# Patient Record
Sex: Female | Born: 1940 | Race: White | Hispanic: No | Marital: Married | Smoking: Never smoker
Health system: Southern US, Community
[De-identification: ages and names within clinical notes are randomized; demographics above are authoritative.]

## PROBLEM LIST (undated history)

## (undated) DIAGNOSIS — M1990 Unspecified osteoarthritis, unspecified site: Secondary | ICD-10-CM

## (undated) DIAGNOSIS — I10 Essential (primary) hypertension: Secondary | ICD-10-CM

## (undated) DIAGNOSIS — T7840XA Allergy, unspecified, initial encounter: Secondary | ICD-10-CM

## (undated) DIAGNOSIS — Z860101 Personal history of adenomatous and serrated colon polyps: Secondary | ICD-10-CM

## (undated) DIAGNOSIS — K5730 Diverticulosis of large intestine without perforation or abscess without bleeding: Secondary | ICD-10-CM

## (undated) DIAGNOSIS — G4730 Sleep apnea, unspecified: Secondary | ICD-10-CM

## (undated) DIAGNOSIS — K5792 Diverticulitis of intestine, part unspecified, without perforation or abscess without bleeding: Secondary | ICD-10-CM

## (undated) DIAGNOSIS — K219 Gastro-esophageal reflux disease without esophagitis: Secondary | ICD-10-CM

## (undated) DIAGNOSIS — H269 Unspecified cataract: Secondary | ICD-10-CM

## (undated) DIAGNOSIS — I2510 Atherosclerotic heart disease of native coronary artery without angina pectoris: Secondary | ICD-10-CM

## (undated) DIAGNOSIS — Z86010 Personal history of colonic polyps: Secondary | ICD-10-CM

## (undated) DIAGNOSIS — Z87440 Personal history of urinary (tract) infections: Secondary | ICD-10-CM

## (undated) DIAGNOSIS — I739 Peripheral vascular disease, unspecified: Secondary | ICD-10-CM

## (undated) DIAGNOSIS — E785 Hyperlipidemia, unspecified: Secondary | ICD-10-CM

## (undated) DIAGNOSIS — K649 Unspecified hemorrhoids: Secondary | ICD-10-CM

## (undated) HISTORY — DX: Unspecified cataract: H26.9

## (undated) HISTORY — DX: Diverticulitis of intestine, part unspecified, without perforation or abscess without bleeding: K57.92

## (undated) HISTORY — DX: Gastro-esophageal reflux disease without esophagitis: K21.9

## (undated) HISTORY — DX: Unspecified osteoarthritis, unspecified site: M19.90

## (undated) HISTORY — DX: Unspecified hemorrhoids: K64.9

## (undated) HISTORY — DX: Atherosclerotic heart disease of native coronary artery without angina pectoris: I25.10

## (undated) HISTORY — DX: Hyperlipidemia, unspecified: E78.5

## (undated) HISTORY — DX: Diverticulosis of large intestine without perforation or abscess without bleeding: K57.30

## (undated) HISTORY — DX: Sleep apnea, unspecified: G47.30

## (undated) HISTORY — DX: Allergy, unspecified, initial encounter: T78.40XA

## (undated) HISTORY — PX: OTHER SURGICAL HISTORY: SHX169

## (undated) HISTORY — DX: Personal history of urinary (tract) infections: Z87.440

## (undated) HISTORY — PX: ANGIOPLASTY: SHX39

## (undated) HISTORY — DX: Personal history of colonic polyps: Z86.010

## (undated) HISTORY — DX: Personal history of adenomatous and serrated colon polyps: Z86.0101

## (undated) HISTORY — DX: Peripheral vascular disease, unspecified: I73.9

## (undated) HISTORY — DX: Essential (primary) hypertension: I10

## (undated) HISTORY — PX: COLONOSCOPY: SHX174

## (undated) HISTORY — PX: EYE SURGERY: SHX253

---

## 1998-07-31 ENCOUNTER — Other Ambulatory Visit: Admission: RE | Admit: 1998-07-31 | Discharge: 1998-07-31 | Payer: Self-pay | Admitting: Obstetrics and Gynecology

## 1998-08-17 ENCOUNTER — Encounter: Payer: Self-pay | Admitting: Obstetrics and Gynecology

## 1998-08-17 ENCOUNTER — Ambulatory Visit (HOSPITAL_COMMUNITY): Admission: RE | Admit: 1998-08-17 | Discharge: 1998-08-17 | Payer: Self-pay | Admitting: Obstetrics and Gynecology

## 1999-08-27 ENCOUNTER — Encounter: Payer: Self-pay | Admitting: Obstetrics and Gynecology

## 1999-08-27 ENCOUNTER — Encounter: Admission: RE | Admit: 1999-08-27 | Discharge: 1999-08-27 | Payer: Self-pay | Admitting: Obstetrics and Gynecology

## 1999-09-19 ENCOUNTER — Encounter: Payer: Self-pay | Admitting: Internal Medicine

## 1999-10-04 ENCOUNTER — Ambulatory Visit (HOSPITAL_COMMUNITY): Admission: RE | Admit: 1999-10-04 | Discharge: 1999-10-04 | Payer: Self-pay | Admitting: Gastroenterology

## 2000-01-14 ENCOUNTER — Other Ambulatory Visit: Admission: RE | Admit: 2000-01-14 | Discharge: 2000-01-14 | Payer: Self-pay | Admitting: Obstetrics and Gynecology

## 2000-09-03 ENCOUNTER — Encounter: Admission: RE | Admit: 2000-09-03 | Discharge: 2000-09-03 | Payer: Self-pay | Admitting: Obstetrics and Gynecology

## 2000-09-03 ENCOUNTER — Encounter: Payer: Self-pay | Admitting: Obstetrics and Gynecology

## 2001-01-19 ENCOUNTER — Other Ambulatory Visit: Admission: RE | Admit: 2001-01-19 | Discharge: 2001-01-19 | Payer: Self-pay | Admitting: Obstetrics and Gynecology

## 2001-10-13 ENCOUNTER — Encounter: Payer: Self-pay | Admitting: Obstetrics and Gynecology

## 2001-10-13 ENCOUNTER — Encounter: Admission: RE | Admit: 2001-10-13 | Discharge: 2001-10-13 | Payer: Self-pay | Admitting: Internal Medicine

## 2002-10-15 ENCOUNTER — Encounter: Admission: RE | Admit: 2002-10-15 | Discharge: 2002-10-15 | Payer: Self-pay | Admitting: Obstetrics and Gynecology

## 2002-10-15 ENCOUNTER — Encounter: Payer: Self-pay | Admitting: Obstetrics and Gynecology

## 2003-10-20 ENCOUNTER — Encounter: Admission: RE | Admit: 2003-10-20 | Discharge: 2003-10-20 | Payer: Self-pay | Admitting: Obstetrics and Gynecology

## 2003-11-30 ENCOUNTER — Encounter (INDEPENDENT_AMBULATORY_CARE_PROVIDER_SITE_OTHER): Payer: Self-pay | Admitting: *Deleted

## 2003-11-30 ENCOUNTER — Ambulatory Visit (HOSPITAL_COMMUNITY): Admission: RE | Admit: 2003-11-30 | Discharge: 2003-11-30 | Payer: Self-pay | Admitting: Gastroenterology

## 2003-11-30 ENCOUNTER — Encounter (INDEPENDENT_AMBULATORY_CARE_PROVIDER_SITE_OTHER): Payer: Self-pay | Admitting: Specialist

## 2004-10-22 ENCOUNTER — Encounter: Admission: RE | Admit: 2004-10-22 | Discharge: 2004-10-22 | Payer: Self-pay | Admitting: Obstetrics and Gynecology

## 2005-10-28 ENCOUNTER — Encounter: Admission: RE | Admit: 2005-10-28 | Discharge: 2005-10-28 | Payer: Self-pay | Admitting: Obstetrics and Gynecology

## 2006-02-11 ENCOUNTER — Encounter: Payer: Self-pay | Admitting: Internal Medicine

## 2006-11-05 ENCOUNTER — Encounter: Admission: RE | Admit: 2006-11-05 | Discharge: 2006-11-05 | Payer: Self-pay | Admitting: Obstetrics and Gynecology

## 2006-11-11 ENCOUNTER — Inpatient Hospital Stay (HOSPITAL_COMMUNITY): Admission: RE | Admit: 2006-11-11 | Discharge: 2006-11-13 | Payer: Self-pay | Admitting: Cardiology

## 2006-11-27 ENCOUNTER — Encounter (HOSPITAL_COMMUNITY): Admission: RE | Admit: 2006-11-27 | Discharge: 2007-02-25 | Payer: Self-pay | Admitting: Cardiology

## 2007-05-25 ENCOUNTER — Encounter: Admission: RE | Admit: 2007-05-25 | Discharge: 2007-05-25 | Payer: Self-pay | Admitting: Cardiology

## 2007-05-27 ENCOUNTER — Inpatient Hospital Stay (HOSPITAL_COMMUNITY): Admission: RE | Admit: 2007-05-27 | Discharge: 2007-05-28 | Payer: Self-pay | Admitting: Cardiology

## 2007-11-13 ENCOUNTER — Encounter: Admission: RE | Admit: 2007-11-13 | Discharge: 2007-11-13 | Payer: Self-pay | Admitting: Obstetrics and Gynecology

## 2008-02-12 DIAGNOSIS — Z860100 Personal history of colon polyps, unspecified: Secondary | ICD-10-CM | POA: Insufficient documentation

## 2008-02-15 ENCOUNTER — Ambulatory Visit: Payer: Self-pay | Admitting: Internal Medicine

## 2008-02-15 DIAGNOSIS — E785 Hyperlipidemia, unspecified: Secondary | ICD-10-CM | POA: Insufficient documentation

## 2008-02-15 DIAGNOSIS — K5730 Diverticulosis of large intestine without perforation or abscess without bleeding: Secondary | ICD-10-CM | POA: Insufficient documentation

## 2008-02-15 DIAGNOSIS — I2510 Atherosclerotic heart disease of native coronary artery without angina pectoris: Secondary | ICD-10-CM | POA: Insufficient documentation

## 2008-02-15 DIAGNOSIS — Z87448 Personal history of other diseases of urinary system: Secondary | ICD-10-CM | POA: Insufficient documentation

## 2008-02-23 ENCOUNTER — Telehealth: Payer: Self-pay | Admitting: Internal Medicine

## 2008-03-01 ENCOUNTER — Ambulatory Visit: Payer: Self-pay | Admitting: Internal Medicine

## 2008-12-05 ENCOUNTER — Encounter: Admission: RE | Admit: 2008-12-05 | Discharge: 2008-12-05 | Payer: Self-pay | Admitting: Obstetrics and Gynecology

## 2009-03-24 DIAGNOSIS — B0229 Other postherpetic nervous system involvement: Secondary | ICD-10-CM | POA: Insufficient documentation

## 2009-03-28 DIAGNOSIS — M7740 Metatarsalgia, unspecified foot: Secondary | ICD-10-CM | POA: Insufficient documentation

## 2009-12-06 ENCOUNTER — Encounter: Admission: RE | Admit: 2009-12-06 | Discharge: 2009-12-06 | Payer: Self-pay | Admitting: Obstetrics and Gynecology

## 2010-03-16 ENCOUNTER — Ambulatory Visit: Payer: Self-pay | Admitting: Internal Medicine

## 2010-03-16 DIAGNOSIS — K625 Hemorrhage of anus and rectum: Secondary | ICD-10-CM | POA: Insufficient documentation

## 2010-04-16 DIAGNOSIS — H04129 Dry eye syndrome of unspecified lacrimal gland: Secondary | ICD-10-CM | POA: Insufficient documentation

## 2010-04-16 DIAGNOSIS — G4733 Obstructive sleep apnea (adult) (pediatric): Secondary | ICD-10-CM | POA: Insufficient documentation

## 2010-07-07 ENCOUNTER — Emergency Department (HOSPITAL_COMMUNITY)
Admission: EM | Admit: 2010-07-07 | Discharge: 2010-07-07 | Payer: Self-pay | Source: Home / Self Care | Admitting: Emergency Medicine

## 2010-07-08 ENCOUNTER — Encounter: Payer: Self-pay | Admitting: Orthopedic Surgery

## 2010-07-09 DIAGNOSIS — L299 Pruritus, unspecified: Secondary | ICD-10-CM | POA: Insufficient documentation

## 2010-07-17 NOTE — Assessment & Plan Note (Signed)
Summary: DIR COLON/PT ON PLAVIX/FH   History of Present Illness Visit Type: self referral Primary GI MD: Lina Sar MD Primary Provider: Candis Schatz Requesting Provider: n/a Chief Complaint: consult for colonoscopy History of Present Illness:   70 year old white female here to discuss  recall colonoscopy. Her brother is our patient and she decided to switch from Dr Ewing Schlein to [place]. Last colonoscopy was done in June 2005 and showed 2 adenomatous polyps of the colon. Patient has chronic constipation for which she has been taking fiber twice a day, Benefiber in the morning and Metamucil at night.  She has occasional rectal pain and rectal bleeding attributed to anal fissure. Patient has coronary artery disease; status post percutaneous coronary  intervention in May 2008  by Dr Arlice Colt. In  December 2008 she required a total of 3 coronary stents. She has been followed by Dr. Carolanne Grumbling. She is also on aspirin 325 mg daily; Patient's main complaint today is gas and bloating.   GI Review of Systems      Denies abdominal pain, acid reflux, belching, bloating, chest pain, dysphagia with liquids, dysphagia with solids, heartburn, loss of appetite, nausea, vomiting, vomiting blood, weight loss, and  weight gain.      Reports anal fissure,constipation, diverticulosis, and  rectal bleeding.     Denies black tarry stools, change in bowel habit, diarrhea, fecal incontinence, heme positive stool, hemorrhoids, irritable bowel syndrome, jaundice, light color stool, liver problems, and  rectal pain.     Prior Medications Reviewed Using: List Brought by Patient  Updated Prior Medication List: PLAVIX 75 MG TABS (CLOPIDOGREL BISULFATE) one tablet every day ZOCOR 20 MG TABS (SIMVASTATIN) 1 tablet by mouth every day [facility]S ASPIRIN 325 MG TABS (ASPIRIN) one tablet by mouth every day ULTIMATE PROBIOTIC FORMULA  CAPS (LACTOBACILLUS) once daily FISH OIL  OIL (FISH OIL) two tablets twice a day MULTIVITAMINS    TABS (MULTIPLE VITAMIN) once daily NITROLINGUAL DUO PACK 0.4 MG/SPRAY SOLN (NITROGLYCERIN) as needed NORVASC 2.5 MG TABS (AMLODIPINE BESYLATE) 1 tablet by mouth every day RESTASIS 0.05 % EMUL (CYCLOSPORINE)  METAMUCIL 30.9 % POWD (PSYLLIUM) 1 heaping tsp at bedtime  Current Allergies: No known allergies   Past Medical History:    Reviewed history from 02/12/2008 and no changes required:       Current Problems:        COLONIC POLYPS, ADENOMATOUS, HX OF (ICD-V12.72)                Past Surgical History:    Reviewed history and no changes required:       angioplasty/stent 5/08  12/08   Family History:    Reviewed history and no changes required:       Family History of Colon Polyps:       Family History of Heart Disease:        No FH of Colon Cancer:  Social History:    Reviewed history and no changes required:       Occupation: retired       Alcohol Use - yes 2-3 per week       Daily Caffeine Use 4-6 daily       Illicit Drug Use - no       Patient gets regular exercise.   Risk Factors:  Drug use:  no Alcohol use:  yes Exercise:  yes   Review of Systems  The patient denies allergy/sinus, anemia, anxiety-new, arthritis/joint pain, back pain, blood in urine, breast changes/lumps, change in vision, confusion,  cough, coughing up blood, depression-new, fainting, fatigue, fever, headaches-new, hearing problems, heart murmur, heart rhythm changes, itching, menstrual pain, muscle pains/cramps, night sweats, nosebleeds, pregnancy symptoms, shortness of breath, skin rash, sleeping problems, sore throat, swelling of feet/legs, swollen lymph glands, thirst - excessive , urination - excessive , urination changes/pain, urine leakage, vision changes, and voice change.     Vital Signs:  Patient Profile:   70 Years Old Female Height:     63 inches Weight:      162.56 pounds BMI:     28.90 Pulse rate:   68 / minute Pulse rhythm:   regular BP sitting:   126 / 72  (left  arm)  Vitals Entered By: Merri Ray CMA (February 15, 2008 1:16 PM)                  Physical Exam  General:     Well developed, well nourished, no acute distress. Neck:     Supple; no masses or thyromegaly. Lungs:     Clear throughout to auscultation. Heart:     Regular rate and rhythm; no murmurs, rubs,  or bruits. Abdomen:     Soft, nontender and nondistended. No masses, hepatosplenomegaly or hernias noted. Normal bowel sounds. Rectal:     normal rectal tone no obvious hemorrhoids. No tenderness, stool is Hemoccult negative    Impression & Recommendations:  Problem # 1:  COLONIC POLYPS, ADENOMATOUS, HX OF (ICD-V12.72) patient is due for recall colonoscopy. She was last examined in 2005. She has coronary stents and needs to stay on Plavix throughout the procedure. We have discussed the risk of bleeding on Plavix as well as the risk of taking her off Plavix and I believe she would do better staying on Plavix. We will, however discontinue aspirin 5 days prior to the procedure and will hopefully be able to restart it the next day after the procedure. Orders: Colonoscopy (Colon)   Problem # 2:  DIVERTICULOSIS OF COLON (ICD-562.10) diverticulosis of the sigmoid colon based on last colonoscopy in 2005. Patient has functional constipation. I have advised her to use over-the-counter preparations such as Senokot, Colace, or mineral oil 1 tablespoon daily or 2-3  times a week. She agrees that she will try the senokot which is more convienient for her. Orders: Colonoscopy (Colon)    Patient Instructions: 1)  over-the-counter laxatives and mild stimulants 2)  Colonoscopy scheduled 3)  Continue Plavix but discontinue aspirin 5 days prior to colonoscopy    Prescriptions: DULCOLAX 5 MG  TBEC (BISACODYL) Day before procedure take 2 at 3pm and 2 at 8pm.  #4 x 0   Entered by:   Hortense Ramal CMA   Authorized by:   Hart Carwin MD   Signed by:   Hortense Ramal CMA on  02/15/2008   Method used:   Printed then faxed to ...       [facility] (retail)       [address]       [place], [place]  [address]       Ph: [phone]       Fax: [phone]   RxID:   [id number] REGLAN 10 MG  TABS (METOCLOPRAMIDE HCL) As per prep instructions.  #2 x 0   Entered by:   Hortense Ramal CMA   Authorized by:   Hart Carwin MD   Signed by:   Hortense Ramal CMA on 02/15/2008   Method used:  Printed then faxed to ...       [facility] (retail)       [address]       [place], [place]  [address]       Ph: [phone]       Fax: [phone]   RxID:   [id number] MIRALAX   POWD (POLYETHYLENE GLYCOL 3350) As per prep  instructions.  #255gm x 0   Entered by:   Hortense Ramal CMA   Authorized by:   Hart Carwin MD   Signed by:   Hortense Ramal CMA on 02/15/2008   Method used:   Printed then faxed to ...       [facility] (retail)       [address]       [place], [place]  [address]       Ph: [phone]       Fax: [phone]   RxID:   [id number]  ]

## 2010-07-17 NOTE — Procedures (Signed)
Summary: [facility])  Gastroenterology [facility])   Imported By: Hortense Ramal CMA 02/15/2008 17:50:31  _____________________________________________________________________  External Attachment:    Type:   Image     Comment:   External Document

## 2010-07-17 NOTE — Assessment & Plan Note (Signed)
Summary: blood in stool....ch   History of Present Illness Primary GI MD: Lina Sar MD Primary Provider: Candis Schatz Requesting Provider: n/a Chief Complaint: RLQ intermittant abd pain and BRB in stool x 1 year. Pt states she has problems with constipation even when taking Metamucil and stool softeners qd.  History of Present Illness:   This is a 70 year old white female with a history of colon polyps and intermittent bright red blood per rectum for one year. She is on Plavix and aspirin for coronary artery disease and is status post stent placements x 3 in 2008. She has a history of an anal fissure. She experiences rectal pain with bowel movements. She has constipation and difficult evacuation. She has taken Colace 100 mg a day and Metamucil 1 teaspoon twice a day. She tries to avoid OTC laxatives. She uses chewable fiber and takes prunes. Her last colonoscopy in September 2009 showed moderate-to-severe diverticulosis. A prior colonoscopy in 2005 showed 2 tubular adenomas.   GI Review of Systems    Reports abdominal pain.     Location of  Abdominal pain: RLQ.    Denies acid reflux, belching, bloating, chest pain, dysphagia with liquids, dysphagia with solids, heartburn, loss of appetite, nausea, vomiting, vomiting blood, weight loss, and  weight gain.      Reports constipation and  rectal bleeding.     Denies anal fissure, black tarry stools, change in bowel habit, diarrhea, diverticulosis, fecal incontinence, heme positive stool, hemorrhoids, irritable bowel syndrome, jaundice, light color stool, liver problems, and  rectal pain.    Current Medications (verified): 1)  Plavix 75 Mg Tabs (Clopidogrel Bisulfate) .... One Tablet Every Day 2)  Cvs Aspirin 325 Mg Tabs (Aspirin) .... One Tablet By Mouth Every Day 3)  Ultimate Probiotic Formula  Caps (Lactobacillus) .... Once Daily 4)  Fish Oil  Oil (Fish Oil) .... Two Tablets Twice A Day 5)  Multivitamins   Tabs (Multiple Vitamin) .... Once  Daily 6)  Nitrolingual Duo Pack 0.4 Mg/spray Soln (Nitroglycerin) .... As Needed 7)  Norvasc 2.5 Mg Tabs (Amlodipine Besylate) .Marland Kitchen.. 1 Tablet By Mouth Every Day 8)  Restasis 0.05 % Emul (Cyclosporine) 9)  Metamucil 30.9 % Powd (Psyllium) .Marland Kitchen.. 1 Heaping Tsp At Bedtime 10)  Crestor 40 Mg Tabs (Rosuvastatin Calcium) .... One Tablet By Mouth Once Daily 11)  Stool Softener 100 Mg Caps (Docusate Sodium) .... One Capsule By Mouth Once Daily  Allergies (verified): No Known Drug Allergies  Past History:  Past Medical History: UTI'S, HX OF (ICD-V13.00) HYPERLIPIDEMIA (ICD-272.4) DIVERTICULOSIS OF COLON (ICD-562.10) CAD (ICD-414.00) COLONIC POLYPS, ADENOMATOUS, HX OF (ICD-V12.72)  Sleep apnea  Past Surgical History: Reviewed history from 02/15/2008 and no changes required. angioplasty/stent 5/08  12/08  Family History: Family History of Colon Polyps:Brother, Paternal Grandfather Family History of Heart Disease: Father, Brothers, Gearldine Shown, Mother No FH of Colon Cancer:  Social History: Reviewed history from 02/15/2008 and no changes required. Occupation: retired Alcohol Use - yes 2-3 per week Daily Caffeine Use 4-6 daily Illicit Drug Use - no Patient gets regular exercise.  Review of Systems  The patient denies allergy/sinus, anemia, anxiety-new, arthritis/joint pain, back pain, blood in urine, breast changes/lumps, change in vision, confusion, cough, coughing up blood, depression-new, fainting, fatigue, fever, headaches-new, hearing problems, heart murmur, heart rhythm changes, itching, menstrual pain, muscle pains/cramps, night sweats, nosebleeds, pregnancy symptoms, shortness of breath, skin rash, sleeping problems, sore throat, swelling of feet/legs, swollen lymph glands, thirst - excessive , urination - excessive , urination changes/pain, urine leakage,  vision changes, and voice change.         Pertinent positive and negative review of systems were noted in the above HPI. All  other ROS was otherwise negative.   Vital Signs:  Patient profile:   70 year old female Height:      63 inches Weight:      168.50 pounds BMI:     29.96 Pulse rate:   70 / minute Pulse rhythm:   regular BP sitting:   118 / 70  (left arm) Cuff size:   regular  Vitals Entered By: Christie Nottingham CMA Duncan Dull) (March 16, 2010 8:35 AM)  Physical Exam  General:  Well developed, well nourished, no acute distress. Neck:  Supple; no masses or thyromegaly. Lungs:  Clear throughout to auscultation. Heart:  Regular rate and rhythm; no murmurs, rubs,  or bruits. Abdomen:  Soft, nontender and nondistended. No masses, hepatosplenomegaly or hernias noted. Normal bowel sounds. Rectal:  rectal and anoscopic exam reveals a normal perianal area. The anal canal showed minor fissures circumferentially; 1 at 12:00 and 1 at 9:00 which were bleeding slightly when the anoscope was inserted. The were no deep fissures. There were no significant hemorrhoids. The dentate line was somewhat irregular and had hypertrophic papillae. There was no stool in the ampulla. Extremities:  No clubbing, cyanosis, edema or deformities noted.   Impression & Recommendations:  Problem # 1:  RECTAL BLEEDING (ICD-569.3) Patient has low-volume bright red blood per rectum most likely from a superficial anal fissure. Patient has been anticoagulated with Plavix and aspirin. She is up-to-date on her colonoscopy. We will start her on Analpram cream 2.5% b.i.d. and p.r.n. and continue the bowel regimen consisting of Colace 100 mg twice a day, Metamucil 1 teaspoon twice a day, prune juice and fiber.  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  Problem # 3:  CAD (ICD-414.00) Patient is followed by a cardiologist outside of our group. Patient inquired about switching to our cardiology group.  Patient Instructions: 1)  Analpram cream 2.5% b.i.d. and  p.r.n. 2)  Colace 100 mg p.o. b.i.d. 3)  Metamucil 1 teaspoon b.i.d. 4)  High-fiber diet. 5)  Recall colonoscopy September 2014. 6)  Copy sent to : Dr Jessee Avers 7)  The medication list was reviewed and reconciled.  All changed / newly prescribed medications were explained.  A complete medication list was provided to the patient / caregiver. Prescriptions: ANALPRAM-HC 1-2.5 % CREA (HYDROCORTISONE ACE-PRAMOXINE) Apply to rectum two times a day and as needed  #30 grams x 2   Entered by:   Lamona Curl CMA (AAMA)   Authorized by:   Hart Carwin MD   Signed by:   Lamona Curl CMA (AAMA) on 03/16/2010   Method used:   Faxed to ...       [facility] (retail)       [address]       [place], [place]  [address]       Ph: [phone] or [phone]       Fax: [phone]   RxID:   [id number]

## 2010-07-17 NOTE — Progress Notes (Signed)
Summary: needs rx resend   Phone Note Call from Patient Call back at Home Phone [phone]   Caller: Patient Call For: brodie Summary of Call: pt having col on the 15th and rx was sent to the wrong pharmacy. pt states tha tshe never gave [place] that pharmacy; [facility] [phone] Initial call taken by: Tawni Levy,  February 23, 2008 3:06 PM  Follow-up for Phone Call        Rx Called In.  Patient notified.  Apoligized for the inconvience.  Advised her that for some reason, another pharmacy was listed as her preferred.  Patient was very understanding. Follow-up by: Hortense Ramal CMA,  February 23, 2008 4:15 PM      Prescriptions: DULCOLAX 5 MG  TBEC (BISACODYL) Day before procedure take 2 at 3pm and 2 at 8pm.  #4 x 0   Entered by:   Hortense Ramal CMA   Authorized by:   Hart Carwin MD   Signed by:   Hortense Ramal CMA on 02/23/2008   Method used:   Printed then faxed to ...       [facility] (mail-order)       [address]       [place], [place]  [address]       Ph: [phone] or [phone]       Fax: [phone]   RxID:   [id number] REGLAN 10 MG  TABS (METOCLOPRAMIDE HCL) As per prep instructions.  #2 x 0   Entered by:   Hortense Ramal CMA   Authorized by:   Hart Carwin MD   Signed by:   Hortense Ramal CMA on 02/23/2008   Method used:   Printed then faxed to ...       [facility] (mail-order)       [address]       [place], [place]  [address]       Ph: [phone] or [phone]       Fax: [phone]   RxID:   [id number] MIRALAX   POWD (POLYETHYLENE GLYCOL 3350) As per prep  instructions.  #255 grams x 0   Entered by:   Hortense Ramal CMA   Authorized by:   Hart Carwin MD   Signed by:   Hortense Ramal CMA on 02/23/2008   Method used:   Printed then faxed to ...       [facility] (mail-order)       [address]       [place], [place]  [address]       Ph: [phone] or [phone]       Fax: [phone]   RxID:   [id number]

## 2010-07-17 NOTE — Procedures (Signed)
Summary: Colonoscopy   Colonoscopy  Procedure date:  03/01/2008  Findings:      Location:  [facility].    Procedures Next Due Date:    Colonoscopy: 02/2013  Patient Name: [name], [name] MRN:  Procedure Procedures: Colonoscopy CPT: 32951.  Personnel: Endoscopist: Dora L. Juanda Chance, MD.  Exam Location: Exam performed in Outpatient Clinic. Outpatient  Patient Consent: Procedure, Alternatives, Risks and Benefits discussed, consent obtained, from patient. Consent was obtained by the RN.  Indications  Surveillance of: Adenomatous Polyp(s). Initial polypectomy was performed in 2005. 1-2 Polyps were found at Index Exam. Largest polyp removed was 1 to 5 mm. Pathology of worst  polyp: tubular adenoma.  History  Current Medications: Patient is on an anticoagulant. Patient is not currently taking Coumadin.  Pre-Exam Physical: Performed Mar 01, 2008. Entire physical exam was normal.  Comments: Pt. history reviewed/updated, physical exam performed prior to initiation of sedation?yes Exam Exam: Extent of exam reached: Cecum, extent intended: Cecum.  The cecum was identified by appendiceal orifice and IC valve. Duration of exam: time in 8:34 min, out 5:15 min minutes. Colon retroflexion performed. Images taken. ASA Classification: II. Tolerance: good.  Monitoring: Pulse and BP monitoring, Oximetry used. Supplemental O2 given.  Colon Prep Used Miralax for colon prep. Prep results: good.  Sedation Meds: Patient assessed and found to be appropriate for moderate (conscious) sedation. Fentanyl 100 mcg. given IV. Versed 7 mg. given IV.  Findings - NORMAL EXAM: Cecum.  - DIVERTICULOSIS: Descending Colon to Sigmoid Colon. ICD9: Diverticulosis: 562.10. Comments: moderately severe diverticulosis of the left colon.  - NORMAL EXAM: Rectum.   Assessment Normal examination.  Diagnoses: 562.10: Diverticulosis.   Comments: no recurrent polyps Events  Unplanned  Interventions: No intervention was required.  Unplanned Events: There were no complications. Plans Patient Education: Patient given standard instructions for: Constipation.Patient instructed to get routine colonoscopy every 5-7 years.  Comments: follow up with Dr Timothy Lasso prn, resume Plavix and ASA Disposition: After procedure patient sent to recovery. After recovery patient sent home.     cc.   Jonny Ruiz Russo,MD   This report was created from the original endoscopy report, which was reviewed and signed by the above listed endoscopist.

## 2010-07-17 NOTE — Procedures (Signed)
Summary: COLON (Dr. Ewing Schlein)   Colonoscopy  Procedure date:  11/30/2003  Findings:      Location:  [facility]    NAME:  [name], [name]                       ACCOUNT NO.:  [id number]   MEDICAL RECORD NO.:  [id number]                   PATIENT TYPE:  AMB   LOCATION:  ENDO                                 FACILITY[facility]  [facility]   PHYSICIAN:  Petra Kuba, M.D.                 DATE OF BIRTH:  09/25/1940   DATE OF PROCEDURE:  11/30/2003  DATE OF DISCHARGE:                                 OPERATIVE REPORT   PROCEDURE:  Colonoscopy.   ENDOSCOPIST:  Petra Kuba, M.D.   INDICATIONS:  Family history for colon polyps.  Personal history of colon  polyps, due for colonic screening.  Consent was signed after risks,  benefits, methods and options thoroughly discussed multiple times in the  past.   MEDICATIONS USED:  Demerol 70 mcg, Versed 6 mg.   DESCRIPTION OF PROCEDURE:  The rectal inspection was pertinent for external  hemorrhoids.  Digital exam was negative.  A video pediatric adjustable  colonoscope was inserted; easily advanced to the approximate level of the  splenic flexure.  Left-sided diverticula were seen.  To advance into the  cecum requiring some abdominal pressure, but no position changes.  No other  abnormality was seen on insertion.   The cecum was identified by the appendiceal orifice and ileocecal valve.  In  fact, the scope was inserted a short ways into the terminal ileum which was  normal further documentation was obtained.  The scope was slowly withdrawn.  The prep was adequate. There was minimal liquid stool that required washing  and suctioning.  On slow withdrawal through the colon the cecum and  ascending were normal. At approximately the level of the hepatic flexure a  small polyp behind a fold was seen and snared; electrocautery applied.  The  polyp was suctioned through the scope and collected in the trap.   Another mid-transverse small polyp was  seen.  Snare electrocautery applied  and this polyp was suctioned through the scope and collected in the trap;  submitted in the same container.  The scope was further withdrawn.  There  was a tiny descending polyp and a thin distal sigmoid polyp which were hot  biopsied, put in a second container and other than the left-sided  diverticula and some tortuosity no other abnormalities were seen.  The  anorectal posterior retroflexion did confirm some hemorrhoids.  The scope  was inserted up the left side of the colon, the air was suctioned and the  scope removed.  The patient tolerated the procedure well.  There were no  obvious complication.   ENDOSCOPIC DIAGNOSES:  1. Internal and external hemorrhoids.  2. Left-sided diverticula and tortuosity.  3. Two tiny sigmoid and descending polyp hot biopsied.  4. Two tiny to small transverse hepatic flexure polyps  snared.  5. Otherwise normal limits to the terminal ileum.   PLAN:  1. Await pathology to determine future colonic screening, probably 3-5     years.  2. Consider ultrasound or CT scan one time if her pain continues.  3. May give her an antispasmodic like Levsin, Levbid, Bentyl, etc., to try     for her pain and have her seen back p.r.n.; otherwise we will leave     further workup and plans Dr. Purnell Shoemaker and Asencion Islam.  This report was created from the original endoscopy report, which was reviewed and signed by the above listed endoscopist.                                               Petra Kuba, M.D.  PATHOLOGY   MEM/MEDQ  D:  11/30/2003  T:  11/30/2003  Job:  [id number]   cc:   Petra Kuba, M.D.  [address]  [place]  [place] [address]  Fax: [phone]   Lianne Bushy, M.D.  [address]  [place]  [place] [address]  Fax: [phone]   Lanora Manis A. Asencion Islam, M.D.  [address]  [place]  [place] [address]  Fax: [phone]  FINAL DIAGNOSIS    ***MICROSCOPIC EXAMINATION AND DIAGNOSIS***    1. COLON, RIGHT SIDE, POLYP:  TUBULAR ADENOMAS. NO HIGH GRADE   DYSPLASIA OR MALIGNANCY IDENTIFIED.    2. COLON, LEFT SIDE, POLYP: TUBULAR ADENOMAS. NO HIGH GRADE   DYSPLASIA OR MALIGNANCY IDENTIFIED.    mw   Date Reported: 12/02/2003 Beulah Gandy. Luisa Hart, MD   *** Electronically Signed Out By JDP ***    Clinical information   (ac)    specimen(s) obtained   1: Colon, polyp(s), right side   2: Colon, polyp(s), left side    Gross Description   1. Received in formalin are tan, soft tissue fragments that are   submitted in toto. Number: 2   Size: 0.2 and 0.3cm, one block    2. Received in formalin are tan, soft tissue fragments that are   submitted in toto. Number: 3   Size: 0.1 to 0.2cm, one block. (SW:gt) 12/01/03

## 2010-07-17 NOTE — Procedures (Signed)
Summary: [facility])  [facility])   Imported By: Hortense Ramal CMA 02/15/2008 17:49:24  _____________________________________________________________________  External Attachment:    Type:   Image     Comment:   External Document

## 2010-10-18 ENCOUNTER — Telehealth: Payer: Self-pay

## 2010-10-18 NOTE — Telephone Encounter (Addendum)
Mailed Release Of Info. To Pt  10/18/10/km   Release Rec'd back from Pt Via Mail, Faxed to [facility]/Dr.Traci Turner @ [phone] Pt needs to make NP Appt with [place]. 10/24/10/km

## 2010-10-29 ENCOUNTER — Other Ambulatory Visit: Payer: Self-pay | Admitting: Obstetrics and Gynecology

## 2010-10-29 DIAGNOSIS — Z1231 Encounter for screening mammogram for malignant neoplasm of breast: Secondary | ICD-10-CM

## 2010-10-30 NOTE — Discharge Summary (Signed)
NAME:  [name], [name]             ACCOUNT NO.:  [id number]   MEDICAL RECORD NO.:  [id number]          PATIENT TYPE:  OIB   LOCATION:  6529                         FACILITY:  [facility]   PHYSICIAN:  Quintella Reichert, M.D.   DATE OF BIRTH:  [birth year]   DATE OF ADMISSION:  11/11/2006  DATE OF DISCHARGE:  11/13/2006                               DISCHARGE SUMMARY   ADMISSION DIAGNOSIS:  Angina.   DISCHARGE DIAGNOSES:  1. Angina.  Status post cardiac catheterization with PCI (percutaneous      coronary intervention) and drug-eluting stent implantation to the      mid left anterior descending artery and proximal first diagonal.      Stable without angina.  2. Dyslipidemia.   CONSULTATIONS:  None.   PROCEDURES:  1. Nov 11, 2006 - Left heart catheterization, coronary angiography,      and left ventriculography.  2. Nov 12, 2006 - PCI with drug-eluting stent implantation to the mid      LAD and proximal first diagonal.   HOSPITAL COURSE:  [name] is a 70 year old Caucasian female with no  known history of coronary artery disease.  She was admitted to the [facility] on Nov 11, 2006 for a cardiac catheterization procedure  secondary to classic angina symptoms.  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  The patient was noted to have normal LV function.  On the  following day she underwent PCI/drug-eluting stent implantation to the  mid LAD and proximal first diagonal.  The PCI procedure was performed by  Dr. Corliss Marcus and was well tolerated by the patient with a minimal  estimated blood loss.  Typical angina was reproduced with device  insertion and balloon inflation.  The lesion treated was in the mid  anterior descending artery and the proximal portion of a large diagonal.  The anterior descending artery lesion was diffuse and 90% stenotic at  its tightest portion.  Following balloon dilatation and stent  implantation there was no residual stenosis.  In the diagonal branch,  there was focal 95% stenosis.  Following balloon dilatation and stent  implantation there was a 10% residual stenosis.  The patient was started  on Plavix 75 mg daily.  Her aspirin was increased to 325 mg daily.  During this admission she was started on Toprol XL 25 mg daily.  However, that was discontinued prior to discharge secondary to  bradycardia.  A 12-lead EKG post-procedure revealed normal sinus rhythm  with a ventricular rate of 72 beats per minute.  There were no acute  ischemic changes.  On Nov 13, 2006 the patient had no further complaints  of angina, shortness of breath, nausea, vomiting, dizziness, or  diaphoresis after ambulating with cardiac rehab.  She was seen and  examined by Dr. Armanda Magic prior to discharge who was in agreement  with the discharge decision.  Her groin was stable without evidence of  ecchymosis, hematoma, or pseudoaneurysm.  She was discharged to home in  stable condition and was started on Lipitor 10 mg daily secondary to  dyslipidemia.  She was instructed to discontinue Tricor and red yeast  rice.   LABORATORY DATA:  White blood count 11.3, hemoglobin 12.0, hematocrit  35.0, platelets 290,000.  Unfractionated heparin level 0.71.  Sodium  141, potassium 3.8, chloride 107, CO2 26, glucose 100, BUN 7, creatinine  0.89.  Fasting lipid panel - total cholesterol 170, triglycerides 59,  HDL 48, LDL 110.   X-RAYS:  None during this admission.   EKGs as stated in the hospital course.   DISCHARGE MEDICATIONS:  1. Coated aspirin 325 mg daily.  This represented an increase in      dosage.  2. Plavix 75 mg daily.  This represented a new prescription.  A      prescription was provided with refills.  3. Lipitor 10 mg daily.  This represented a new prescription.  A      prescription was provided with refills.  4. Fish oil 1 tablet three times daily.  5. Multivitamin 1 tablet daily.  6.  Calcium plus vitamin D daily.  7. Probiotics.  8. Vitamin C.   DISCHARGE INSTRUCTIONS:  1. Increase activity slowly.  2. No lifting greater than 10 pounds for 1 week.  3. No driving for two days.  4. Continue a low-sodium, low-fat, and low-cholesterol diet.  5. Gently bathe groin area with warm soap and water.  Do not scrub      area.  Avoid straining.  6. Stop any activity that causes chest pain, shortness of breath,      dizziness, sweating, or excessive weakness.  7. Discontinue Tricor and red yeast rice.   FOLLOW UP ARRANGEMENTS:  1. Appointment with physician assistant at [facility] cardiology for groin      check on June 5 at 1 o'clock p.m.  2. Appointment with Dr. Armanda Magic at [facility] cardiology on June 23 at      2 o'clock p.m..  The patient is scheduled to call [phone] if she      is unable to make either of these scheduled appointments.  3. Fasting lipid panel in six weeks on July 10 anytime between 7:30      a.m. and 5:30 p.m..  Nothing to eat or drink by mouth after      midnight the night before with fasting lab work.     ______________________________  Tylene Fantasia, PA    ______________________________  Quintella Reichert, M.D.    RDM/MEDQ  D:  11/13/2006  T:  11/13/2006  Job:  [id number]   cc:   Creola Corn, M.D.

## 2010-10-30 NOTE — Cardiovascular Report (Signed)
NAME:  [name], [name]             ACCOUNT NO.:  [id number]   MEDICAL RECORD NO.:  [id number]          PATIENT TYPE:  OIB   LOCATION:  2854                         FACILITY:  [facility]   PHYSICIAN:  Armanda Magic, M.D.     DATE OF BIRTH:  December 13, 1940   DATE OF PROCEDURE:  11/11/2006  DATE OF DISCHARGE:                            CARDIAC CATHETERIZATION   PROCEDURES:  1. Left heart catheterization.  2. Coronary angiography.  3. Left ventriculography.   OPERATOR:  Armanda Magic, M.D.   COMPLICATIONS:  None.   IV ACCESS:  Via right femoral artery 6-French sheath.   IV MEDICATIONS:  Versed 2 mg IV.   HISTORY:  This is a 70 year old white female who presented recently with  classic exertional anginal symptoms.  No chest pain, but exertional left  arm pain radiating up into her left jaw -- resolving with rest.  She now  presents for cardiac catheterization.   DESCRIPTION OF PROCEDURE:  The patient was brought to cardiac  catheterization laboratory in a fasting nonsedated state.  Informed  consent was obtained.  The patient was connected to continuous heart  rate and pulse oximetry monitoring and blood pressure monitoring.  The  right groin was prepped and draped in a sterile fashion.  Xylocaine 1%  was used for local anesthesia.  Using a modified Seldinger technique, a  6-French sheath was placed in the right femoral artery.  Under  fluoroscopic guidance, a 6-French JL-4 catheter was placed in the left  coronary artery.  Multiple cineangiography films were taken at 30-degree  RAO and 40-degree LAO views.  This catheter was then exchanged out over  a guidewire for a  6-French JR-4 catheter, which was placed under  fluoroscopic guidance into the right coronary artery.  Multiple  cineangiography films were taken at 30-degree RAO and 40-degreee LAO  views.  This catheter was then exchanged out over a guidewire for a 6-  French angled pigtail catheter, which was placed under fluoroscopic  guidance into the left ventricular cavity.   Left ventriculography was performed in the 30-degree RAO view, using a  total of 30 mL of contrast at 15 mL per second.  The catheter was then  pulled back across the aortic valve, with no significant gradient noted.   At the end of the procedure all catheters and sheaths were removed.  Manual compression was performed until adequate hemostasis was obtained.  The patient was transferred back to her room in stable condition.   RESULTS:  1. The left main coronary artery is widely patent and bifurcates to      the left anterior descending artery and left circumflex artery.  2. The left anterior descending artery is widely patent in the      proximal portion, giving rise to a large first diagonal branch --      which has a 99% proximal stenosis; it then bifurcates distally into      2 daughter branches, both of which are widely patent.  Just distal      to the takeoff of the first diagonal there is a 50% narrowing of  the LAD, and an 80% stenosis of the mid LAD.  The proximal to mid      LAD and the rest of the LAD traversed the apex and is widely      patent.  3. The left circumflex is widely patent throughout its course, giving      rise to a first obtuse marginal branch which is widely patent and      bifurcates into 2 daughter branches.  The ongoing left circumflex      traverses the AV groove and is widely patent; gives rise to a      second obtuse marginal branch, which is widely patent and      bifurcates into 2 daughter branches -- both of which are widely      patent.  4. The right coronary artery has a 50% proximal stenosis, and then a      70-80% mid stenosis; it then bifurcates distally to the posterior      descending artery and posterolateral artery -- both of which are      widely patent.  5. Left ventriculography shows normal LV function, EF 60%.  Left      ventricular pressure 156/9 mmHg.  Aortic pressure 142/74 mmHg.    ASSESSMENT:  1. Exertional angina.  2. Two-vessel obstructive coronary disease.  3. Normal left ventricular function.   PLAN:  PCI of the LAD diagonal on Nov 12, 2006 by Dr. Amil Amen.  Plavix,  aspirin, IV heparin drip and nitroglycerin drip.  Toprol XL 25 mg a day,  and check a fasting statin panel and a BMET in the a.m.      Armanda Magic, M.D.  Electronically Signed     TT/MEDQ  D:  11/11/2006  T:  11/12/2006  Job:  [id number]   cc:   Gwen Pounds, MD

## 2010-10-30 NOTE — Cardiovascular Report (Signed)
[name], [name]             ACCOUNT NO.:  [id number]   MEDICAL RECORD NO.:  [id number]          PATIENT TYPE:  INP   LOCATION:  6527                         FACILITY:  [facility]   PHYSICIAN:  Armanda Magic, M.D.     DATE OF BIRTH:  [birth year]   DATE OF PROCEDURE:  05/27/2007  DATE OF DISCHARGE:                            CARDIAC CATHETERIZATION   REFERRING PHYSICIAN:  Gwen Pounds, MD   PROCEDURES:  1. Left heart catheterization.  2. Coronary angiography.  3. Left ventriculography.   OPERATOR:  Armanda Magic, M.D.   INDICATIONS:  Chest pain and coronary disease.   COMPLICATIONS:  None.   IV ACCESS:  Via right femoral artery, 6-French sheath.   IV MEDICATIONS:  Versed 2 mg, fentanyl 25 mcg.   This a 70 year old female with a history of coronary disease status post  PTCA and stenting of the LAD and proximal first diagonal.  She did have  residual borderline obstructive disease in the RCA.  She presented  recently with recurrence of pain, although not her typical angina which  usually occurs in her jaw. This basically it was exertional left arm  pain.  She underwent stress Cardiolite study which showed no inducible  ischemia, but, because of persistence of exertional arm pain, she  presents for cardiac catheterization.   The patient is brought to cardiac catheterization laboratory in the  fasting nonsedated state.  Informed consent was obtained.  The patient  was connected to continuous heart rate and pulse oximetry monitoring,  intermittent blood pressure monitoring.  The right groin was prepped and  draped in a sterile fashion; 1% Xylocaine was used for local anesthesia.  Using modified Seldinger technique, a 6-French sheath was placed in the  right femoral artery.  Under fluoroscopic guidance, a 6-French JL-4  catheter was placed in the left coronary artery.  Multiple cine films  were taken at 30-degree RAO, 40-degree LAO views.  This catheter was  exchanged out over a  guidewire for a 6-French JR-4 catheter which was  placed under fluoroscopic guidance in the right coronary artery.  Multiple cine films were taken at 30-degree RAO, 40-degree LAO views.  This catheter was then exchanged out over a guidewire for a 6-French  angled pigtail catheter which was placed under fluoroscopic guidance in  the left ventricular cavity.  Left ventriculography was performed in the  30-degree RAO view using a total of 30 mL of contrast at 15 mL per  second.  The catheter was then pulled back across the aortic valve with  no significant gradient noted.  At the end procedure, the patient  underwent PCI of the RCA by Dr.  Amil Amen.   The left coronary artery is widely patent.  It bifurcates in left  anterior descending artery and left circumflex artery.   The left anterior descending artery is patent in its proximal portion.  It then gives rise to a first diagonal which has a stent present in the  proximal portion. There appears to be 40-50% in-stent restenosis.  Just  after the takeoff of the diagonal, there is evidence of a stent in the  mid LAD with 30-40% in-stent restenosis.  The ongoing LAD traverses the  apex and is widely patent.   Left circumflex is widely patent throughout its course, giving rise to  two obtuse marginal branches.  The first obtuse marginal branch  bifurcates into two daughter branches and is widely patent. The second  obtuse marginal branch bifurcates into two daughter branches and is  widely patent.  The ongoing circumflex traverses the AV groove and is  widely patent.   The right coronary artery has a 50% proximal narrowing and then a 95%  lesion in the mid RCA. The distal RCA bifurcates into a posterior  descending artery and posterolateral artery both of which are widely  patent.   Aortic pressure 123/69 mmHg, LV pressure 110/12 mmHg. LV function is  normal, EF 65%   ASSESSMENT:  1. One-vessel obstructive coronary disease in the right  coronary      artery.  Patent left anterior descending stent with 30-40% in-stent      restenosis and about 40-50% in-stent restenosis of the diagonal.  2. Normal left ventricular function.  3. Dyslipidemia.   PLAN:  PCI of the RCA per Dr. Amil Amen. Continue aspirin and Plavix.      Armanda Magic, M.D.  Electronically Signed     TT/MEDQ  D:  05/27/2007  T:  05/27/2007  Job:  [id number]   cc:   Gwen Pounds, MD

## 2010-10-30 NOTE — Discharge Summary (Signed)
[name], [name]             ACCOUNT NO.:  [id number]   MEDICAL RECORD NO.:  [id number]          PATIENT TYPE:  INP   LOCATION:  6527                         FACILITY:  [facility]   PHYSICIAN:  Armanda Magic, M.D.     DATE OF BIRTH:  07-16-1940   DATE OF ADMISSION:  05/27/2007  DATE OF DISCHARGE:  05/28/2007                               DISCHARGE SUMMARY   DISCHARGE DIAGNOSES:  1. Coronary artery disease status post drug-eluting stent to the mid      right coronary artery on May 27, 2007, by Dr. Corliss Marcus.  2. Dyslipidemia, resume Statin.  3. Long term medication use.   [name] is a 70 year old female with known coronary artery disease  who underwent PCI of the LAD and the first diagonal in May 2008.  She  had residual borderline obstructive disease of the right coronary  artery.  She was seen in the office on May 26, 2007, for pre-cardiac  catheterization workup.  She had been having more left arm discomfort  and chest aching.  Ultimately, the patient's right cardiac  catheterization was found to have a 90% proximal to mid right coronary  artery lesion and a drug-eluting stent was implanted by Dr. Corliss Marcus.  The patient remained in the hospital overnight and was ready  for discharge to home the following day.   DISCHARGE MEDICATIONS:  1. Simvastatin 20 mg one p.o. daily (restart).  2. The patient is on multiple vitamin supplements including:      a.     Probiotic daily.      b.     Vitamin C daily.      c.     Vitamin D daily.      d.     Calcium daily.      e.     Magnesium daily.  3. Plavix 75 mg a day.  4. Enteric coated aspirin 325 mg a day.  5. Sublingual nitroglycerin p.r.n. chest pain  6. Fish oil as prior to admission.   Clean cath site gently with soap and water, no scrubbing, increase  activity slowly, no lifting over 10 pounds for one week, remain on a low  sodium heart-healthy diet.   Follow up with [name], N.P. for a groin  check on June 04, 2007 at 9 a.m. and then follow up with Dr. Armanda Magic for a followup  visit, post hospital, on July 02, 2007 at 12:30 p.m.      Guy Franco, P.A.      Armanda Magic, M.D.  Electronically Signed    LB/MEDQ  D:  05/28/2007  T:  05/28/2007  Job:  [id number]   cc:   Armanda Magic, M.D.

## 2010-10-30 NOTE — Cardiovascular Report (Signed)
NAME:  [name], [name]             ACCOUNT NO.:  [id number]   MEDICAL RECORD NO.:  [id number]          PATIENT TYPE:  OIB   LOCATION:  4799                         FACILITY:  [facility]   PHYSICIAN:  Francisca December, M.D.  DATE OF BIRTH:  12-27-1940   DATE OF PROCEDURE:  DATE OF DISCHARGE:                            CARDIAC CATHETERIZATION   PROCEDURE PERFORMED:  1. PCI slash drug-eluting stent implantation, mid-LAD.  2. PCI slash drug-eluting stent implantation, proximal first diagonal.   INDICATIONS:  [name] is a 70 year old woman who has presented  with typical angina and a positive Cardiolite study for anterior  ischemia. Dr. French Ana Turner's completed coronary angiography revealing a  subtotal stenosis of the anterior descending artery and a large first  diagonal branch.  She is to undergo catheter-based revascularization at  this time.   PROCEDURE NOTE:  The patient was brought to the cardiac catheterization  laboratory in a fasting state.  The right groin was prepped and draped  in the  usual sterile fashion.  Local anesthesia was obtained with the  infiltration of 1% lidocaine.  A 6-French catheter sheath was inserted  percutaneously into the right femoral artery, utilizing an anterior  approach over guiding J-wire.  The patient then received a 0.75 mg/kg  bolus of bivalirudin, followed by a 1.75 mg kilogram constant infusion.  The resultant ACT was greater than 300 seconds.   A 6-French number 3.5 CLS guiding catheter was advanced into anterior  descending artery without difficulty.  The lesion was primarily stented  using a 3.0/24-mm Medtronic Endeavor intracoronary stent.  This was  inflated to 12 atmospheres for 20 seconds.  The stent balloon was  deflated and removed, and post dilatation performed with a 3.25/20-mm  Quantum Maverick intracoronary balloon.  This was placed within the  stented segment, carefully positioned and inflated 22 atmospheres for 22  seconds.   This balloon was deflated and removed, and following  confirmation of adequate patency and orthogonal views, the guidewire was  withdrawn.  It was re-inserted into the diagonal branch.  Pre-dilatation  of this lesion was performed with a 2.5/15-mm Scimed Maverick  intracoronary balloon.  This was inflated to 6 atmospheres for 45  seconds.  This balloon was deflated and removed, and a 3.0/12-mm  Medtronic endeavor intracoronary drug-eluting stent was then advanced  into place, carefully positioned and deployed a peak pressure of 12  atmospheres for 42 seconds.  Post dilatation was then performed using a  3.0/8-mm Quantum Maverick.  This was carefully positioned within the  stented segment and inflated to 22 atmospheres with 34 seconds.  There  was still residual waste.  I, therefore, exchanged this balloon for a  3.2 5/18-mm Power Sail.  This device was carefully positioned and  inflated to 18 atmospheres for 58 seconds.  Finally, I withdrew the wire  from the diagonal and re-advanced it into the LAD.  The 3.25/20-mm  Quantum Maverick was returned to the circulation and placed within the  more proximal portion of the LAD-stented segment.  It was inflated to 12  atmospheres for 24 seconds and then removed.  Following  confirmation of  adequate patency in orthogonal views, both with and without the  guidewire in place, the guiding catheter was removed.  A right femoral  arteriogram in the 45-degree RAO angulation at documented adequate  anatomy for placement of percutaneous closure device Angio-Seal.  This  resulted in good hemostasis and an intact distal pulse.   Angiography as mentioned.  The lesion treated was in the mid-anterior  descending artery, and the proximal portion of a large diagonal.  The  anterior descending artery lesion was diffuse and 90% stenotic, at its  tightest portion.  Following balloon dilatation and stent implantation,  there was no residual stenosis.  In the  diagonal branch, there was a  focal 95% stenosis.  Following balloon dilatation and stent  implantation, there was a 10% residual stenosis.   FINAL IMPRESSION:  1. Atherosclerotic coronary vascular disease, two-vessel.  2. S/P successful PCI/DE stent implantation, mid-anterior descending      artery and proximal first diagonal branch.  3. Typical angina was reproduced with device insertion and balloon      inflation      Francisca December, M.D.  Electronically Signed     JHE/MEDQ  D:  11/12/2006  T:  11/12/2006  Job:  [id number]

## 2010-10-30 NOTE — Cardiovascular Report (Signed)
NAME:  [name], [name]             ACCOUNT NO.:  [id number]   MEDICAL RECORD NO.:  [id number]          PATIENT TYPE:  INP   LOCATION:  6527                         FACILITY:  [facility]   PHYSICIAN:  Francisca December, M.D.  DATE OF BIRTH:  May 02, 1941   DATE OF PROCEDURE:  DATE OF DISCHARGE:                            CARDIAC CATHETERIZATION   PROCEDURES PERFORMED:  PCI/drug-eluting stent implantation, proximal to  mid-RCA.   INDICATIONS:  [name] is a 70 year old woman with known ASCVD, a  7 to 67-month status PPCI/DE stent in the mid and mid-LAD and proximal  first diagonal.  She has developed recurrent atypical angina pectoris  with left arm/elbow pain.  A Myocardial perfusion study was significant  for ST-segment depression, produced with angina.  However, there were no  perfusion defects.  Dr. French Ana Turner's completed coronary angiography  revealing widely patent LAD and diagonal stents.  However, there is a  new 95% stenosis in the proximal-to-mid right coronary.  This is a  moderate length lesion, which is most severe in it's distal segment.  She is to undergo catheter-based revascularization at this time.   PROCEDURE NOTE:  Via the previously placed 6-French catheter sheath, a 6-  [place] LIMA catheter was advanced to the ascending aorta where the right  coronary os was engaged.  The patient received 0.75 mg/kg bolus of  bivalirudin, followed by a 1.25 mg/kg per hour constant infusion.  The  resultant ACT was 240 seconds.  A 0.014-inches Luge intracoronary  guidewire was passed across the lesion, without difficulty.  Initial  balloon dilatation was performed with 3.0/15-mm Scimed Maverick  intracoronary balloon.  This was inflated to 6 to 8 atmospheres for not  greater than 1 minute and successfully fully dilated the lesion.  This  balloon was deflated and removed, and a 3.0/23-mm Promus intracoronary  drug-eluting stent was then advanced into place, carefully positioned  and  deployed a peak pressure of 14 atmospheres for 25 seconds.  The  stent balloon was deflated and removed, and a 3.25/15-mm Quantum  Maverick intracoronary balloon dilatation catheter was advanced into  place, carefully positioned to the more distal segment of the stent and  inflated to 16 atmospheres for 20 seconds.  It was deflated and moved  more proximally, and re-inflated to 16 atmospheres again for 20 seconds.  Following confirmation of adequate patency in orthogonal views, both  with and without the guidewire in place, the guiding catheter was  removed.  A right femoral arteriogram in the 45-degree RAO angulation  via the catheter sheath by hand injection documented adequate anatomy  for placement of the percutaneous closure device Angio-Seal.  This was  subsequent deployed with good hemostasis and an intact distal pulse.  The patient is transported to the recovery area in stable condition.   ANGIOGRAPHY:  As mentioned, the lesion treated was in the proximal and  mid-portion of the right coronary.  It extended over approximately 18 to  20 mm but was most severely stenotic in the distal segment.  There was a  mild Shepherd's [place] in the proximal right coronary.  Following  balloon dilatation  and stent implantation, there was no residual  stenosis seen and good distal flow throughout.   FINAL IMPRESSION:  1. Atherosclerotic coronary disease, two vessel.  2. S/P successful percutaneous coronary intervention/drug-eluting      stent implantation proximal and mid-right      coronary artery.  3. Typical angina with both left arm and chest discomfort was      reproduced with device insertion and balloon inflation, along with      clear ST-segment elevation.  This resolved at the completion of      procedure.      Francisca December, M.D.  Electronically Signed     JHE/MEDQ  D:  05/27/2007  T:  05/27/2007  Job:  [id number]

## 2010-11-01 NOTE — Telephone Encounter (Signed)
Records received from [facility] gave to [facility] 11/01/10/km

## 2010-11-02 NOTE — Op Note (Signed)
NAME:  [name], [name]                       ACCOUNT NO.:  [id number]   MEDICAL RECORD NO.:  [id number]                   PATIENT TYPE:  AMB   LOCATION:  ENDO                                 FACILITY:  [facility]   PHYSICIAN:  Petra Kuba, M.D.                 DATE OF BIRTH:  05/16/1941   DATE OF PROCEDURE:  11/30/2003  DATE OF DISCHARGE:                                 OPERATIVE REPORT   PROCEDURE:  Colonoscopy.   ENDOSCOPIST:  Petra Kuba, M.D.   INDICATIONS:  Family history for colon polyps.  Personal history of colon  polyps, due for colonic screening.  Consent was signed after risks,  benefits, methods and options thoroughly discussed multiple times in the  past.   MEDICATIONS USED:  Demerol 70 mcg, Versed 6 mg.   DESCRIPTION OF PROCEDURE:  The rectal inspection was pertinent for external  hemorrhoids.  Digital exam was negative.  A video pediatric adjustable  colonoscope was inserted; easily advanced to the approximate level of the  splenic flexure.  Left-sided diverticula were seen.  To advance into the  cecum requiring some abdominal pressure, but no position changes.  No other  abnormality was seen on insertion.   The cecum was identified by the appendiceal orifice and ileocecal valve.  In  fact, the scope was inserted a short ways into the terminal ileum which was  normal further documentation was obtained.  The scope was slowly withdrawn.  The prep was adequate. There was minimal liquid stool that required washing  and suctioning.  On slow withdrawal through the colon the cecum and  ascending were normal. At approximately the level of the hepatic flexure a  small polyp behind a fold was seen and snared; electrocautery applied.  The  polyp was suctioned through the scope and collected in the trap.   Another mid-transverse small polyp was seen.  Snare electrocautery applied  and this polyp was suctioned through the scope and collected in the trap;  submitted in the  same container.  The scope was further withdrawn.  There  was a tiny descending polyp and a thin distal sigmoid polyp which were hot  biopsied, put in a second container and other than the left-sided  diverticula and some tortuosity no other abnormalities were seen.  The  anorectal posterior retroflexion did confirm some hemorrhoids.  The scope  was inserted up the left side of the colon, the air was suctioned and the  scope removed.  The patient tolerated the procedure well.  There were no  obvious complication.   ENDOSCOPIC DIAGNOSES:  1. Internal and external hemorrhoids.  2. Left-sided diverticula and tortuosity.  3. Two tiny sigmoid and descending polyp hot biopsied.  4. Two tiny to small transverse hepatic flexure polyps snared.  5. Otherwise normal limits to the terminal ileum.   PLAN:  1. Await pathology to determine future colonic screening, probably 3-5  years.  2. Consider ultrasound or CT scan one time if her pain continues.  3. May give her an antispasmodic like Levsin, Levbid, Bentyl, etc., to try     for her pain and have her seen back p.r.n.; otherwise we will leave     further workup and plans Dr. Purnell Shoemaker and Asencion Islam.                                               Petra Kuba, M.D.    MEM/MEDQ  D:  11/30/2003  T:  11/30/2003  Job:  [id number]   cc:   Petra Kuba, M.D.  [address]  [place]  [place] [address]  Fax: [phone]   Lianne Bushy, M.D.  [address]  [place]  [place] [address]  Fax: [phone]   Lanora Manis A. Asencion Islam, M.D.  [address]  [place]  [place] [address]  Fax: [phone]

## 2010-11-02 NOTE — Procedures (Signed)
[place]  Patient:    [name], [name]                    MRN: [id number] Proc. Date: 10/04/99 Adm. Date:  [id number] Attending:  Nelda Marseille CC:         Sheppard Plumber. Earlene Plater, M.D.             Malachi Pro. Ambrose Mantle, M.D.             Maricela Bo, M.D.                           Procedure Report  PROCEDURE:  Colonoscopy with biopsy.  ENDOSCOPIST:  Petra Kuba, M.D.  INDICATIONS:  Patient with bright red blood per rectum, due for colonic screening. Family history of colon polyps.  Consent was signed after risks, benefits, methods, and options were thoroughly discussed in the office.  MEDICINES USED:  Demerol 60 and Versed 6.  DESCRIPTION OF PROCEDURE:  Rectal inspection is pertinent for small external hemorrhoids.  Digital exam was negative.  The pediatric video colonoscope was inserted, and fairly easily advanced around the colon to the cecum.  No abnormalities were seen on insertion except for some left-sided diverticuli. To advance to the cecum did require some left lower quadrant pressure, but no position changes.  The cecum was identified by the appendiceal orifice and the ileocecal  valve.  In fact, the scope was inserted a short ways into the terminal ileum which was normal.  Photo documentation was obtained and the scope was slowly withdrawn. ____ and cecum were normal.  In the mid ascending, a tiny 1-2 mm polyp was seen  and was cold biopsied x 1.  The scope was further withdrawn.  Transverse was normal.  In the mid descending, another tiny polyp was seen and was cold biopsied x 2.  There was some rare left-sided diverticuli.  The prep was adequate.  There as minimal liquid stool that required washing and suctioning.  In the sigmoid, a 2 mm polyp was seen and was cold biopsied x 3.  The scope was further withdrawn back to the rectum.  No other abnormalities were seen.  Once back in the rectum, the scope was retroflexed  and pertinent for some internal hemorrhoids.  The scope was straightened and readvanced a short ways up the sigmoid.  Air was suctioned and the scope was slowly withdrawn.  The patient tolerated the procedure well.  There was no obvious or immediate complication.  ENDOSCOPIC DIAGNOSES: 1. Internal and external hemorrhoids. 2. Left occasional diverticuli. 3. Three tiny to small polyps in the sigmoid distally, mid descending, and mid    ascending, status post cold biopsy. 4. Otherwise within normal limits to the terminal ileum, await pathology, but    probably recheck colonic screening in five years.  Otherwise, gastrointestinal    follow up p.r.n., and return care to [facility]. Ambrose Mantle, M.D. and Maricela Bo,    M.D. for the customary rectals and guaiacs, and other health care maintenance. DD:  10/04/99 TD:  10/05/99 Job: [id number]

## 2010-11-08 ENCOUNTER — Encounter: Payer: Self-pay | Admitting: *Deleted

## 2010-11-14 ENCOUNTER — Encounter: Payer: Self-pay | Admitting: Cardiovascular Disease

## 2010-11-14 ENCOUNTER — Ambulatory Visit (INDEPENDENT_AMBULATORY_CARE_PROVIDER_SITE_OTHER): Payer: Medicare Other | Admitting: Cardiovascular Disease

## 2010-11-14 ENCOUNTER — Other Ambulatory Visit: Payer: Medicare Other | Admitting: *Deleted

## 2010-11-14 VITALS — BP 130/86 | HR 69 | Resp 16 | Ht 62.0 in | Wt 170.8 lb

## 2010-11-14 DIAGNOSIS — E785 Hyperlipidemia, unspecified: Secondary | ICD-10-CM

## 2010-11-14 DIAGNOSIS — I2510 Atherosclerotic heart disease of native coronary artery without angina pectoris: Secondary | ICD-10-CM

## 2010-11-14 MED ORDER — ASPIRIN 81 MG PO TABS: 81.0000 mg | ORAL_TABLET | Freq: Every day | ORAL | Status: DC

## 2010-11-14 NOTE — Patient Instructions (Signed)
Your physician recommends that you schedule a follow-up appointment in: 6 months with Dr. Clifton James  Fasting blood work Friday anytime after 8:30 am\  Your physician has recommended you make the following change in your medication: DECREASE ASPIRIN to 81 mg daily.

## 2010-11-14 NOTE — Progress Notes (Signed)
History of Present Illness: 70 yo WF with h/o CAD with previous drug eluting stents mid LAD, diagonal and proximal/mid RCA as well as HTN, hyperlipidemia, obstructive sleep apnea here today to establish cardiac care. She has been followed in the past by Dr. Cherlyn Labella with [facility]. Drug eluting stents LAD and diagonal May 2008, followed by DES RCA in December 2008. Stress test February 2011 in [facility] with no ischemia, normal LVEF at 88%.   She tells me that she has been doing well. Energy level is down a little. No chest pain or SOB. No palpitations, near syncope or syncope.   Past Medical History  Diagnosis Date  . History of recurrent UTIs   . Hyperlipidemia   . Diverticulosis of colon   . CAD (coronary artery disease)   . Hx of adenomatous colonic polyps   . Sleep apnea     Past Surgical History  Procedure Date  . Angioplasty 5/08 12/08     stent     Current Outpatient Prescriptions  Medication Sig Dispense Refill  . amLODipine (NORVASC) 2.5 MG tablet Take 2.5 mg by mouth daily.        Marland Kitchen aspirin 325 MG tablet Take 325 mg by mouth daily.        . clopidogrel (PLAVIX) 75 MG tablet Take 75 mg by mouth daily.        . cycloSPORINE (RESTASIS) 0.05 % ophthalmic emulsion Place 1 drop into both eyes every 12 (twelve) hours.       . fish oil-omega-3 fatty acids 1000 MG capsule Take 2 capsules by mouth 2 (two) times daily.       . hydrocortisone-pramoxine (ANALPRAM-HC) 2.5-1 % rectal cream Place rectally 2 (two) times daily as needed.       . Lactobacillus CAPS Take by mouth daily.        . multivitamin (THERAGRAN) per tablet Take 1 tablet by mouth daily.        . nitroGLYCERIN (NITROLINGUAL DUO PACK) 0.4 MG/SPRAY spray Place 1 spray under the tongue every 5 (five) minutes as needed.        . Psyllium (METAMUCIL) 30.9 % POWD Take by mouth 1 dose over 24 hours.        . rosuvastatin (CRESTOR) 40 MG tablet Take 20 mg by mouth daily.       Marland Kitchen DISCONTD: docusate sodium  (COLACE) 100 MG capsule Take 100 mg by mouth daily.          No Known Allergies  History   Social History  . Marital Status: Married    Spouse Name: N/A    Number of Children: N/A  . Years of Education: N/A   Occupational History  . retired     Social History Main Topics  . Smoking status: Never Smoker   . Smokeless tobacco: Not on file  . Alcohol Use: 1.0 - 1.5 oz/week    2-3 drink(s) per week  . Drug Use: No  . Sexually Active: Not on file   Other Topics Concern  . Not on file   Social History Narrative  . No narrative on file    Family History  Problem Relation Age of Onset  . Colon polyps Brother   . Colon polyps Paternal Grandfather   . Heart disease Father   . Heart disease Brother   . Heart disease Mother   . Heart disease Other     Grandmother   . Cancer Neg Hx  colon    Review of Systems:  As stated in the HPI and otherwise negative.   BP 130/86  Pulse 69  Resp 16  Ht 5\' 2"  (1.575 m)  Wt 170 lb 12.8 oz (77.474 kg)  BMI 31.24 kg/m2  Physical Examination: General: Well developed, well nourished, NAD HEENT: OP clear, mucus membranes moist SKIN: warm, dry. No rashes. Neuro: No focal deficits Musculoskeletal: Muscle strength 5/5 all ext Psychiatric: Mood and affect normal Neck: No JVD, no carotid bruits, no thyromegaly, no lymphadenopathy. Lungs:Clear bilaterally, no wheezes, rhonci, crackles Cardiovascular: Regular rate and rhythm. No murmurs, gallops or rubs. Abdomen:Soft. Bowel sounds present. Non-tender.  Extremities: No lower extremity edema. Pulses are 2 + in the bilateral DP/PT.  EKG:NSR, rate 69 bpm. Normal EKG

## 2010-11-14 NOTE — Assessment & Plan Note (Signed)
Stable. Continue ASA, Plavix and statin. Will decrease ASA to 81 mg po Qdaily.

## 2010-11-14 NOTE — Assessment & Plan Note (Signed)
Continue statin. Check fasting lipids and LFTs this week.

## 2010-11-16 ENCOUNTER — Other Ambulatory Visit (INDEPENDENT_AMBULATORY_CARE_PROVIDER_SITE_OTHER): Payer: Medicare Other | Admitting: *Deleted

## 2010-11-16 DIAGNOSIS — E785 Hyperlipidemia, unspecified: Secondary | ICD-10-CM

## 2010-11-16 DIAGNOSIS — I2510 Atherosclerotic heart disease of native coronary artery without angina pectoris: Secondary | ICD-10-CM

## 2010-11-16 LAB — CBC WITH DIFFERENTIAL/PLATELET
Basophils Absolute: 0.1 10*3/uL (ref 0.0–0.1)
Basophils Relative: 0.6 % (ref 0.0–3.0)
Eosinophils Absolute: 0.2 10*3/uL (ref 0.0–0.7)
Eosinophils Relative: 2.9 % (ref 0.0–5.0)
HCT: 39.2 % (ref 36.0–46.0)
Hemoglobin: 13.3 g/dL (ref 12.0–15.0)
Lymphocytes Relative: 47.4 % — ABNORMAL HIGH (ref 12.0–46.0)
Lymphs Abs: 4 10*3/uL (ref 0.7–4.0)
MCHC: 33.9 g/dL (ref 30.0–36.0)
MCV: 92.2 fl (ref 78.0–100.0)
Monocytes Absolute: 0.6 10*3/uL (ref 0.1–1.0)
Monocytes Relative: 6.6 % (ref 3.0–12.0)
Neutro Abs: 3.6 10*3/uL (ref 1.4–7.7)
Neutrophils Relative %: 42.5 % — ABNORMAL LOW (ref 43.0–77.0)
Platelets: 302 10*3/uL (ref 150.0–400.0)
RBC: 4.25 Mil/uL (ref 3.87–5.11)
RDW: 13.7 % (ref 11.5–14.6)
WBC: 8.4 10*3/uL (ref 4.5–10.5)

## 2010-11-16 LAB — BASIC METABOLIC PANEL
BUN: 17 mg/dL (ref 6–23)
CO2: 24 mEq/L (ref 19–32)
Calcium: 9.2 mg/dL (ref 8.4–10.5)
Chloride: 108 mEq/L (ref 96–112)
Creatinine, Ser: 0.9 mg/dL (ref 0.4–1.2)
GFR: 69.17 mL/min (ref >60.00)
Glucose, Bld: 87 mg/dL (ref 70–99)
Potassium: 4.5 mEq/L (ref 3.5–5.1)
Sodium: 139 mEq/L (ref 135–145)

## 2010-11-16 LAB — LIPID PANEL
Cholesterol: 121 mg/dL (ref 0–200)
HDL: 51.3 mg/dL (ref >39.00)
LDL Cholesterol: 50 mg/dL (ref 0–99)
Total CHOL/HDL Ratio: 2
Triglycerides: 97 mg/dL (ref 0.0–149.0)
VLDL: 19.4 mg/dL (ref 0.0–40.0)

## 2010-11-16 LAB — HEPATIC FUNCTION PANEL
ALT: 24 U/L (ref 0–35)
AST: 27 U/L (ref 0–37)
Albumin: 4 g/dL (ref 3.5–5.2)
Alkaline Phosphatase: 55 U/L (ref 39–117)
Bilirubin, Direct: 0.1 mg/dL (ref 0.0–0.3)
Total Bilirubin: 0.8 mg/dL (ref 0.3–1.2)
Total Protein: 6.5 g/dL (ref 6.0–8.3)

## 2010-11-20 ENCOUNTER — Telehealth: Payer: Self-pay | Admitting: Cardiovascular Disease

## 2010-11-20 NOTE — Telephone Encounter (Signed)
Patient is aware of test/lab results.  

## 2010-11-20 NOTE — Telephone Encounter (Signed)
Returning your call from this afternoon.

## 2010-12-10 ENCOUNTER — Ambulatory Visit
Admission: RE | Admit: 2010-12-10 | Discharge: 2010-12-10 | Disposition: A | Discharge status: Home / Self Care | Payer: Medicare Other | Source: Ambulatory Visit | Attending: Obstetrics and Gynecology | Admitting: Obstetrics and Gynecology

## 2010-12-10 DIAGNOSIS — Z1231 Encounter for screening mammogram for malignant neoplasm of breast: Secondary | ICD-10-CM

## 2011-01-01 DIAGNOSIS — M25571 Pain in right ankle and joints of right foot: Secondary | ICD-10-CM | POA: Insufficient documentation

## 2011-01-09 ENCOUNTER — Other Ambulatory Visit: Payer: Self-pay | Admitting: Cardiology

## 2011-01-09 ENCOUNTER — Telehealth: Payer: Self-pay | Admitting: Cardiovascular Disease

## 2011-01-09 DIAGNOSIS — I2510 Atherosclerotic heart disease of native coronary artery without angina pectoris: Secondary | ICD-10-CM

## 2011-01-09 MED ORDER — AMLODIPINE BESYLATE 2.5 MG PO TABS: 2.5000 mg | ORAL_TABLET | Freq: Every day | ORAL | Status: DC

## 2011-01-09 MED ORDER — CLOPIDOGREL BISULFATE 75 MG PO TABS: 75.0000 mg | ORAL_TABLET | Freq: Every day | ORAL | Status: DC

## 2011-01-09 NOTE — Telephone Encounter (Signed)
Refills resent.

## 2011-01-09 NOTE — Telephone Encounter (Signed)
Refills sent to pharmacy. 

## 2011-01-09 NOTE — Telephone Encounter (Signed)
Pt has transferred to dr Clifton James and needs refill of meds her last cardiologist precribed, plavix 75 mg, generic and norvasc  2.5 mg at [facility]

## 2011-03-25 LAB — BASIC METABOLIC PANEL
BUN: 12
CO2: 26
Calcium: 8.7
Chloride: 112
Creatinine, Ser: 0.91
GFR calc Af Amer: >60
GFR calc non Af Amer: >60
Glucose, Bld: 102 — ABNORMAL HIGH
Potassium: 3.7
Sodium: 142

## 2011-03-25 LAB — CBC
HCT: 39.1
Hemoglobin: 13.2
MCHC: 33.7
MCV: 91.4
Platelets: 282
RBC: 4.28
RDW: 13.9
WBC: 9.6

## 2011-04-19 DIAGNOSIS — H3530 Unspecified macular degeneration: Secondary | ICD-10-CM | POA: Insufficient documentation

## 2011-04-19 DIAGNOSIS — E669 Obesity, unspecified: Secondary | ICD-10-CM | POA: Insufficient documentation

## 2011-04-19 DIAGNOSIS — M1990 Unspecified osteoarthritis, unspecified site: Secondary | ICD-10-CM | POA: Insufficient documentation

## 2011-04-19 DIAGNOSIS — R32 Unspecified urinary incontinence: Secondary | ICD-10-CM | POA: Insufficient documentation

## 2011-04-19 DIAGNOSIS — E663 Overweight: Secondary | ICD-10-CM | POA: Insufficient documentation

## 2011-04-22 ENCOUNTER — Ambulatory Visit (INDEPENDENT_AMBULATORY_CARE_PROVIDER_SITE_OTHER): Payer: Medicare Other | Admitting: Cardiovascular Disease

## 2011-04-22 ENCOUNTER — Encounter: Payer: Self-pay | Admitting: Cardiovascular Disease

## 2011-04-22 VITALS — BP 139/90 | HR 67 | Ht 62.0 in | Wt 170.0 lb

## 2011-04-22 DIAGNOSIS — E785 Hyperlipidemia, unspecified: Secondary | ICD-10-CM

## 2011-04-22 DIAGNOSIS — I2510 Atherosclerotic heart disease of native coronary artery without angina pectoris: Secondary | ICD-10-CM

## 2011-04-22 MED ORDER — ROSUVASTATIN CALCIUM 40 MG PO TABS: 40.0000 mg | ORAL_TABLET | Freq: Every day | ORAL | Status: DC

## 2011-04-22 NOTE — Assessment & Plan Note (Signed)
Recent arm pain c/w her angina. Will arrange treadmill stress test.

## 2011-04-22 NOTE — Assessment & Plan Note (Signed)
Her LDL in June 2012 was 50 on Crestor 40 mg po Qdaily. Her Crestor was decreased to 20 mg per day and now her LDL April 17, 2011 in primary care was 84, HDL was 50. Will increase Crestor 40 mg po once daily.

## 2011-04-22 NOTE — Progress Notes (Signed)
History of Present Illness:70 yo WF with h/o CAD with previous drug eluting stents mid LAD, diagonal and proximal/mid RCA as well as HTN, hyperlipidemia, obstructive sleep apnea here today to establish cardiac care. She has been followed in the past by Dr. Cherlyn Labella with [facility]. Drug eluting stents LAD and diagonal May 2008, followed by DES RCA in December 2008. Stress test February 2011 in [facility] with no ischemia, normal LVEF at 88%.   She is here for cardiac follow up. She describes having some left arm pain with walking. This does not happen every time she walks. Her prior angina was left arm pain. No SOB. No palpitations, near syncope or syncope.   Her primary care is Dr. Timothy Lasso. She is on CPAP.    Past Medical History  Diagnosis Date  . History of recurrent UTIs   . Hyperlipidemia   . Diverticulosis of colon   . CAD (coronary artery disease)   . Hx of adenomatous colonic polyps   . Sleep apnea     Past Surgical History  Procedure Date  . Angioplasty 5/08 12/08     stent   . Right shoulder surgery   . Cataract left   . Vitrectomy left eye     Current Outpatient Prescriptions  Medication Sig Dispense Refill  . amLODipine (NORVASC) 2.5 MG tablet Take 1 tablet (2.5 mg total) by mouth daily.  30 tablet  8  . aspirin 81 MG tablet Take 1 tablet (81 mg total) by mouth daily.  30 tablet  0  . clopidogrel (PLAVIX) 75 MG tablet Take 1 tablet (75 mg total) by mouth daily.  30 tablet  8  . cycloSPORINE (RESTASIS) 0.05 % ophthalmic emulsion Place 1 drop into both eyes every 12 (twelve) hours.       . fish oil-omega-3 fatty acids 1000 MG capsule Take 2 capsules by mouth 2 (two) times daily.       . hydrocortisone-pramoxine (ANALPRAM-HC) 2.5-1 % rectal cream Place rectally 2 (two) times daily as needed.       . Lactobacillus CAPS Take by mouth daily.        . multivitamin (THERAGRAN) per tablet Take 1 tablet by mouth daily.        . nitroGLYCERIN (NITROLINGUAL DUO  PACK) 0.4 MG/SPRAY spray Place 1 spray under the tongue every 5 (five) minutes as needed.        . Psyllium (METAMUCIL) 30.9 % POWD Take by mouth 1 dose over 24 hours.        . rosuvastatin (CRESTOR) 20 MG tablet Take 20 mg by mouth daily.          No Known Allergies  History   Social History  . Marital Status: Married    Spouse Name: N/A    Number of Children: 0  . Years of Education: N/A   Occupational History  . retired     Social History Main Topics  . Smoking status: Never Smoker   . Smokeless tobacco: Not on file  . Alcohol Use: 1.0 - 1.5 oz/week    2-3 drink(s) per week  . Drug Use: No  . Sexually Active: Not on file   Other Topics Concern  . Not on file   Social History Narrative  . No narrative on file    Family History  Problem Relation Age of Onset  . Colon polyps Brother   . Heart disease Brother   . Colon polyps Paternal Grandfather   . Heart disease  Father   . Heart disease Brother   . Heart disease Mother   . Heart disease Other     Grandmother   . Cancer Neg Hx     colon    Review of Systems:  As stated in the HPI and otherwise negative.   BP 139/90  Pulse 67  Ht 5\' 2"  (1.575 m)  Wt 170 lb (77.111 kg)  BMI 31.09 kg/m2  Physical Examination: General: Well developed, well nourished, NAD HEENT: OP clear, mucus membranes moist SKIN: warm, dry. No rashes. Neuro: No focal deficits Musculoskeletal: Muscle strength 5/5 all ext Psychiatric: Mood and affect normal Neck: No JVD, no carotid bruits, no thyromegaly, no lymphadenopathy. Lungs:Clear bilaterally, no wheezes, rhonci, crackles Cardiovascular: Regular rate and rhythm. No murmurs, gallops or rubs. Abdomen:Soft. Bowel sounds present. Non-tender.  Extremities: No lower extremity edema. Pulses are 2 + in the bilateral DP/PT.

## 2011-04-22 NOTE — Patient Instructions (Signed)
Your physician wants you to follow-up in:12 months.  You will receive a reminder letter in the mail two months in advance. If you don't receive a letter, please call our office to schedule the follow-up appointment.  Your physician has requested that you have an exercise tolerance test. For further information please visit [email or web address] Please also follow instruction sheet, as given. To be done with Dr. Clifton James    Your physician has recommended you make the following change in your medication: Increase Crestor to 40 mg by mouth daily.  Your physician recommends that you return for fasting lab work in: 3 months--Lipid and Liver profile--272.0

## 2011-04-24 ENCOUNTER — Ambulatory Visit (INDEPENDENT_AMBULATORY_CARE_PROVIDER_SITE_OTHER): Payer: Medicare Other | Admitting: Cardiovascular Disease

## 2011-04-24 DIAGNOSIS — I2510 Atherosclerotic heart disease of native coronary artery without angina pectoris: Secondary | ICD-10-CM

## 2011-04-24 NOTE — Progress Notes (Signed)
Exercise Treadmill Test  Pre-Exercise Testing Evaluation Rhythm: normal sinus  Rate: 79   PR:  17 QRS:  .07  QT:  39 QTc: 44     Test  Exercise Tolerance Test Ordering MD: Melene Muller, MD  Interpreting MD:  Melene Muller, MD  Unique Test No: 1  Treadmill:  1  Indication for ETT: known ASHD  Contraindication to ETT: No   Stress Modality: exercise - treadmill  Cardiac Imaging Performed: non   Protocol: standard Bruce - maximal  Max BP:  181/71  Max MPHR (bpm):  149 85% MPR (bpm):  126  MPHR obtained (bpm): 139 % MPHR obtained:  95  Reached 85% MPHR (min:sec):  4:23 Total Exercise Time (min-sec):  6:34  Workload in METS:  7.8 Borg Scale: 15  Reason ETT Terminated:  dyspnea    ST Segment Analysis At Rest: normal ST segments - no evidence of significant ST depression With Exercise: no evidence of significant ST depression  Other Information Arrhythmia:  No Angina during ETT:  absent (0) Quality of ETT:  non-diagnostic  ETT Interpretation:  normal - no evidence of ischemia by ST analysis  Comments: Pt exercised for 6:34 min. NO chest pain. Tingling in left arm. No ischemic EKG changes.   Recommendations: I will see her back in 3 weeks. If any changes, will repeat cath.

## 2011-05-23 ENCOUNTER — Encounter: Payer: Self-pay | Admitting: Cardiovascular Disease

## 2011-05-23 ENCOUNTER — Ambulatory Visit (INDEPENDENT_AMBULATORY_CARE_PROVIDER_SITE_OTHER): Payer: Medicare Other | Admitting: Cardiovascular Disease

## 2011-05-23 VITALS — BP 128/78 | HR 82 | Ht 62.0 in | Wt 171.8 lb

## 2011-05-23 DIAGNOSIS — I2510 Atherosclerotic heart disease of native coronary artery without angina pectoris: Secondary | ICD-10-CM

## 2011-05-23 DIAGNOSIS — E785 Hyperlipidemia, unspecified: Secondary | ICD-10-CM

## 2011-05-23 NOTE — Progress Notes (Signed)
History of Present Illness: 70 yo WF with h/o CAD with previous drug eluting stents mid LAD, diagonal and proximal/mid RCA as well as HTN, hyperlipidemia, obstructive sleep apnea here today for cardiac follow up. She has been followed in the past by Dr. Cherlyn Labella with [facility]. I saw her for the first time on 04/22/11. Her cardiac history includes drug eluting stents LAD and Diagonal May 2008, followed by DES RCA in December 2008. Stress test February 2011 in [facility] with no ischemia, normal LVEF at 88%. At her initial visit in our office on 04/22/11, she had c/o left arm pain with walking. This does not happen every time she walks. Her prior anginal equivalent was left arm pain. No associated SOB, palpitations, near syncope or syncope. I arranged a treadmill stress test on 04/24/11. She exercised for 6 minutes and 34 seconds. There were no ischemic EKG changes. She did have tingling in her arm with exercise but no chest pain.   She returns today for follow up. She tells me that she had an injection for an eye procedure and had pain and pressure in her arm. She also had palpitations that night. Otherwise she has been feeling well. No chest pain or SOB. She has minimal arm discomfort only rarely. At her last visit, Crestor was increased to 40 mg po QHS because lipids were not at goal.   Her primary care is Dr. Timothy Lasso. She is on CPAP.   Past Medical History  Diagnosis Date  . History of recurrent UTIs   . Hyperlipidemia   . Diverticulosis of colon   . CAD (coronary artery disease)   . Hx of adenomatous colonic polyps   . Sleep apnea     Past Surgical History  Procedure Date  . Angioplasty 5/08 12/08     stent   . Right shoulder surgery   . Cataract left   . Vitrectomy left eye     Current Outpatient Prescriptions  Medication Sig Dispense Refill  . amLODipine (NORVASC) 2.5 MG tablet Take 1 tablet (2.5 mg total) by mouth daily.  30 tablet  8  . aspirin 81 MG tablet  Take 1 tablet (81 mg total) by mouth daily.  30 tablet  0  . clopidogrel (PLAVIX) 75 MG tablet Take 1 tablet (75 mg total) by mouth daily.  30 tablet  8  . cycloSPORINE (RESTASIS) 0.05 % ophthalmic emulsion Place 1 drop into both eyes every 12 (twelve) hours.       . fish oil-omega-3 fatty acids 1000 MG capsule Take 2 capsules by mouth 2 (two) times daily.       . Lactobacillus CAPS Take by mouth daily.        . multivitamin (THERAGRAN) per tablet Take 1 tablet by mouth daily.        . nitroGLYCERIN (NITROLINGUAL DUO PACK) 0.4 MG/SPRAY spray Place 1 spray under the tongue every 5 (five) minutes as needed.        . Psyllium (METAMUCIL) 30.9 % POWD Take by mouth 1 dose over 24 hours.        . rosuvastatin (CRESTOR) 40 MG tablet Take 1 tablet (40 mg total) by mouth daily.  30 tablet  11    No Known Allergies  History   Social History  . Marital Status: Married    Spouse Name: N/A    Number of Children: 0  . Years of Education: N/A   Occupational History  . retired  Social History Main Topics  . Smoking status: Never Smoker   . Smokeless tobacco: Not on file  . Alcohol Use: 1.0 - 1.5 oz/week    2-3 drink(s) per week  . Drug Use: No  . Sexually Active: Not on file   Other Topics Concern  . Not on file   Social History Narrative  . No narrative on file    Family History  Problem Relation Age of Onset  . Colon polyps Brother   . Heart disease Brother   . Colon polyps Paternal Grandfather   . Heart disease Father   . Heart disease Brother   . Heart disease Mother   . Heart disease Other     Grandmother   . Cancer Neg Hx     colon    Review of Systems:  As stated in the HPI and otherwise negative.   BP 128/78  Pulse 82  Ht 5\' 2"  (1.575 m)  Wt 171 lb 12.8 oz (77.928 kg)  BMI 31.42 kg/m2  Physical Examination: General: Well developed, well nourished, NAD HEENT: OP clear, mucus membranes moist SKIN: warm, dry. No rashes. Neuro: No focal  deficits Musculoskeletal: Muscle strength 5/5 all ext Psychiatric: Mood and affect normal Neck: No JVD, no carotid bruits, no thyromegaly, no lymphadenopathy. Lungs:Clear bilaterally, no wheezes, rhonci, crackles Cardiovascular: Regular rate and rhythm. No murmurs, gallops or rubs. Abdomen:Soft. Bowel sounds present. Non-tender.  Extremities: No lower extremity edema. Pulses are 2 + in the bilateral DP/PT.  Exercise Stress Test 04/24/11:   ST Segment Analysis  At Rest: normal ST segments - no evidence of significant ST depression  With Exercise: no evidence of significant ST depression  Other Information  Arrhythmia: No Angina during ETT: absent (0)  Quality of ETT: non-diagnostic  ETT Interpretation: normal - no evidence of ischemia by ST analysis  Comments: Pt exercised for 6:34 min. NO chest pain. Tingling in left arm. No ischemic EKG changes.

## 2011-05-23 NOTE — Assessment & Plan Note (Signed)
Stable. No changes at this time.

## 2011-05-23 NOTE — Assessment & Plan Note (Signed)
Her Crestor was increased in November to 40 mg po QHS. She will need repeat liver and lipids in 8 weeks.

## 2011-05-23 NOTE — Patient Instructions (Signed)
Your physician wants you to follow-up in: 6 months. You will receive a reminder letter in the mail two months in advance. If you don't receive a letter, please call our office to schedule the follow-up appointment.  Your physician recommends that you return for fasting lab work the first week of February 2013

## 2011-07-25 ENCOUNTER — Other Ambulatory Visit (INDEPENDENT_AMBULATORY_CARE_PROVIDER_SITE_OTHER): Payer: Medicare Other | Admitting: *Deleted

## 2011-07-25 DIAGNOSIS — E785 Hyperlipidemia, unspecified: Secondary | ICD-10-CM

## 2011-07-25 LAB — HEPATIC FUNCTION PANEL
ALT: 28 U/L (ref 0–35)
AST: 29 U/L (ref 0–37)
Albumin: 4.3 g/dL (ref 3.5–5.2)
Alkaline Phosphatase: 57 U/L (ref 39–117)
Bilirubin, Direct: 0 mg/dL (ref 0.0–0.3)
Total Bilirubin: 0.7 mg/dL (ref 0.3–1.2)
Total Protein: 7.2 g/dL (ref 6.0–8.3)

## 2011-07-25 LAB — LIPID PANEL
Cholesterol: 134 mg/dL (ref 0–200)
HDL: 59.5 mg/dL (ref >39.00)
LDL Cholesterol: 55 mg/dL (ref 0–99)
Total CHOL/HDL Ratio: 2
Triglycerides: 96 mg/dL (ref 0.0–149.0)
VLDL: 19.2 mg/dL (ref 0.0–40.0)

## 2011-10-07 ENCOUNTER — Other Ambulatory Visit: Payer: Self-pay | Admitting: Cardiovascular Disease

## 2011-10-07 NOTE — Telephone Encounter (Signed)
Refilled plavix and amlodipine

## 2011-11-06 ENCOUNTER — Other Ambulatory Visit: Payer: Self-pay | Admitting: Internal Medicine

## 2011-11-06 DIAGNOSIS — Z1231 Encounter for screening mammogram for malignant neoplasm of breast: Secondary | ICD-10-CM

## 2011-11-25 ENCOUNTER — Encounter: Payer: Self-pay | Admitting: Cardiovascular Disease

## 2011-11-25 ENCOUNTER — Ambulatory Visit (INDEPENDENT_AMBULATORY_CARE_PROVIDER_SITE_OTHER): Payer: Medicare Other | Admitting: Cardiovascular Disease

## 2011-11-25 ENCOUNTER — Encounter: Payer: Self-pay | Admitting: *Deleted

## 2011-11-25 VITALS — BP 149/92 | HR 70 | Ht 62.0 in | Wt 169.0 lb

## 2011-11-25 DIAGNOSIS — I2510 Atherosclerotic heart disease of native coronary artery without angina pectoris: Secondary | ICD-10-CM

## 2011-11-25 DIAGNOSIS — E785 Hyperlipidemia, unspecified: Secondary | ICD-10-CM

## 2011-11-25 DIAGNOSIS — I10 Essential (primary) hypertension: Secondary | ICD-10-CM | POA: Insufficient documentation

## 2011-11-25 LAB — CBC WITH DIFFERENTIAL/PLATELET
Basophils Absolute: 0.1 10*3/uL (ref 0.0–0.1)
Basophils Relative: 0.8 % (ref 0.0–3.0)
Eosinophils Absolute: 0.3 10*3/uL (ref 0.0–0.7)
Eosinophils Relative: 2.8 % (ref 0.0–5.0)
HCT: 41.5 % (ref 36.0–46.0)
Hemoglobin: 13.7 g/dL (ref 12.0–15.0)
Lymphocytes Relative: 48.7 % — ABNORMAL HIGH (ref 12.0–46.0)
Lymphs Abs: 4.5 10*3/uL — ABNORMAL HIGH (ref 0.7–4.0)
MCHC: 33.1 g/dL (ref 30.0–36.0)
MCV: 92.8 fl (ref 78.0–100.0)
Monocytes Absolute: 0.6 10*3/uL (ref 0.1–1.0)
Monocytes Relative: 6.5 % (ref 3.0–12.0)
Neutro Abs: 3.8 10*3/uL (ref 1.4–7.7)
Neutrophils Relative %: 41.2 % — ABNORMAL LOW (ref 43.0–77.0)
Platelets: 291 10*3/uL (ref 150.0–400.0)
RBC: 4.47 Mil/uL (ref 3.87–5.11)
RDW: 13.9 % (ref 11.5–14.6)
WBC: 9.1 10*3/uL (ref 4.5–10.5)

## 2011-11-25 LAB — BASIC METABOLIC PANEL
BUN: 16 mg/dL (ref 6–23)
CO2: 26 mEq/L (ref 19–32)
Calcium: 9.1 mg/dL (ref 8.4–10.5)
Chloride: 107 mEq/L (ref 96–112)
Creatinine, Ser: 0.9 mg/dL (ref 0.4–1.2)
GFR: 64.62 mL/min (ref >60.00)
Glucose, Bld: 92 mg/dL (ref 70–99)
Potassium: 4.1 mEq/L (ref 3.5–5.1)
Sodium: 142 mEq/L (ref 135–145)

## 2011-11-25 LAB — PROTIME-INR
INR: 0.9 ratio (ref 0.8–1.0)
Prothrombin Time: 10.3 s (ref 10.2–12.4)

## 2011-11-25 NOTE — Assessment & Plan Note (Addendum)
She is having arm pain which is her anginal equivalent as well as dyspnea and fatigue. She has multiple stents and normal stress testing 2 years ago with normal treadmill last year. I think she needs further ischemic workup but given her history, I think cardiac cath is indicated. She is in agreement to proceed with this. Will arrange for this week in outpt cath lab. Labs today. Risks and benefits reviewed.

## 2011-11-25 NOTE — Progress Notes (Signed)
History of Present Illness: 71 yo WF with h/o CAD with previous drug eluting stents mid LAD, diagonal and proximal/mid RCA as well as HTN, hyperlipidemia, obstructive sleep apnea here today for cardiac follow up. She has been followed in the past by Dr. Cherlyn Labella with [facility]. I saw her for the first time on 04/22/11. Her cardiac history includes drug eluting stents LAD and Diagonal May 2008, followed by DES RCA in December 2008. Stress test February 2011 in [facility] with no ischemia, normal LVEF at 88%. At her initial visit in our office on 04/22/11, she had c/o left arm pain with walking but not all of the time.  Her prior anginal equivalent was left arm pain. There was no associated SOB, palpitations, near syncope or syncope. I arranged a treadmill stress test on 04/24/11. She exercised for 6 minutes and 34 seconds. There were no ischemic EKG changes. She did have tingling in her arm with exercise but no chest pain.   She returns today for follow up. She has had recurrence of pain in her left arm with exertion.  She does note that her energy level is down over the last few months. She has also had shortness of breath.   Primary Care Physician: Creola Corn  Last Lipid Profile:  Lipid Panel     Component Value Date/Time   CHOL 134 07/25/2011 0841   TRIG 96.0 07/25/2011 0841   HDL 59.50 07/25/2011 0841   CHOLHDL 2 07/25/2011 0841   VLDL 19.2 07/25/2011 0841   LDLCALC 55 07/25/2011 0841     Past Medical History  Diagnosis Date  . History of recurrent UTIs   . Hyperlipidemia   . Diverticulosis of colon   . CAD (coronary artery disease)   . Hx of adenomatous colonic polyps   . Sleep apnea     Past Surgical History  Procedure Date  . Angioplasty 5/08 12/08     stent   . Right shoulder surgery   . Cataract left   . Vitrectomy left eye     Current Outpatient Prescriptions  Medication Sig Dispense Refill  . amLODipine (NORVASC) 2.5 MG tablet TAKE ONE TABLET DAILY  30  tablet  8  . aspirin 81 MG tablet Take 1 tablet (81 mg total) by mouth daily.  30 tablet  0  . clopidogrel (PLAVIX) 75 MG tablet TAKE ONE TABLET DAILY GENERIC FOR PLAVIX  30 tablet  8  . cycloSPORINE (RESTASIS) 0.05 % ophthalmic emulsion Place 1 drop into both eyes every 12 (twelve) hours.       . fish oil-omega-3 fatty acids 1000 MG capsule Take 2 capsules by mouth 2 (two) times daily.       . Lactobacillus CAPS Take by mouth daily.        . multivitamin (THERAGRAN) per tablet Take 1 tablet by mouth daily.        . nitroGLYCERIN (NITROLINGUAL DUO PACK) 0.4 MG/SPRAY spray Place 1 spray under the tongue every 5 (five) minutes as needed.        . Psyllium (METAMUCIL) 30.9 % POWD Take by mouth 1 dose over 24 hours.        . rosuvastatin (CRESTOR) 40 MG tablet Take 1 tablet (40 mg total) by mouth daily.  30 tablet  11    Allergies  Allergen Reactions  . Fluoride Preparations     A test for eye to make pictures, causing pressure    History   Social History  .  Marital Status: Married    Spouse Name: N/A    Number of Children: 0  . Years of Education: N/A   Occupational History  . retired     Social History Main Topics  . Smoking status: Never Smoker   . Smokeless tobacco: Not on file  . Alcohol Use: 1.0 - 1.5 oz/week    2-3 drink(s) per week  . Drug Use: No  . Sexually Active: Not on file   Other Topics Concern  . Not on file   Social History Narrative  . No narrative on file    Family History  Problem Relation Age of Onset  . Colon polyps Brother   . Heart disease Brother   . Colon polyps Paternal Grandfather   . Heart disease Father   . Heart disease Brother   . Heart disease Mother   . Heart disease Other     Grandmother   . Cancer Neg Hx     colon    Review of Systems:  As stated in the HPI and otherwise negative.   BP 149/92  Pulse 70  Ht 5\' 2"  (1.575 m)  Wt 169 lb (76.658 kg)  BMI 30.91 kg/m2  Physical Examination: General: Well developed, well  nourished, NAD HEENT: OP clear, mucus membranes moist SKIN: warm, dry. No rashes. Neuro: No focal deficits Musculoskeletal: Muscle strength 5/5 all ext Psychiatric: Mood and affect normal Neck: No JVD, no carotid bruits, no thyromegaly, no lymphadenopathy. Lungs:Clear bilaterally, no wheezes, rhonci, crackles Cardiovascular: Regular rate and rhythm. No murmurs, gallops or rubs. Abdomen:Soft. Bowel sounds present. Non-tender.  Extremities: No lower extremity edema. Pulses are 2 + in the bilateral DP/PT.  EKG: NSR, rate 70 bpm.

## 2011-11-25 NOTE — Patient Instructions (Signed)
Your physician recommends that you schedule a follow-up appointment in: 3 weeks.   Your physician has requested that you have a cardiac catheterization. Cardiac catheterization is used to diagnose and/or treat various heart conditions. Doctors may recommend this procedure for a number of different reasons. The most common reason is to evaluate chest pain. Chest pain can be a symptom of coronary artery disease (CAD), and cardiac catheterization can show whether plaque is narrowing or blocking your heart's arteries. This procedure is also used to evaluate the valves, as well as measure the blood flow and oxygen levels in different parts of your heart. For further information please visit [email or web address] Please follow instruction sheet, as given. Scheduled for November 27, 2011

## 2011-11-25 NOTE — Assessment & Plan Note (Signed)
BP is well controlled at home. Slightly elevated today. She will follow at home and let us know if elevated.

## 2011-11-25 NOTE — Assessment & Plan Note (Signed)
Lipids well controlled on statin. No changes.

## 2011-11-27 ENCOUNTER — Inpatient Hospital Stay (HOSPITAL_BASED_OUTPATIENT_CLINIC_OR_DEPARTMENT_OTHER)
Admission: RE | Admit: 2011-11-27 | Discharge: 2011-11-27 | Disposition: A | Discharge status: Home / Self Care | Payer: Medicare Other | Source: Ambulatory Visit | Attending: Cardiovascular Disease | Admitting: Cardiovascular Disease

## 2011-11-27 ENCOUNTER — Encounter (HOSPITAL_BASED_OUTPATIENT_CLINIC_OR_DEPARTMENT_OTHER)
Admission: RE | Disposition: A | Discharge status: Home / Self Care | Payer: Self-pay | Source: Ambulatory Visit | Attending: Cardiovascular Disease

## 2011-11-27 DIAGNOSIS — I2510 Atherosclerotic heart disease of native coronary artery without angina pectoris: Secondary | ICD-10-CM

## 2011-11-27 DIAGNOSIS — I200 Unstable angina: Secondary | ICD-10-CM | POA: Insufficient documentation

## 2011-11-27 DIAGNOSIS — Z9861 Coronary angioplasty status: Secondary | ICD-10-CM | POA: Insufficient documentation

## 2011-11-27 SURGERY — JV LEFT HEART CATHETERIZATION WITH CORONARY ANGIOGRAM
Anesthesia: Moderate Sedation

## 2011-11-27 MED ORDER — SODIUM CHLORIDE 0.9 % IV SOLN: 250.0000 mL | INTRAVENOUS | Status: DC | PRN

## 2011-11-27 MED ORDER — DIAZEPAM 5 MG PO TABS
5.0000 mg | ORAL_TABLET | ORAL | Status: AC
  Administered 2011-11-27: 5 mg via ORAL

## 2011-11-27 MED ORDER — SODIUM CHLORIDE 0.9 % IJ SOLN
3.0000 mL | Freq: Two times a day (BID) | INTRAMUSCULAR | Status: DC
Start: 2011-11-27 — End: 2011-11-27

## 2011-11-27 MED ORDER — ACETAMINOPHEN 325 MG PO TABS: 650.0000 mg | ORAL_TABLET | ORAL | Status: DC | PRN

## 2011-11-27 MED ORDER — SODIUM CHLORIDE 0.9 % IJ SOLN: 3.0000 mL | INTRAMUSCULAR | Status: DC | PRN

## 2011-11-27 MED ORDER — SODIUM CHLORIDE 0.9 % IV SOLN: INTRAVENOUS | Status: AC

## 2011-11-27 MED ORDER — ASPIRIN 81 MG PO CHEW
324.0000 mg | CHEWABLE_TABLET | ORAL | Status: AC
  Administered 2011-11-27: 324 mg via ORAL

## 2011-11-27 MED ORDER — SODIUM CHLORIDE 0.9 % IV SOLN
INTRAVENOUS | Status: DC
  Administered 2011-11-27: 08:00:00 via INTRAVENOUS

## 2011-11-27 NOTE — Interval H&P Note (Signed)
History and Physical Interval Note:  11/27/2011 8:42 AM  [name]  has presented today for surgery, with the diagnosis of cad  The various methods of treatment have been discussed with the patient and family. After consideration of risks, benefits and other options for treatment, the patient has consented to  Procedure(s) (LRB): JV LEFT HEART CATHETERIZATION WITH CORONARY ANGIOGRAM (N/A) as a surgical intervention .  The patients' history has been reviewed, patient examined, no change in status, stable for surgery.  I have reviewed the patients' chart and labs.  Questions were answered to the patient's satisfaction.     [name]

## 2011-11-27 NOTE — Progress Notes (Signed)
Bedrest begins @ 0940, tegaderm dressing applied to right groin by Venda Rodes.

## 2011-11-27 NOTE — CV Procedure (Signed)
   Cardiac Catheterization Operative Report  [name] [id number] 6/12/20139:22 AM Gwen Pounds, MD  Procedure Performed:  1. Left Heart Catheterization 2. Selective Coronary Angiography 3. Left ventricular angiogram  Operator: Verne Carrow, MD  Indication:   Chest pain concerning for unstable angina, weakness, history of CAD with prior DES in the RCA, Diagonal and LAD in 2008.                                     Procedure Details: The risks, benefits, complications, treatment options, and expected outcomes were discussed with the patient. The patient and/or family concurred with the proposed plan, giving informed consent. The patient was brought to the cath lab after IV hydration was begun and oral premedication was given. The patient was further sedated with Versed and Fentanyl. The right groin was prepped and draped in the usual manner. Using the modified Seldinger access technique, a 4 French sheath was placed in the right femoral artery. Standard diagnostic catheters were used to perform selective coronary angiography. A pigtail catheter was used to perform a left ventricular angiogram.  There were no immediate complications. The patient was taken to the recovery area in stable condition.   Hemodynamic Findings: Central aortic pressure: 131/62 Left ventricular pressure: 138/12/23  Angiographic Findings:  Left main:   Left Anterior Descending Artery: Large caliber vessel that courses to the apex. There is a patent stent in the proximal to mid LAD with minimal, 20% in stent restenosis. The diagonal branch arises just before the LAD stent. There is a patent stent in the ostium and proximal segment of the diagonal branch with perhaps 20% ostial stenosis.   Circumflex Artery: Moderate sized vessel with no disease noted. Small early OM branch with no disease.   Right Coronary Artery: Large, dominant vessel with patent stent in the proximal vessel. Just before the  stent, there is a 20% plaque. Just beyond the stent, there is a 30% plaque.   Left Ventricular Angiogram: LVEF 65-70%.    Impression: 1. Stable double vessel CAD with patent stents LAD, Diagonal and RCA 2. Normal LV systolic function.   Recommendations: Will continue medical management.        Complications:  None. The patient tolerated the procedure well.

## 2011-11-27 NOTE — H&P (View-Only) (Signed)
 History of Present Illness: 71 yo WF with h/o CAD with previous drug eluting stents mid LAD, diagonal and proximal/mid RCA as well as HTN, hyperlipidemia, obstructive sleep apnea here today for cardiac follow up. She has been followed in the past by Dr. Tracie Turner with [facility]. I saw her for the first time on 04/22/11. Her cardiac history includes drug eluting stents LAD and Diagonal May 2008, followed by DES RCA in December 2008. Stress test February 2011 in [facility] with no ischemia, normal LVEF at 88%. At her initial visit in our office on 04/22/11, she had c/o left arm pain with walking but not all of the time.  Her prior anginal equivalent was left arm pain. There was no associated SOB, palpitations, near syncope or syncope. I arranged a treadmill stress test on 04/24/11. She exercised for 6 minutes and 34 seconds. There were no ischemic EKG changes. She did have tingling in her arm with exercise but no chest pain.   She returns today for follow up. She has had recurrence of pain in her left arm with exertion.  She does note that her energy level is down over the last few months. She has also had shortness of breath.   Primary Care Physician: John Russo  Last Lipid Profile:  Lipid Panel     Component Value Date/Time   CHOL 134 07/25/2011 0841   TRIG 96.0 07/25/2011 0841   HDL 59.50 07/25/2011 0841   CHOLHDL 2 07/25/2011 0841   VLDL 19.2 07/25/2011 0841   LDLCALC 55 07/25/2011 0841     Past Medical History  Diagnosis Date  . History of recurrent UTIs   . Hyperlipidemia   . Diverticulosis of colon   . CAD (coronary artery disease)   . Hx of adenomatous colonic polyps   . Sleep apnea     Past Surgical History  Procedure Date  . Angioplasty 5/08 12/08     stent   . Right shoulder surgery   . Cataract left   . Vitrectomy left eye     Current Outpatient Prescriptions  Medication Sig Dispense Refill  . amLODipine (NORVASC) 2.5 MG tablet TAKE ONE TABLET DAILY  30  tablet  8  . aspirin 81 MG tablet Take 1 tablet (81 mg total) by mouth daily.  30 tablet  0  . clopidogrel (PLAVIX) 75 MG tablet TAKE ONE TABLET DAILY GENERIC FOR PLAVIX  30 tablet  8  . cycloSPORINE (RESTASIS) 0.05 % ophthalmic emulsion Place 1 drop into both eyes every 12 (twelve) hours.       . fish oil-omega-3 fatty acids 1000 MG capsule Take 2 capsules by mouth 2 (two) times daily.       . Lactobacillus CAPS Take by mouth daily.        . multivitamin (THERAGRAN) per tablet Take 1 tablet by mouth daily.        . nitroGLYCERIN (NITROLINGUAL DUO PACK) 0.4 MG/SPRAY spray Place 1 spray under the tongue every 5 (five) minutes as needed.        . Psyllium (METAMUCIL) 30.9 % POWD Take by mouth 1 dose over 24 hours.        . rosuvastatin (CRESTOR) 40 MG tablet Take 1 tablet (40 mg total) by mouth daily.  30 tablet  11    Allergies  Allergen Reactions  . Fluoride Preparations     A test for eye to make pictures, causing pressure    History   Social History  .   Marital Status: Married    Spouse Name: N/A    Number of Children: 0  . Years of Education: N/A   Occupational History  . retired     Social History Main Topics  . Smoking status: Never Smoker   . Smokeless tobacco: Not on file  . Alcohol Use: 1.0 - 1.5 oz/week    2-3 drink(s) per week  . Drug Use: No  . Sexually Active: Not on file   Other Topics Concern  . Not on file   Social History Narrative  . No narrative on file    Family History  Problem Relation Age of Onset  . Colon polyps Brother   . Heart disease Brother   . Colon polyps Paternal Grandfather   . Heart disease Father   . Heart disease Brother   . Heart disease Mother   . Heart disease Other     Grandmother   . Cancer Neg Hx     colon    Review of Systems:  As stated in the HPI and otherwise negative.   BP 149/92  Pulse 70  Ht 5' 2" (1.575 m)  Wt 169 lb (76.658 kg)  BMI 30.91 kg/m2  Physical Examination: General: Well developed, well  nourished, NAD HEENT: OP clear, mucus membranes moist SKIN: warm, dry. No rashes. Neuro: No focal deficits Musculoskeletal: Muscle strength 5/5 all ext Psychiatric: Mood and affect normal Neck: No JVD, no carotid bruits, no thyromegaly, no lymphadenopathy. Lungs:Clear bilaterally, no wheezes, rhonci, crackles Cardiovascular: Regular rate and rhythm. No murmurs, gallops or rubs. Abdomen:Soft. Bowel sounds present. Non-tender.  Extremities: No lower extremity edema. Pulses are 2 + in the bilateral DP/PT.  EKG: NSR, rate 70 bpm. 

## 2011-12-11 ENCOUNTER — Ambulatory Visit
Admission: RE | Admit: 2011-12-11 | Discharge: 2011-12-11 | Disposition: A | Discharge status: Home / Self Care | Payer: Medicare Other | Source: Ambulatory Visit | Attending: Internal Medicine | Admitting: Internal Medicine

## 2011-12-11 DIAGNOSIS — Z1231 Encounter for screening mammogram for malignant neoplasm of breast: Secondary | ICD-10-CM

## 2011-12-18 ENCOUNTER — Encounter: Payer: Medicare Other | Admitting: Cardiovascular Disease

## 2012-04-13 ENCOUNTER — Other Ambulatory Visit: Payer: Self-pay | Admitting: Cardiovascular Disease

## 2012-06-08 ENCOUNTER — Other Ambulatory Visit: Payer: Medicare Other

## 2012-06-09 ENCOUNTER — Other Ambulatory Visit: Payer: Self-pay | Admitting: *Deleted

## 2012-06-09 DIAGNOSIS — E785 Hyperlipidemia, unspecified: Secondary | ICD-10-CM

## 2012-06-09 DIAGNOSIS — I10 Essential (primary) hypertension: Secondary | ICD-10-CM

## 2012-06-09 DIAGNOSIS — I2510 Atherosclerotic heart disease of native coronary artery without angina pectoris: Secondary | ICD-10-CM

## 2012-06-15 ENCOUNTER — Other Ambulatory Visit (INDEPENDENT_AMBULATORY_CARE_PROVIDER_SITE_OTHER): Payer: Medicare Other

## 2012-06-15 DIAGNOSIS — E785 Hyperlipidemia, unspecified: Secondary | ICD-10-CM

## 2012-06-15 DIAGNOSIS — I10 Essential (primary) hypertension: Secondary | ICD-10-CM

## 2012-06-15 DIAGNOSIS — I2510 Atherosclerotic heart disease of native coronary artery without angina pectoris: Secondary | ICD-10-CM

## 2012-06-15 LAB — HEPATIC FUNCTION PANEL
ALT: 27 U/L (ref 0–35)
AST: 28 U/L (ref 0–37)
Albumin: 4.2 g/dL (ref 3.5–5.2)
Alkaline Phosphatase: 52 U/L (ref 39–117)
Bilirubin, Direct: 0.1 mg/dL (ref 0.0–0.3)
Total Bilirubin: 0.7 mg/dL (ref 0.3–1.2)
Total Protein: 7.1 g/dL (ref 6.0–8.3)

## 2012-06-15 LAB — BASIC METABOLIC PANEL
BUN: 16 mg/dL (ref 6–23)
CO2: 26 mEq/L (ref 19–32)
Calcium: 9.1 mg/dL (ref 8.4–10.5)
Chloride: 105 mEq/L (ref 96–112)
Creatinine, Ser: 1 mg/dL (ref 0.4–1.2)
GFR: 61.39 mL/min (ref >60.00)
Glucose, Bld: 101 mg/dL — ABNORMAL HIGH (ref 70–99)
Potassium: 3.8 mEq/L (ref 3.5–5.1)
Sodium: 140 mEq/L (ref 135–145)

## 2012-06-15 LAB — LIPID PANEL
Cholesterol: 144 mg/dL (ref 0–200)
HDL: 51.3 mg/dL (ref >39.00)
LDL Cholesterol: 71 mg/dL (ref 0–99)
Total CHOL/HDL Ratio: 3
Triglycerides: 111 mg/dL (ref 0.0–149.0)
VLDL: 22.2 mg/dL (ref 0.0–40.0)

## 2012-06-16 LAB — CBC WITH DIFFERENTIAL/PLATELET
Basophils Absolute: 0.2 10*3/uL — ABNORMAL HIGH (ref 0.0–0.1)
Basophils Relative: 2 % (ref 0.0–3.0)
Eosinophils Absolute: 0.3 10*3/uL (ref 0.0–0.7)
Eosinophils Relative: 3.8 % (ref 0.0–5.0)
HCT: 40.6 % (ref 36.0–46.0)
Hemoglobin: 13.6 g/dL (ref 12.0–15.0)
Lymphocytes Relative: 47.2 % — ABNORMAL HIGH (ref 12.0–46.0)
Lymphs Abs: 3.9 10*3/uL (ref 0.7–4.0)
MCHC: 33.5 g/dL (ref 30.0–36.0)
MCV: 93 fl (ref 78.0–100.0)
Monocytes Absolute: 0.5 10*3/uL (ref 0.1–1.0)
Monocytes Relative: 5.9 % (ref 3.0–12.0)
Neutro Abs: 3.4 10*3/uL (ref 1.4–7.7)
Neutrophils Relative %: 41.1 % — ABNORMAL LOW (ref 43.0–77.0)
Platelets: 307 10*3/uL (ref 150.0–400.0)
RBC: 4.37 Mil/uL (ref 3.87–5.11)
RDW: 13.6 % (ref 11.5–14.6)
WBC: 8.4 10*3/uL (ref 4.5–10.5)

## 2012-06-18 ENCOUNTER — Encounter: Payer: Self-pay | Admitting: Cardiovascular Disease

## 2012-06-18 ENCOUNTER — Ambulatory Visit (INDEPENDENT_AMBULATORY_CARE_PROVIDER_SITE_OTHER): Payer: Medicare Other | Admitting: Cardiovascular Disease

## 2012-06-18 VITALS — BP 123/78 | HR 73 | Ht 63.0 in | Wt 170.0 lb

## 2012-06-18 DIAGNOSIS — E785 Hyperlipidemia, unspecified: Secondary | ICD-10-CM

## 2012-06-18 DIAGNOSIS — I2510 Atherosclerotic heart disease of native coronary artery without angina pectoris: Secondary | ICD-10-CM

## 2012-06-18 DIAGNOSIS — I10 Essential (primary) hypertension: Secondary | ICD-10-CM

## 2012-06-18 MED ORDER — ROSUVASTATIN CALCIUM 40 MG PO TABS: 40.0000 mg | ORAL_TABLET | ORAL | Status: DC

## 2012-06-18 NOTE — Progress Notes (Signed)
History of Present Illness: 72 yo WF with h/o CAD with previous drug eluting stents mid LAD, diagonal and proximal/mid RCA as well as HTN, hyperlipidemia, obstructive sleep apnea here today for cardiac follow up. She has been followed in the past by Dr. Cherlyn Labella with [facility]. I saw her for the first time on 04/22/11. Her cardiac history includes drug eluting stents LAD and Diagonal May 2008, followed by DES RCA in December 2008. Stress test February 2011 in [facility] with no ischemia, normal LVEF at 88%. At her initial visit in our office on 04/22/11, she had c/o left arm pain with walking but not all of the time. Her prior anginal equivalent was left arm pain. There was no associated SOB, palpitations, near syncope or syncope. I arranged a treadmill stress test on 04/24/11. She exercised for 6 minutes and 34 seconds. There were no ischemic EKG changes. She did have tingling in her arm with exercise but no chest pain. I saw her June 2013 and she c/o recurrence of pain in her left arm with exertion associated with fatigue and SOB. Cardiac cath in June 2013 with stable disease, patent stents LAD, Diagonal, RCA.   She is here today for follow up. She has had no chest pain or SOB. She does have some aches and pains in her arms and legs on statin.    Primary Care Physician: Creola Corn  Last Lipid Profile:Lipid Panel     Component Value Date/Time   CHOL 144 06/15/2012 0749   TRIG 111.0 06/15/2012 0749   HDL 51.30 06/15/2012 0749   CHOLHDL 3 06/15/2012 0749   VLDL 22.2 06/15/2012 0749   LDLCALC 71 06/15/2012 0749     Past Medical History  Diagnosis Date  . History of recurrent UTIs   . Hyperlipidemia   . Diverticulosis of colon   . CAD (coronary artery disease)   . Hx of adenomatous colonic polyps   . Sleep apnea     Past Surgical History  Procedure Date  . Angioplasty 5/08 12/08     stent   . Right shoulder surgery   . Cataract left   . Vitrectomy left eye      Current Outpatient Prescriptions  Medication Sig Dispense Refill  . amLODipine (NORVASC) 2.5 MG tablet TAKE ONE TABLET DAILY  30 tablet  8  . aspirin 81 MG tablet Take 1 tablet (81 mg total) by mouth daily.  30 tablet  0  . clopidogrel (PLAVIX) 75 MG tablet TAKE ONE TABLET DAILY GENERIC FOR PLAVIX  30 tablet  8  . CRESTOR 40 MG tablet TAKE ONE (1) TABLET EACH DAY  30 tablet  11  . cycloSPORINE (RESTASIS) 0.05 % ophthalmic emulsion Place 1 drop into both eyes every 12 (twelve) hours.       . fish oil-omega-3 fatty acids 1000 MG capsule Take 2 capsules by mouth 2 (two) times daily.       . Lactobacillus CAPS Take by mouth daily.        . Multiple Vitamins-Minerals (CENTRUM CARDIO PO) Take by mouth. Centrum heart healthy multivitamin take one tablet twice a day      . nitroGLYCERIN (NITROLINGUAL DUO PACK) 0.4 MG/SPRAY spray Place 1 spray under the tongue every 5 (five) minutes as needed.        . Psyllium (METAMUCIL) 30.9 % POWD Take by mouth 1 dose over 24 hours.          Allergies  Allergen Reactions  . Fluoride  Preparations     A test for eye to make pictures, causing pressure    History   Social History  . Marital Status: Married    Spouse Name: N/A    Number of Children: 0  . Years of Education: N/A   Occupational History  . retired     Social History Main Topics  . Smoking status: Never Smoker   . Smokeless tobacco: Not on file  . Alcohol Use: 1.0 - 1.5 oz/week    2-3 drink(s) per week  . Drug Use: No  . Sexually Active: Not on file   Other Topics Concern  . Not on file   Social History Narrative  . No narrative on file    Family History  Problem Relation Age of Onset  . Colon polyps Brother   . Heart disease Brother   . Colon polyps Paternal Grandfather   . Heart disease Father   . Heart disease Brother   . Heart disease Mother   . Heart disease Other     Grandmother   . Cancer Neg Hx     colon    Review of Systems:  As stated in the HPI and  otherwise negative.   BP 123/78  Pulse 73  Ht 5\' 3"  (1.6 m)  Wt 170 lb (77.111 kg)  BMI 30.11 kg/m2  Physical Examination: General: Well developed, well nourished, NAD HEENT: OP clear, mucus membranes moist SKIN: warm, dry. No rashes. Neuro: No focal deficits Musculoskeletal: Muscle strength 5/5 all ext Psychiatric: Mood and affect normal Neck: No JVD, no carotid bruits, no thyromegaly, no lymphadenopathy. Lungs:Clear bilaterally, no wheezes, rhonci, crackles Cardiovascular: Regular rate and rhythm. No murmurs, gallops or rubs. Abdomen:Soft. Bowel sounds present. Non-tender.  Extremities: No lower extremity edema. Pulses are 2 + in the bilateral DP/PT.  Cardiac cath 11/26/12: Left main:  Left Anterior Descending Artery: Large caliber vessel that courses to the apex. There is a patent stent in the proximal to mid LAD with minimal, 20% in stent restenosis. The diagonal branch arises just before the LAD stent. There is a patent stent in the ostium and proximal segment of the diagonal branch with perhaps 20% ostial stenosis.  Circumflex Artery: Moderate sized vessel with no disease noted. Small early OM branch with no disease.  Right Coronary Artery: Large, dominant vessel with patent stent in the proximal vessel. Just before the stent, there is a 20% plaque. Just beyond the stent, there is a 30% plaque.  Left Ventricular Angiogram: LVEF 65-70%.   Assessment and Plan:   1. CAD: Recent cardiac cath with stable disease. Continue medical management.   2. HTN: BP is well controlled at home.    3. HYPERLIPIDEMIA:  Lipids well controlled on statin. She describes body aches. Will lower Crestor to 40 mg three times per week.

## 2012-06-18 NOTE — Patient Instructions (Addendum)
Your physician wants you to follow-up in:  6 months. You will receive a reminder letter in the mail two months in advance. If you don't receive a letter, please call our office to schedule the follow-up appointment.  Your physician has recommended you make the following change in your medication:  Decrease Crestor to 3 days per week.

## 2012-07-04 ENCOUNTER — Other Ambulatory Visit: Payer: Self-pay | Admitting: Cardiovascular Disease

## 2012-07-15 ENCOUNTER — Other Ambulatory Visit: Payer: Self-pay | Admitting: Cardiovascular Disease

## 2012-10-12 ENCOUNTER — Telehealth: Payer: Self-pay | Admitting: Cardiovascular Disease

## 2012-10-12 NOTE — Telephone Encounter (Signed)
Patient wants to know if she needs to come to appointment on 7/2 fasting in order to recheck cholesterol since her Crestor dose has been decreased.  Advised patient to come fasting since her appointment is at 8:45 a.m. Patient verbalized understanding.

## 2012-10-12 NOTE — Telephone Encounter (Signed)
New problem   Pt want to know if she needd to have labs drawn before her appt there were no orders in system. Please call pt.

## 2012-11-05 ENCOUNTER — Other Ambulatory Visit: Payer: Self-pay

## 2012-11-05 DIAGNOSIS — Z1231 Encounter for screening mammogram for malignant neoplasm of breast: Secondary | ICD-10-CM

## 2012-12-15 ENCOUNTER — Encounter: Payer: Self-pay | Admitting: *Deleted

## 2012-12-16 ENCOUNTER — Encounter: Payer: Self-pay | Admitting: Cardiovascular Disease

## 2012-12-16 ENCOUNTER — Ambulatory Visit
Admission: RE | Admit: 2012-12-16 | Discharge: 2012-12-16 | Disposition: A | Discharge status: Home / Self Care | Payer: Medicare Other | Source: Ambulatory Visit

## 2012-12-16 ENCOUNTER — Ambulatory Visit (INDEPENDENT_AMBULATORY_CARE_PROVIDER_SITE_OTHER): Payer: Medicare Other | Admitting: Cardiovascular Disease

## 2012-12-16 VITALS — BP 110/79 | HR 63 | Ht 62.0 in | Wt 169.0 lb

## 2012-12-16 DIAGNOSIS — Z1231 Encounter for screening mammogram for malignant neoplasm of breast: Secondary | ICD-10-CM

## 2012-12-16 DIAGNOSIS — I2510 Atherosclerotic heart disease of native coronary artery without angina pectoris: Secondary | ICD-10-CM

## 2012-12-16 DIAGNOSIS — I10 Essential (primary) hypertension: Secondary | ICD-10-CM

## 2012-12-16 DIAGNOSIS — E785 Hyperlipidemia, unspecified: Secondary | ICD-10-CM

## 2012-12-16 LAB — LIPID PANEL
Cholesterol: 159 mg/dL (ref 0–200)
HDL: 56 mg/dL (ref >39.00)
LDL Cholesterol: 77 mg/dL (ref 0–99)
Total CHOL/HDL Ratio: 3
Triglycerides: 132 mg/dL (ref 0.0–149.0)
VLDL: 26.4 mg/dL (ref 0.0–40.0)

## 2012-12-16 LAB — HEPATIC FUNCTION PANEL
ALT: 26 U/L (ref 0–35)
AST: 26 U/L (ref 0–37)
Albumin: 4.3 g/dL (ref 3.5–5.2)
Alkaline Phosphatase: 54 U/L (ref 39–117)
Bilirubin, Direct: 0 mg/dL (ref 0.0–0.3)
Total Bilirubin: 0.6 mg/dL (ref 0.3–1.2)
Total Protein: 7.3 g/dL (ref 6.0–8.3)

## 2012-12-16 NOTE — Progress Notes (Signed)
History of Present Illness: 72 yo WF with h/o CAD with previous drug eluting stents mid LAD, diagonal and proximal/mid RCA as well as HTN, hyperlipidemia, obstructive sleep apnea here today for cardiac follow up. She has been followed in the past by Dr. Cherlyn Labella with [facility]. I saw her for the first time on 04/22/11. Her cardiac history includes drug eluting stents LAD and Diagonal May 2008, followed by DES RCA in December 2008. Stress test February 2011 in [facility] with no ischemia, normal LVEF at 88%. At her initial visit in our office on 04/22/11, she had c/o left arm pain with walking but not all of the time. Her prior anginal equivalent was left arm pain. There was no associated SOB, palpitations, near syncope or syncope. I arranged a treadmill stress test on 04/24/11. She exercised for 6 minutes and 34 seconds. There were no ischemic EKG changes. She did have tingling in her arm with exercise but no chest pain. I saw her June 2013 and she c/o recurrence of pain in her left arm with exertion associated with fatigue and SOB. Cardiac cath in June 2013 with stable disease, patent stents LAD, Diagonal, RCA.   She is here today for follow up. She has had no chest pain or SOB. She does have some aches and pains in her arms and legs on statin.   Primary Care Physician: Creola Corn  Last Lipid Profile:Lipid Panel     Component Value Date/Time   CHOL 144 06/15/2012 0749   TRIG 111.0 06/15/2012 0749   HDL 51.30 06/15/2012 0749   CHOLHDL 3 06/15/2012 0749   VLDL 22.2 06/15/2012 0749   LDLCALC 71 06/15/2012 0749     Past Medical History  Diagnosis Date  . History of recurrent UTIs   . Hyperlipidemia   . Diverticulosis of colon   . CAD (coronary artery disease)   . Hx of adenomatous colonic polyps   . Sleep apnea     Past Surgical History  Procedure Laterality Date  . Angioplasty  5/08 12/08     stent   . Right shoulder surgery    . Cataract left    . Vitrectomy  left eye      Current Outpatient Prescriptions  Medication Sig Dispense Refill  . amLODipine (NORVASC) 2.5 MG tablet TAKE ONE TABLET DAILY  30 tablet  8  . clopidogrel (PLAVIX) 75 MG tablet TAKE ONE TABLET DAILY GENERIC FOR PLAVIX  30 tablet  8  . cycloSPORINE (RESTASIS) 0.05 % ophthalmic emulsion Place 1 drop into both eyes every 12 (twelve) hours.       . fish oil-omega-3 fatty acids 1000 MG capsule Take 2 capsules by mouth 2 (two) times daily.       . Lactobacillus CAPS Take by mouth daily.        . Multiple Vitamins-Minerals (CENTRUM CARDIO PO) Take by mouth. Centrum heart healthy multivitamin take one tablet twice a day      . nitroGLYCERIN (NITROLINGUAL DUO PACK) 0.4 MG/SPRAY spray Place 1 spray under the tongue every 5 (five) minutes as needed.        . Psyllium (METAMUCIL) 30.9 % POWD Take by mouth 1 dose over 24 hours.        . rosuvastatin (CRESTOR) 40 MG tablet Take 1 tablet (40 mg total) by mouth 3 (three) times a week.  15 tablet  6   No current facility-administered medications for this visit.    Allergies  Allergen Reactions  .  Fluoride Preparations     A test for eye to make pictures, causing pressure    History   Social History  . Marital Status: Married    Spouse Name: N/A    Number of Children: 0  . Years of Education: N/A   Occupational History  . retired     Social History Main Topics  . Smoking status: Never Smoker   . Smokeless tobacco: Not on file  . Alcohol Use: 1 - 1.5 oz/week    2-3 drink(s) per week  . Drug Use: No  . Sexually Active: Not on file   Other Topics Concern  . Not on file   Social History Narrative  . No narrative on file    Family History  Problem Relation Age of Onset  . Colon polyps Brother   . Heart disease Brother   . Colon polyps Paternal Grandfather   . Heart disease Father   . Heart disease Brother   . Heart disease Mother   . Heart disease Other     Grandmother   . Cancer Neg Hx     colon    Review of  Systems:  As stated in the HPI and otherwise negative.   BP 110/79  Pulse 63  Ht 5\' 2"  (1.575 m)  Wt 169 lb (76.658 kg)  BMI 30.9 kg/m2  Physical Examination: General: Well developed, well nourished, NAD HEENT: OP clear, mucus membranes moist SKIN: warm, dry. No rashes. Neuro: No focal deficits Musculoskeletal: Muscle strength 5/5 all ext Psychiatric: Mood and affect normal Neck: No JVD, no carotid bruits, no thyromegaly, no lymphadenopathy. Lungs:Clear bilaterally, no wheezes, rhonci, crackles Cardiovascular: Regular rate and rhythm. No murmurs, gallops or rubs. Abdomen:Soft. Bowel sounds present. Non-tender.  Extremities: No lower extremity edema. Pulses are 2 + in the bilateral DP/PT.  EKG: NSR, rate 63 bpm.   Cardiac cath 11/27/11:  Left main:  Left Anterior Descending Artery: Large caliber vessel that courses to the apex. There is a patent stent in the proximal to mid LAD with minimal, 20% in stent restenosis. The diagonal branch arises just before the LAD stent. There is a patent stent in the ostium and proximal segment of the diagonal branch with perhaps 20% ostial stenosis.  Circumflex Artery: Moderate sized vessel with no disease noted. Small early OM branch with no disease.  Right Coronary Artery: Large, dominant vessel with patent stent in the proximal vessel. Just before the stent, there is a 20% plaque. Just beyond the stent, there is a 30% plaque.  Left Ventricular Angiogram: LVEF 65-70%.   Assessment and Plan:   1. CAD: Cardiac cath June 2013 with stable disease. Continue medical management. She has chosen to use ASA 325 mg po Qdaily.   2. HTN: BP is well controlled  3. HYPERLIPIDEMIA: Lipids well controlled on statin. Crestor lowered at last visit. Will check lipids and LFTs today.

## 2012-12-16 NOTE — Patient Instructions (Addendum)
Your physician wants you to follow-up in:  6 months. You will receive a reminder letter in the mail two months in advance. If you don't receive a letter, please call our office to schedule the follow-up appointment.   

## 2012-12-27 ENCOUNTER — Emergency Department (HOSPITAL_COMMUNITY): Payer: Medicare Other

## 2012-12-27 ENCOUNTER — Observation Stay (HOSPITAL_COMMUNITY)
Admission: EM | Admit: 2012-12-27 | Discharge: 2012-12-28 | Disposition: A | Discharge status: Home / Self Care | Payer: Medicare Other | Attending: Internal Medicine | Admitting: Internal Medicine

## 2012-12-27 ENCOUNTER — Encounter (HOSPITAL_COMMUNITY): Payer: Self-pay | Admitting: Emergency Medicine

## 2012-12-27 DIAGNOSIS — S20222A Contusion of left back wall of thorax, initial encounter: Secondary | ICD-10-CM

## 2012-12-27 DIAGNOSIS — M25519 Pain in unspecified shoulder: Principal | ICD-10-CM | POA: Insufficient documentation

## 2012-12-27 DIAGNOSIS — Z79899 Other long term (current) drug therapy: Secondary | ICD-10-CM | POA: Insufficient documentation

## 2012-12-27 DIAGNOSIS — I10 Essential (primary) hypertension: Secondary | ICD-10-CM | POA: Insufficient documentation

## 2012-12-27 DIAGNOSIS — G4733 Obstructive sleep apnea (adult) (pediatric): Secondary | ICD-10-CM | POA: Insufficient documentation

## 2012-12-27 DIAGNOSIS — Z7982 Long term (current) use of aspirin: Secondary | ICD-10-CM | POA: Insufficient documentation

## 2012-12-27 DIAGNOSIS — M25512 Pain in left shoulder: Secondary | ICD-10-CM

## 2012-12-27 DIAGNOSIS — D72829 Elevated white blood cell count, unspecified: Secondary | ICD-10-CM | POA: Insufficient documentation

## 2012-12-27 DIAGNOSIS — Z860100 Personal history of colon polyps, unspecified: Secondary | ICD-10-CM | POA: Insufficient documentation

## 2012-12-27 DIAGNOSIS — K5730 Diverticulosis of large intestine without perforation or abscess without bleeding: Secondary | ICD-10-CM | POA: Insufficient documentation

## 2012-12-27 DIAGNOSIS — E785 Hyperlipidemia, unspecified: Secondary | ICD-10-CM | POA: Insufficient documentation

## 2012-12-27 DIAGNOSIS — R0789 Other chest pain: Secondary | ICD-10-CM | POA: Insufficient documentation

## 2012-12-27 DIAGNOSIS — S9031XA Contusion of right foot, initial encounter: Secondary | ICD-10-CM

## 2012-12-27 DIAGNOSIS — I2510 Atherosclerotic heart disease of native coronary artery without angina pectoris: Secondary | ICD-10-CM | POA: Insufficient documentation

## 2012-12-27 DIAGNOSIS — R079 Chest pain, unspecified: Secondary | ICD-10-CM

## 2012-12-27 LAB — COMPREHENSIVE METABOLIC PANEL
ALT: 22 U/L (ref 0–35)
AST: 23 U/L (ref 0–37)
Albumin: 3.8 g/dL (ref 3.5–5.2)
Alkaline Phosphatase: 63 U/L (ref 39–117)
BUN: 17 mg/dL (ref 6–23)
CO2: 25 mEq/L (ref 19–32)
Calcium: 9.5 mg/dL (ref 8.4–10.5)
Chloride: 102 mEq/L (ref 96–112)
Creatinine, Ser: 0.8 mg/dL (ref 0.50–1.10)
GFR calc Af Amer: 83 mL/min — ABNORMAL LOW (ref >90)
GFR calc non Af Amer: 72 mL/min — ABNORMAL LOW (ref >90)
Glucose, Bld: 96 mg/dL (ref 70–99)
Potassium: 3.9 mEq/L (ref 3.5–5.1)
Sodium: 138 mEq/L (ref 135–145)
Total Bilirubin: 0.3 mg/dL (ref 0.3–1.2)
Total Protein: 7.3 g/dL (ref 6.0–8.3)

## 2012-12-27 LAB — CREATININE, SERUM
Creatinine, Ser: 0.82 mg/dL (ref 0.50–1.10)
GFR calc Af Amer: 81 mL/min — ABNORMAL LOW (ref >90)
GFR calc non Af Amer: 70 mL/min — ABNORMAL LOW (ref >90)

## 2012-12-27 LAB — CBC
HCT: 40.2 % (ref 36.0–46.0)
Hemoglobin: 13.1 g/dL (ref 12.0–15.0)
MCH: 30.4 pg (ref 26.0–34.0)
MCHC: 32.6 g/dL (ref 30.0–36.0)
MCV: 93.3 fL (ref 78.0–100.0)
Platelets: 294 10*3/uL (ref 150–400)
RBC: 4.31 MIL/uL (ref 3.87–5.11)
RDW: 14 % (ref 11.5–15.5)
WBC: 13.2 10*3/uL — ABNORMAL HIGH (ref 4.0–10.5)

## 2012-12-27 LAB — URINALYSIS, ROUTINE W REFLEX MICROSCOPIC
Bilirubin Urine: NEGATIVE
Glucose, UA: NEGATIVE mg/dL
Ketones, ur: NEGATIVE mg/dL
Leukocytes, UA: NEGATIVE
Nitrite: NEGATIVE
Protein, ur: NEGATIVE mg/dL
Specific Gravity, Urine: 1.013 (ref 1.005–1.030)
Urobilinogen, UA: 0.2 mg/dL (ref 0.0–1.0)
pH: 6.5 (ref 5.0–8.0)

## 2012-12-27 LAB — CBC WITH DIFFERENTIAL/PLATELET
Basophils Absolute: 0.1 10*3/uL (ref 0.0–0.1)
Basophils Relative: 1 % (ref 0–1)
Eosinophils Absolute: 0.1 10*3/uL (ref 0.0–0.7)
Eosinophils Relative: 1 % (ref 0–5)
HCT: 41.3 % (ref 36.0–46.0)
Hemoglobin: 13.7 g/dL (ref 12.0–15.0)
Lymphocytes Relative: 22 % (ref 12–46)
Lymphs Abs: 3.4 10*3/uL (ref 0.7–4.0)
MCH: 30.6 pg (ref 26.0–34.0)
MCHC: 33.2 g/dL (ref 30.0–36.0)
MCV: 92.2 fL (ref 78.0–100.0)
Monocytes Absolute: 1.2 10*3/uL — ABNORMAL HIGH (ref 0.1–1.0)
Monocytes Relative: 8 % (ref 3–12)
Neutro Abs: 10.6 10*3/uL — ABNORMAL HIGH (ref 1.7–7.7)
Neutrophils Relative %: 69 % (ref 43–77)
Platelets: 303 10*3/uL (ref 150–400)
RBC: 4.48 MIL/uL (ref 3.87–5.11)
RDW: 13.7 % (ref 11.5–15.5)
WBC: 15.4 10*3/uL — ABNORMAL HIGH (ref 4.0–10.5)

## 2012-12-27 LAB — TROPONIN I
Troponin I: <0.3 ng/mL (ref <0.30)
Troponin I: <0.3 ng/mL (ref <0.30)
Troponin I: <0.3 ng/mL (ref <0.30)

## 2012-12-27 LAB — URINE MICROSCOPIC-ADD ON

## 2012-12-27 LAB — LIPASE, BLOOD: Lipase: 49 U/L (ref 11–59)

## 2012-12-27 MED ORDER — ASPIRIN EC 81 MG PO TBEC
81.0000 mg | DELAYED_RELEASE_TABLET | Freq: Every day | ORAL | Status: DC
  Filled 2012-12-27: qty 1

## 2012-12-27 MED ORDER — ATORVASTATIN CALCIUM 40 MG PO TABS
40.0000 mg | ORAL_TABLET | Freq: Every day | ORAL | Status: DC
  Administered 2012-12-27: 40 mg via ORAL
  Filled 2012-12-27 (×2): qty 1

## 2012-12-27 MED ORDER — AMLODIPINE BESYLATE 2.5 MG PO TABS
2.5000 mg | ORAL_TABLET | Freq: Every day | ORAL | Status: DC
  Administered 2012-12-27: 2.5 mg via ORAL
  Filled 2012-12-27 (×2): qty 1

## 2012-12-27 MED ORDER — SODIUM CHLORIDE 0.9 % IV SOLN
INTRAVENOUS | Status: DC
  Administered 2012-12-27: 10 mL/h via INTRAVENOUS

## 2012-12-27 MED ORDER — VITAMIN C 500 MG PO TABS
500.0000 mg | ORAL_TABLET | Freq: Every day | ORAL | Status: DC
  Administered 2012-12-27: 500 mg via ORAL
  Filled 2012-12-27 (×2): qty 1

## 2012-12-27 MED ORDER — VITAMIN B-12 1000 MCG PO TABS
1000.0000 ug | ORAL_TABLET | Freq: Every day | ORAL | Status: DC
  Administered 2012-12-27: 1000 ug via ORAL
  Filled 2012-12-27 (×2): qty 1

## 2012-12-27 MED ORDER — CYCLOSPORINE 0.05 % OP EMUL
1.0000 [drp] | Freq: Every day | OPHTHALMIC | Status: DC | PRN
  Filled 2012-12-27: qty 1

## 2012-12-27 MED ORDER — NITROGLYCERIN 0.4 MG SL SUBL: 0.4000 mg | SUBLINGUAL_TABLET | SUBLINGUAL | Status: DC | PRN

## 2012-12-27 MED ORDER — IOHEXOL 300 MG/ML  SOLN
100.0000 mL | Freq: Once | INTRAMUSCULAR | Status: AC | PRN
  Administered 2012-12-27: 100 mL via INTRAVENOUS

## 2012-12-27 MED ORDER — VITAMIN D3 25 MCG (1000 UNIT) PO TABS
1000.0000 [IU] | ORAL_TABLET | Freq: Every day | ORAL | Status: DC
  Administered 2012-12-27: 1000 [IU] via ORAL
  Filled 2012-12-27 (×2): qty 1

## 2012-12-27 MED ORDER — ACETAMINOPHEN 325 MG PO TABS
650.0000 mg | ORAL_TABLET | ORAL | Status: DC | PRN
  Administered 2012-12-27: 650 mg via ORAL
  Filled 2012-12-27: qty 2

## 2012-12-27 MED ORDER — MORPHINE SULFATE 4 MG/ML IJ SOLN
4.0000 mg | Freq: Once | INTRAMUSCULAR | Status: AC
  Administered 2012-12-27: 4 mg via INTRAVENOUS
  Filled 2012-12-27: qty 1

## 2012-12-27 MED ORDER — ASPIRIN 81 MG PO CHEW
324.0000 mg | CHEWABLE_TABLET | Freq: Once | ORAL | Status: AC
  Administered 2012-12-27: 324 mg via ORAL
  Filled 2012-12-27: qty 4

## 2012-12-27 MED ORDER — CALCIUM CARBONATE 600 MG PO TABS
600.0000 mg | ORAL_TABLET | Freq: Two times a day (BID) | ORAL | Status: DC
Start: 2012-12-27 — End: 2012-12-27
  Filled 2012-12-27 (×2): qty 1

## 2012-12-27 MED ORDER — HEPARIN SODIUM (PORCINE) 5000 UNIT/ML IJ SOLN
5000.0000 [IU] | Freq: Three times a day (TID) | INTRAMUSCULAR | Status: DC
  Administered 2012-12-27 – 2012-12-28 (×2): 5000 [IU] via SUBCUTANEOUS
  Filled 2012-12-27 (×5): qty 1

## 2012-12-27 MED ORDER — FENTANYL CITRATE 0.05 MG/ML IJ SOLN
50.0000 ug | Freq: Once | INTRAMUSCULAR | Status: AC
  Administered 2012-12-27: 50 ug via INTRAVENOUS
  Filled 2012-12-27: qty 2

## 2012-12-27 MED ORDER — CALCIUM CARBONATE 1250 (500 CA) MG PO TABS
1.0000 | ORAL_TABLET | Freq: Two times a day (BID) | ORAL | Status: DC
  Administered 2012-12-27: 500 mg via ORAL
  Filled 2012-12-27 (×4): qty 1

## 2012-12-27 MED ORDER — ONDANSETRON HCL 4 MG/2ML IJ SOLN
4.0000 mg | Freq: Four times a day (QID) | INTRAMUSCULAR | Status: DC | PRN
  Filled 2012-12-27: qty 2

## 2012-12-27 MED ORDER — GABAPENTIN 100 MG PO CAPS
100.0000 mg | ORAL_CAPSULE | Freq: Three times a day (TID) | ORAL | Status: DC
  Administered 2012-12-27 (×2): 100 mg via ORAL
  Filled 2012-12-27 (×5): qty 1

## 2012-12-27 MED ORDER — CLOPIDOGREL BISULFATE 75 MG PO TABS
75.0000 mg | ORAL_TABLET | Freq: Every day | ORAL | Status: DC
  Administered 2012-12-27: 75 mg via ORAL
  Filled 2012-12-27 (×2): qty 1

## 2012-12-27 MED ORDER — ADULT MULTIVITAMIN W/MINERALS CH
1.0000 | ORAL_TABLET | Freq: Every day | ORAL | Status: DC
  Administered 2012-12-27: 1 via ORAL
  Filled 2012-12-27 (×2): qty 1

## 2012-12-27 MED ORDER — NITROGLYCERIN 0.4 MG SL SUBL
0.4000 mg | SUBLINGUAL_TABLET | SUBLINGUAL | Status: DC | PRN
  Filled 2012-12-27: qty 25

## 2012-12-27 MED ORDER — ONDANSETRON HCL 4 MG/2ML IJ SOLN
4.0000 mg | Freq: Three times a day (TID) | INTRAMUSCULAR | Status: DC
  Administered 2012-12-27: 4 mg via INTRAVENOUS
  Filled 2012-12-27: qty 2

## 2012-12-27 NOTE — H&P (Signed)
History and Physical  Patient ID: [name] MRN: [id number], SOB: [birth year] 72 y.o. Date of Encounter: 12/27/2012, 2:21 PM  Primary Physician: Julie Pounds, MD Primary Cardiologist: [facility]   Chief Complaint:  L shoulder pain  History of Present Illness: [name] is a 72 y.o. female seen at the request of the emergency room because of shoulder pain following a fall \ The fall occurred Thursday. She fell on a deck in her her back. Last night she went to bed and while drifting off to sleep she became aware of severe left shoulder pain. It moved into her left chest anteriorly and posteriorly. As distinct from her anginal equivalent, it did not radiate into her neck. It has persisted without relief since then, now about 18 hours. Troponins are negative. ECG is normal.2  Her past cardiac history is notable for stenting of her LAD in her diagonal May 2008 her RCA December 2008. Stress test for 2011-[facility]-no ischemia and normal LV function. Because of recurrent problems with left arm pain which had been her anginal equivalent she underwent treadmill testing November 2012 which was nonischemic are associated with left arm pain; socially she underwent catheterization June 2013 with stable disease. This was accompanied also by shortness of breath  She saw Dr. Salena Saner. [facility] last week; complaints at that time included aches and pains. These were attributed to statins.   Past Medical History  Diagnosis Date  . History of recurrent UTIs   . Hyperlipidemia   . Diverticulosis of colon   . CAD (coronary artery disease)   . Hx of adenomatous colonic polyps   . Sleep apnea      Past Surgical History  Procedure Laterality Date  . Angioplasty  5/08 12/08     stent   . Right shoulder surgery    . Cataract left    . Vitrectomy left eye        Current Facility-Administered Medications  Medication Dose Route Frequency Provider Last Rate Last Dose  . morphine 4 MG/ML injection  4 mg  4 mg Intravenous Once Gavin Pound. Ghim, MD      . nitroGLYCERIN (NITROSTAT) SL tablet 0.4 mg  0.4 mg Sublingual Q5 Min x 2 PRN Gavin Pound. Ghim, MD      . ondansetron (ZOFRAN) injection 4 mg  4 mg Intravenous Q8H Gavin Pound. Ghim, MD   4 mg at 12/27/12 1052   Current Outpatient Prescriptions  Medication Sig Dispense Refill  . amLODipine (NORVASC) 2.5 MG tablet Take 2.5 mg by mouth daily.      Marland Kitchen aspirin 325 MG tablet Take 325 mg by mouth daily.      . calcium carbonate (OS-CAL) 600 MG TABS Take 600 mg by mouth 2 (two) times daily with a meal.      . cholecalciferol (VITAMIN D) 1000 UNITS tablet Take 1,000 Units by mouth daily.      . clopidogrel (PLAVIX) 75 MG tablet Take 75 mg by mouth daily.      . cycloSPORINE (RESTASIS) 0.05 % ophthalmic emulsion Place 1 drop into both eyes daily as needed (for dry eyes).      . fish oil-omega-3 fatty acids 1000 MG capsule Take 1 g by mouth 2 (two) times daily.      Marland Kitchen gabapentin (NEURONTIN) 100 MG capsule Take 100 mg by mouth 3 (three) times daily.      . Multiple Vitamin (MULTIVITAMIN WITH MINERALS) TABS Take 1 tablet by mouth daily.      Marland Kitchen  nitroGLYCERIN (NITROSTAT) 0.4 MG SL tablet Place 0.4 mg under the tongue every 5 (five) minutes as needed for chest pain.      . Probiotic Product (PROBIOTIC DAILY PO) Take 1 capsule by mouth daily.      . Psyllium (METAMUCIL) 30.9 % POWD Take by mouth 1 dose over 24 hours.        . rosuvastatin (CRESTOR) 40 MG tablet Take 40 mg by mouth every other day.      . vitamin B-12 (CYANOCOBALAMIN) 1000 MCG tablet Take 1,000 mcg by mouth daily.      . vitamin C (ASCORBIC ACID) 500 MG tablet Take 500 mg by mouth daily.         Allergies: Allergies  Allergen Reactions  . Fluoride Preparations     A test for eye to make pictures, causing pressure     History  Substance Use Topics  . Smoking status: Never Smoker   . Smokeless tobacco: Not on file  . Alcohol Use: 1 - 1.5 oz/week    2-3 drink(s) per week       Family History  Problem Relation Age of Onset  . Colon polyps Brother   . Heart disease Brother   . Colon polyps Paternal Grandfather   . Heart disease Father   . Heart disease Brother   . Heart disease Mother   . Heart disease Other     Grandmother   . Cancer Neg Hx     colon      ROS:  Please see the history of present illness.   Negative except recent fall  All other systems reviewed and negative.   Vital Signs: Blood pressure 130/69, pulse 86, temperature 98.9 F (37.2 C), temperature source Oral, resp. rate 14, SpO2 91.00%.  PHYSICAL EXAM: General:  Well nourished, well developed female in no acute distress  HEENT: normal Lymph: no adenopathy Neck: no JVD Endocrine:  No thryomegaly Vascular: No carotid bruits; FA pulses 2+ bilaterally without bruits Cardiac:  normal S1, S2; RRR; no murmur Back: without kyphosis/scoliosis, no CVA tenderness Lungs:  clear to auscultation bilaterally, no wheezing, rhonchi or rales Abd: soft, nontender, no hepatomegaly Ext: no edema Musculoskeletal:  No deformities, BUE and BLE strength normal and equal; no LUE tenderness or pain with passive ROM Skin: warm and dry Neuro:  CNs 2-12 intact, no focal abnormalities noted Psych:  Normal affect   EKG: Normal sinus with out STT changes Labs:   Lab Results  Component Value Date   WBC 15.4* 12/27/2012   HGB 13.7 12/27/2012   HCT 41.3 12/27/2012   MCV 92.2 12/27/2012   PLT 303 12/27/2012    Recent Labs Lab 12/27/12 1048  NA 138  K 3.9  CL 102  CO2 25  BUN 17  CREATININE 0.80  CALCIUM 9.5  PROT 7.3  BILITOT 0.3  ALKPHOS 63  ALT 22  AST 23  GLUCOSE 96    Recent Labs  12/27/12 1048  TROPONINI <0.30   Lab Results  Component Value Date   CHOL 159 12/16/2012   HDL 56.00 12/16/2012   LDLCALC 77 12/16/2012   TRIG 132.0 12/16/2012   No results found for this basename: DDIMER   BNP No results found for this basename: probnp       ASSESSMENT AND PLAN:       Atypical  chest pain: The patient presents with left shoulder pain which she recalls is similar to her anginal pain. Having said that that was a number  of years ago and she has had recurrent problems with left arm pain that have prompted cardiac evaluation with negative Myoview scan and a negative catheterization in the past 2 and 1 years respectively. Reviewing the records, this arm pain has been accompanied by some shortness of breath. The duration of the pain in the absence of cardiac enzymes and a normal ECG may be hard pressed to think that this is cardiac. However, her pretest likelihood of heart issues related to her prior stenting and her impression that this pain is similar to her heart pain make me concerned that this shoulder pain could still be cardiac in origin.  Given that, but not withstanding the fact tha it is ongoing and unrelenting, I think we should observe her with serial enzymes. I would not undertake catheterization the absence of objective evidence of ischemia nor would I, given her recent fall and back pain, initiate anticoagulants apart from VTE issues  Will tentatively schedule myoview for am  Coronary Artery disease : prior stent  Leukocytosis  Not clear cause

## 2012-12-27 NOTE — ED Provider Notes (Signed)
History    CSN: [id number] Arrival date & time 12/27/12  4098  First MD Initiated Contact with Patient 12/27/12 [phone]     Chief Complaint  Patient presents with  . Shoulder Pain  . Chest Pain  . Ankle Injury   (Consider location/radiation/quality/duration/timing/severity/associated sxs/prior Treatment) HPI Comments: Pt reports has 3 stenets in heart, last cath 1 year ago, usually doesn't use NTG.  Prior anginal equivalent is left shoulder pain that radiates to chest.  Similar quality of pain, but doesn't radiate to chest as severely.  However did get sweaty, slight nausea and SOB this AM.  Pt and family was thinking left shoulder pain may be related to fall, and chose to come to [facility] . Cardiologist is Dr. Clifton James.  No numbness  Or weakness.  No BM in 2 days.  Pt also hurt top of right foot, but pain ands welling is much improved, able to walk on it.  She fell onto porch steps on Thursday and landed with edge of stair to left middle back and flank area.  No bruising.  Pt is on plavix and aspirin.  Patient is a 72 y.o. female presenting with shoulder pain, chest pain, lower extremity injury, and back pain. The history is provided by the patient and the spouse.  Shoulder Pain This is a new problem. The problem has not changed since onset.Associated symptoms include chest pain, abdominal pain and shortness of breath. Pertinent negatives include no headaches. Associated symptoms comments: Diaphoresis, now improved, slight nausea. Nothing aggravates the symptoms. Relieved by: 1 SL NTG tablet helped slightly.  Chest Pain Pain location:  L chest Pain quality: throbbing   Pain radiates to the back: no   Pain severity:  Mild Onset quality:  Gradual Duration:  3 hours Progression:  Unchanged Chronicity:  Recurrent Context: trauma   Context: not breathing, not eating, not lifting and not raising an arm   Associated symptoms: abdominal pain, back pain, diaphoresis, nausea and shortness of breath     Associated symptoms: no dizziness, no fever, no headache and not vomiting   Ankle Injury Associated symptoms include chest pain, abdominal pain and shortness of breath. Pertinent negatives include no headaches.  Back Pain Location:  Thoracic spine and lumbar spine Quality:  Stiffness and aching Radiates to:  L shoulder Pain severity:  Moderate Pain is:  Same all the time Onset quality:  Sudden Duration:  3 days Progression:  Waxing and waning Chronicity:  New Context: falling and recent injury   Relieved by:  Bed rest, being still and lying down Worsened by:  Deep breathing, bending, movement, twisting and touching Ineffective treatments:  Ibuprofen Associated symptoms: abdominal pain and chest pain   Associated symptoms: no dysuria, no fever and no headaches   Abdominal pain:    Location:  LUQ   Quality:  Dull and aching   Severity:  Mild   Onset quality:  Gradual   Duration:  1 day   Timing:  Constant   Progression:  Waxing and waning  Past Medical History  Diagnosis Date  . History of recurrent UTIs   . Hyperlipidemia   . Diverticulosis of colon   . CAD (coronary artery disease)   . Hx of adenomatous colonic polyps   . Sleep apnea    Past Surgical History  Procedure Laterality Date  . Angioplasty  5/08 12/08     stent   . Right shoulder surgery    . Cataract left    . Vitrectomy left eye  Family History  Problem Relation Age of Onset  . Colon polyps Brother   . Heart disease Brother   . Colon polyps Paternal Grandfather   . Heart disease Father   . Heart disease Brother   . Heart disease Mother   . Heart disease Other     Grandmother   . Cancer Neg Hx     colon   History  Substance Use Topics  . Smoking status: Never Smoker   . Smokeless tobacco: Not on file  . Alcohol Use: 1 - 1.5 oz/week    2-3 drink(s) per week   OB History   Grav Para Term Preterm Abortions TAB SAB Ect Mult Living                 Review of Systems  Constitutional:  Positive for diaphoresis. Negative for fever and chills.  HENT: Negative for congestion, neck pain, neck stiffness and sinus pressure.   Respiratory: Positive for chest tightness and shortness of breath.   Cardiovascular: Positive for chest pain.  Gastrointestinal: Positive for nausea, abdominal pain and constipation. Negative for vomiting and diarrhea.  Genitourinary: Negative for dysuria and flank pain.  Musculoskeletal: Positive for back pain and arthralgias.  Skin: Negative for rash and wound.  Neurological: Negative for dizziness, syncope, light-headedness and headaches.  All other systems reviewed and are negative.    Allergies  Fluoride preparations  Home Medications   Current Outpatient Rx  Name  Route  Sig  Dispense  Refill  . amLODipine (NORVASC) 2.5 MG tablet   Oral   Take 2.5 mg by mouth daily.         Marland Kitchen aspirin 325 MG tablet   Oral   Take 325 mg by mouth daily.         . calcium carbonate (OS-CAL) 600 MG TABS   Oral   Take 600 mg by mouth 2 (two) times daily with a meal.         . cholecalciferol (VITAMIN D) 1000 UNITS tablet   Oral   Take 1,000 Units by mouth daily.         . clopidogrel (PLAVIX) 75 MG tablet   Oral   Take 75 mg by mouth daily.         . cycloSPORINE (RESTASIS) 0.05 % ophthalmic emulsion   Both Eyes   Place 1 drop into both eyes daily as needed (for dry eyes).         . fish oil-omega-3 fatty acids 1000 MG capsule   Oral   Take 1 g by mouth 2 (two) times daily.         Marland Kitchen gabapentin (NEURONTIN) 100 MG capsule   Oral   Take 100 mg by mouth 3 (three) times daily.         . Multiple Vitamin (MULTIVITAMIN WITH MINERALS) TABS   Oral   Take 1 tablet by mouth daily.         . nitroGLYCERIN (NITROSTAT) 0.4 MG SL tablet   Sublingual   Place 0.4 mg under the tongue every 5 (five) minutes as needed for chest pain.         . Probiotic Product (PROBIOTIC DAILY PO)   Oral   Take 1 capsule by mouth daily.         .  Psyllium (METAMUCIL) 30.9 % POWD   Oral   Take by mouth 1 dose over 24 hours.           . rosuvastatin (CRESTOR)  40 MG tablet   Oral   Take 40 mg by mouth every other day.         . vitamin B-12 (CYANOCOBALAMIN) 1000 MCG tablet   Oral   Take 1,000 mcg by mouth daily.         . vitamin C (ASCORBIC ACID) 500 MG tablet   Oral   Take 500 mg by mouth daily.          BP 130/69  Pulse 86  Temp(Src) 98.9 F (37.2 C) (Oral)  Resp 14  SpO2 91% Physical Exam  Nursing note and vitals reviewed. Constitutional: She is oriented to person, place, and time. She appears well-developed and well-nourished. No distress.  HENT:  Head: Normocephalic and atraumatic.  Eyes: Conjunctivae and EOM are normal. No scleral icterus.  Neck: Normal range of motion. Neck supple. No JVD present. No spinous process tenderness present.  Cardiovascular: Normal rate, regular rhythm and intact distal pulses.   No murmur heard. Pulmonary/Chest: Effort normal. No respiratory distress. She has no wheezes.  Abdominal: Soft. Normal appearance. She exhibits no distension. There is tenderness in the left upper quadrant. There is no rebound and no guarding.  Musculoskeletal:       Thoracic back: She exhibits tenderness, bony tenderness and pain. She exhibits no edema, no deformity and no laceration.       Back:  Neurological: She is alert and oriented to person, place, and time. Coordination normal.  Skin: Skin is warm and dry. No rash noted. She is not diaphoretic. No pallor.    ED Course  Procedures (including critical care time) Labs Reviewed  CBC WITH DIFFERENTIAL - Abnormal; Notable for the following:    WBC 15.4 (*)    Neutro Abs 10.6 (*)    Monocytes Absolute 1.2 (*)    All other components within normal limits  COMPREHENSIVE METABOLIC PANEL - Abnormal; Notable for the following:    GFR calc non Af Amer 72 (*)    GFR calc Af Amer 83 (*)    All other components within normal limits  URINALYSIS,  ROUTINE W REFLEX MICROSCOPIC - Abnormal; Notable for the following:    Hgb urine dipstick SMALL (*)    All other components within normal limits  LIPASE, BLOOD  TROPONIN I  URINE MICROSCOPIC-ADD ON   Dg Chest 2 View  12/27/2012   *RADIOLOGY REPORT*  Clinical Data: Shortness of breath and back pain.  CHEST - 2 VIEW  Comparison: 05/25/2007  Findings: There are very low lung lies bilaterally with bibasilar atelectasis present.  No edema, infiltrate or pleural effusion is seen.  The heart size and mediastinal contours are within normal limits.  IMPRESSION: Low lung volumes with bibasilar atelectasis.   Original Report Authenticated By: Irish Lack, M.D.   Dg Thoracic Spine 2 View  12/27/2012   *RADIOLOGY REPORT*  Clinical Data: Fall with back pain.  THORACIC SPINE - 2 VIEW  Comparison: None.  Findings: No acute fracture or subluxation is identified. Osteophytes present in the mid to lower thoracic spine.  No bony lesions identified.  IMPRESSION: No acute fracture identified.  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   Original Report Authenticated By: Irish Lack, M.D.   Ct Abdomen Pelvis W Contrast  12/27/2012   *RADIOLOGY REPORT*  Clinical Data: Fall with left flank pain.  CT ABDOMEN AND PELVIS WITH CONTRAST  Technique:  Multidetector CT imaging of the abdomen and pelvis was performed following the standard protocol during bolus  administration of intravenous contrast.  Contrast: OMNIPAQUE IOHEXOL 300 MG/ML  SOLN  Comparison: None.  Findings: Visualized lung bases show scarring and atelectasis at both bases.  No renal injuries are identified with a normal appearance to both kidneys.  No abnormal fluid collections or hematoma identified. No evidence of bony fractures.  The rest of the abdomen and pelvis is unremarkable including the liver, gallbladder, pancreas, spleen, adrenal glands and bowel.  No hernias, masses or enlarged lymph nodes are seen.  The bladder  is unremarkable.  Atherosclerotic calcifications of the distal abdominal aorta noted without aneurysm.  IMPRESSION: No acute findings.   Original Report Authenticated By: Irish Lack, M.D.   1. Shoulder pain, acute, left   2. Chest pain   3. Contusion, foot, right, initial encounter   4. Back contusion, left, initial encounter     RA sat is 91% and I interpret to be low, inadequate, placed on 2L [place] O2.  ECG at time 0936 shows SR at rate 73, Rsr` in V2 with non specific ST elevation in septal leads, no reciprocal ST depressions, normal intervals.  Normal axis.  Similar in appearance to ECG from 05/28/2007.   12:44 PM Plain films, CT scan reveal no sig injury.  Troponin is normal times 1.  Will discuss with [facility]ology. Pt's shoulder pain is still present, not improved from   MDM  Pt with recent injury to left flank, now with left shoulder pain, could be referred pain from injury to left flank and now has abd pain in LUQ, needs abd CT scan, but also left shoulder pain could be anginal equivalent.  Pt came to [facility] due to misinformed.  Will give analgesics, check troponin, obtain plain films and CT scan and continue to monitor carefully.  ASA and Ntg also given for left shoulder/CP.    Gavin Pound. Ghim, MD 12/27/12 1359

## 2012-12-27 NOTE — ED Notes (Signed)
Pt from home reports that she fell on her steps Thurs afternoon injuring her R ankle and back. Last pm, pt started having R shoulder and chest pain. Pt states that she has cardiac hx with 5 catherizations, 3 stents approx 4-5 years ago. Pt adds that she is having SOB and was diaphoretic this morning. Pt is A&O and in NAD

## 2012-12-27 NOTE — ED Notes (Signed)
Pt adds that she is also having L abd pain and has not had a BM x2 days.

## 2012-12-27 NOTE — ED Notes (Signed)
Pt transferred to x ray

## 2012-12-27 NOTE — ED Notes (Signed)
MD at bedside. 

## 2012-12-27 NOTE — ED Notes (Signed)
Cardiologist at bedside.  

## 2012-12-28 ENCOUNTER — Encounter (HOSPITAL_COMMUNITY): Payer: Self-pay | Admitting: Physician Assistant

## 2012-12-28 ENCOUNTER — Observation Stay (HOSPITAL_COMMUNITY)
Admit: 2012-12-28 | Discharge: 2012-12-28 | Disposition: A | Discharge status: Home / Self Care | Payer: Medicare Other | Attending: Internal Medicine | Admitting: Internal Medicine

## 2012-12-28 ENCOUNTER — Other Ambulatory Visit: Payer: Self-pay

## 2012-12-28 DIAGNOSIS — R079 Chest pain, unspecified: Secondary | ICD-10-CM

## 2012-12-28 DIAGNOSIS — M25519 Pain in unspecified shoulder: Secondary | ICD-10-CM

## 2012-12-28 LAB — TROPONIN I: Troponin I: <0.3 ng/mL (ref <0.30)

## 2012-12-28 MED ORDER — REGADENOSON 0.4 MG/5ML IV SOLN
INTRAVENOUS | Status: AC
  Administered 2012-12-28: 0.4 mg via INTRAVENOUS
  Filled 2012-12-28: qty 5

## 2012-12-28 MED ORDER — ASPIRIN 81 MG PO TABS: 81.0000 mg | ORAL_TABLET | Freq: Every day | ORAL | Status: DC

## 2012-12-28 MED ORDER — TECHNETIUM TC 99M SESTAMIBI GENERIC - CARDIOLITE
10.0000 | Freq: Once | INTRAVENOUS | Status: AC | PRN
  Administered 2012-12-28: 10 via INTRAVENOUS

## 2012-12-28 MED ORDER — TECHNETIUM TC 99M SESTAMIBI GENERIC - CARDIOLITE
30.0000 | Freq: Once | INTRAVENOUS | Status: AC | PRN
  Administered 2012-12-28: 30 via INTRAVENOUS

## 2012-12-28 NOTE — Discharge Summary (Signed)
Discharge Summary   Patient ID: [name] MRN: [id number], DOB/AGE: 1940-07-28 72 y.o. Admit date: 12/27/2012 D/C date:     12/28/2012  Primary Cardiologist: Clifton James  Primary Discharge Diagnoses:  1. Shoulder pain, musculoskeletal (noncardiac) - Lexiscan myoview normal, EF 80% 2. Leukocytosis 3. Trace hemoglobin on UA  Secondary Discharge Diagnoses:  1. H/o CAD - a. DES to LAD, diagonal 10/2006. b. DES to RCA 05/2007. c. Last cath 11/2012: patent stents, stable dz.  2. History of recurrent UTI 3. HLD 4. Diverticulosis of colon 5. Hx of adenomatous colon polyps 6. Sleep apnea  Hospital Course: [name] is a 72 y/o F with history of CAD s/p drug eluting stents LAD and Diagonal May 2008, followed by DES RCA in December 2008, as well as HTN, hyperlipidemia, and obstructive sleep apnea. Last cath was in in June 2013 showing stable disease and patent stents. She recently saw Dr. Clifton James last week. Complaints at that time included aches and pains which were attributed to statins. She presented to [facility] 12/27/2012 with shoulder pain following a fall. She fell on a deck last week. When she went to bed the night prior to admission, while drifting off to sleep she became aware of severe left shoulder pain. It moved into her left chest anteriorly and posteriorly. It did not radiate into her neck. It persisted for about 18 hours in total. ECG was reported as normal and troponins were negative. She did have elevated WBC 15k that trended down to 13k. She was afebrile. She was not tachycardic or tachypnic. Due to her fall, she underwent CT abd/pelvis and thoracic spine films without acute findings. The left arm pain resolved spontaneously and she remained completely pain free this morning. Lexiscan cardiolite was performed today to evaluate for cardiac etiology and was normal. There was no evidence of ischemia or infarct, EF 80%, no RWMA. Dr. Antoine Poche has seen and examined her today and feels  she is stable for discharge. The patient ambulated without complication. She was instructed to f/u PCP to make sure leukocytosis goes back to normal given no finding of infection this admission, as well as to re-evaluate UA given small Hgb found here. She verbalized understanding.   Discharge Vitals: Blood pressure 141/65, pulse 94, temperature 98.3 F (36.8 C), temperature source Oral, resp. rate 18, height 5\' 3"  (1.6 m), weight 170 lb 8 oz (77.338 kg), SpO2 94.00% on RA.  Labs: Lab Results  Component Value Date   WBC 13.2* 12/27/2012   HGB 13.1 12/27/2012   HCT 40.2 12/27/2012   MCV 93.3 12/27/2012   PLT 294 12/27/2012     Recent Labs Lab 12/27/12 1048 12/27/12 1708  NA 138  --   K 3.9  --   CL 102  --   CO2 25  --   BUN 17  --   CREATININE 0.80 0.82  CALCIUM 9.5  --   PROT 7.3  --   BILITOT 0.3  --   ALKPHOS 63  --   ALT 22  --   AST 23  --   GLUCOSE 96  --     Recent Labs  12/27/12 1048 12/27/12 1708 12/27/12 2240 12/28/12 0409  TROPONINI <0.30 <0.30 <0.30 <0.30   Lab Results  Component Value Date   CHOL 159 12/16/2012   HDL 56.00 12/16/2012   LDLCALC 77 12/16/2012   TRIG 132.0 12/16/2012    Diagnostic Studies/Procedures   Dg Chest 2 View  12/27/2012   *RADIOLOGY REPORT*  Clinical Data: Shortness of breath and back pain.  CHEST - 2 VIEW  Comparison: 05/25/2007  Findings: There are very low lung lies bilaterally with bibasilar atelectasis present.  No edema, infiltrate or pleural effusion is seen.  The heart size and mediastinal contours are within normal limits.  IMPRESSION: Low lung volumes with bibasilar atelectasis.   Original Report Authenticated By: Irish Lack, M.D.   Dg Thoracic Spine 2 View  12/27/2012   *RADIOLOGY REPORT*  Clinical Data: Fall with back pain.  THORACIC SPINE - 2 VIEW  Comparison: None.  Findings: No acute fracture or subluxation is identified. Osteophytes present in the mid to lower thoracic spine.  No bony lesions identified.  IMPRESSION: No  acute fracture identified.  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   Original Report Authenticated By: Irish Lack, M.D.   Ct Abdomen Pelvis W Contrast 12/27/2012   *RADIOLOGY REPORT*  Clinical Data: Fall with left flank pain.  CT ABDOMEN AND PELVIS WITH CONTRAST  Technique:  Multidetector CT imaging of the abdomen and pelvis was performed following the standard protocol during bolus administration of intravenous contrast.  Contrast: OMNIPAQUE IOHEXOL 300 MG/ML  SOLN  Comparison: None.  Findings: Visualized lung bases show scarring and atelectasis at both bases.  No renal injuries are identified with a normal appearance to both kidneys.  No abnormal fluid collections or hematoma identified. No evidence of bony fractures.  The rest of the abdomen and pelvis is unremarkable including the liver, gallbladder, pancreas, spleen, adrenal glands and bowel.  No hernias, masses or enlarged lymph nodes are seen.  The bladder is unremarkable.  Atherosclerotic calcifications of the distal abdominal aorta noted without aneurysm.  IMPRESSION: No acute findings.   Original Report Authenticated By: Irish Lack, M.D.   Nm Myocar Multi W/spect W/wall Motion / Ef 12/28/2012   Lexiscan Myovue:  Indication : Chest Pain  The patient received .4 mg of Lexiscan as a bous.  Resting ECG was normal and did not change with infusion. Maximum HR 102 BP stable at 153/74 mmHg No significant symptoms  The resting and stress images were reconstructed in the vertical horizontal and short axis planes.  Resting and stress images were normal  RAW images with motion artifact  QPS 4 scattered  Surface images normal with no RWMA;s EF 80%  Impression:  Normal lexiscan myovue with no ischemia or infarct EF 80%  Charlton Haws MD [facility]   Original Report Authenticated By: Charlton Haws, M.D.   Mm Digital Screening 12/16/2012   *RADIOLOGY REPORT*  Clinical Data: Screening.  DIGITAL SCREENING BILATERAL MAMMOGRAM WITH  CAD  Comparison:  Previous exams.  FINDINGS:  ACR Breast Density Category b:  There are scattered areas of fibroglandular density.  There are no findings suspicious for malignancy.  Images were processed with CAD.  IMPRESSION: No mammographic evidence of malignancy.  A result letter of this screening mammogram will be mailed directly to the patient.  RECOMMENDATION: Screening mammogram in one year. (Code:SM-B-01Y)  BI-RADS CATEGORY 1:  Negative.   Original Report Authenticated By: Esperanza Heir, M.D.    Discharge Medications     Medication List         amLODipine 2.5 MG tablet  Commonly known as:  NORVASC  Take 2.5 mg by mouth daily.     aspirin 81 MG tablet  Take 1 tablet (81 mg total) by mouth daily.     calcium carbonate 600 MG Tabs  Commonly known as:  OS-CAL  Take 600 mg by mouth 2 (two) times daily with a meal.     cholecalciferol 1000 UNITS tablet  Commonly known as:  VITAMIN D  Take 1,000 Units by mouth daily.     clopidogrel 75 MG tablet  Commonly known as:  PLAVIX  Take 75 mg by mouth daily.     cycloSPORINE 0.05 % ophthalmic emulsion  Commonly known as:  RESTASIS  Place 1 drop into both eyes daily as needed (for dry eyes).     fish oil-omega-3 fatty acids 1000 MG capsule  Take 1 g by mouth 2 (two) times daily.     gabapentin 100 MG capsule  Commonly known as:  NEURONTIN  Take 100 mg by mouth 3 (three) times daily.     METAMUCIL 30.9 % Powd  Generic drug:  Psyllium  Take by mouth 1 dose over 24 hours.     multivitamin with minerals Tabs  Take 1 tablet by mouth daily.     nitroGLYCERIN 0.4 MG SL tablet  Commonly known as:  NITROSTAT  Place 0.4 mg under the tongue every 5 (five) minutes as needed for chest pain.     PROBIOTIC DAILY PO  Take 1 capsule by mouth daily.     rosuvastatin 40 MG tablet  Commonly known as:  CRESTOR  Take 40 mg by mouth every other day.     vitamin B-12 1000 MCG tablet  Commonly known as:  CYANOCOBALAMIN  Take 1,000 mcg by  mouth daily.     vitamin C 500 MG tablet  Commonly known as:  ASCORBIC ACID  Take 500 mg by mouth daily.        Disposition   The patient will be discharged in stable condition to home. Discharge Orders   Future Orders Complete By Expires     Diet - low sodium heart healthy  As directed     Increase activity slowly  As directed       Follow-up Information   Follow up with Gwen Pounds, MD. (Please follow up with Dr. Timothy Lasso to make sure your white blood cell count goes back to normal, and to recheck your urine for microscopic blood.)    Contact information:   [address] [facility] [place] [address] [phone]       Follow up with Verne Carrow, MD. (Follow up with Dr. Clifton James as previously directed (call us sooner if you need [place]!))    Contact information:   [address] [place] [address] [phone]         Duration of Discharge Encounter: Greater than 30 minutes including physician and PA time.  Signed, Ronie Spies PA-C 12/28/2012, 3:10 PM  Patient seen and examined.  Plan as discussed in my rounding note for today and outlined above. Fayrene Fearing [facility]  12/28/2012  3:15 PM

## 2012-12-28 NOTE — Progress Notes (Signed)
SUBJECTIVE:  No pain.  No SOB   PHYSICAL EXAM Filed Vitals:   12/27/12 1539 12/27/12 1640 12/27/12 2121 12/28/12 0419  BP: 115/61 135/61 133/68 134/67  Pulse: 70 68 65 64  Temp: 98.2 F (36.8 C) 98.2 F (36.8 C) 98 F (36.7 C) 98 F (36.7 C)  TempSrc: Oral Oral Oral Oral  Resp: 13 12 14 16   Height:  5\' 3"  (1.6 m)    Weight:  170 lb 8 oz (77.338 kg)    SpO2: 90% 95% 98% 96%   General:  No distress Lungs:  Clear Heart:  RRR, no rub, no murmur Abdomen:  Positive bowel sounds, no rebound no guarding Extremities:  No edema  LABS: Lab Results  Component Value Date   TROPONINI <0.30 12/28/2012   Results for orders placed during the hospital encounter of 12/27/12 (from the past 24 hour(s))  CBC WITH DIFFERENTIAL     Status: Abnormal   Collection Time    12/27/12 10:48 AM      Result Value Range   WBC 15.4 (*) 4.0 - 10.5 K/uL   RBC 4.48  3.87 - 5.11 MIL/uL   Hemoglobin 13.7  12.0 - 15.0 g/dL   HCT 16.1  09.6 - 04.5 %   MCV 92.2  78.0 - 100.0 fL   MCH 30.6  26.0 - 34.0 pg   MCHC 33.2  30.0 - 36.0 g/dL   RDW 40.9  81.1 - 91.4 %   Platelets 303  150 - 400 K/uL   Neutrophils Relative % 69  43 - 77 %   Neutro Abs 10.6 (*) 1.7 - 7.7 K/uL   Lymphocytes Relative 22  12 - 46 %   Lymphs Abs 3.4  0.7 - 4.0 K/uL   Monocytes Relative 8  3 - 12 %   Monocytes Absolute 1.2 (*) 0.1 - 1.0 K/uL   Eosinophils Relative 1  0 - 5 %   Eosinophils Absolute 0.1  0.0 - 0.7 K/uL   Basophils Relative 1  0 - 1 %   Basophils Absolute 0.1  0.0 - 0.1 K/uL  COMPREHENSIVE METABOLIC PANEL     Status: Abnormal   Collection Time    12/27/12 10:48 AM      Result Value Range   Sodium 138  135 - 145 mEq/L   Potassium 3.9  3.5 - 5.1 mEq/L   Chloride 102  96 - 112 mEq/L   CO2 25  19 - 32 mEq/L   Glucose, Bld 96  70 - 99 mg/dL   BUN 17  6 - 23 mg/dL   Creatinine, Ser 7.82  0.50 - 1.10 mg/dL   Calcium 9.5  8.4 - 95.6 mg/dL   Total Protein 7.3  6.0 - 8.3 g/dL   Albumin 3.8  3.5 - 5.2 g/dL   AST 23  0  - 37 U/L   ALT 22  0 - 35 U/L   Alkaline Phosphatase 63  39 - 117 U/L   Total Bilirubin 0.3  0.3 - 1.2 mg/dL   GFR calc non Af Amer 72 (*) >90 mL/min   GFR calc Af Amer 83 (*) >90 mL/min  LIPASE, BLOOD     Status: None   Collection Time    12/27/12 10:48 AM      Result Value Range   Lipase 49  11 - 59 U/L  TROPONIN I     Status: None   Collection Time    12/27/12 10:48 AM  Result Value Range   Troponin I <0.30  <0.30 ng/mL  URINALYSIS, ROUTINE W REFLEX MICROSCOPIC     Status: Abnormal   Collection Time    12/27/12 11:49 AM      Result Value Range   Color, Urine YELLOW  YELLOW   APPearance CLEAR  CLEAR   Specific Gravity, Urine 1.013  1.005 - 1.030   pH 6.5  5.0 - 8.0   Glucose, UA NEGATIVE  NEGATIVE mg/dL   Hgb urine dipstick SMALL (*) NEGATIVE   Bilirubin Urine NEGATIVE  NEGATIVE   Ketones, ur NEGATIVE  NEGATIVE mg/dL   Protein, ur NEGATIVE  NEGATIVE mg/dL   Urobilinogen, UA 0.2  0.0 - 1.0 mg/dL   Nitrite NEGATIVE  NEGATIVE   Leukocytes, UA NEGATIVE  NEGATIVE  URINE MICROSCOPIC-ADD ON     Status: None   Collection Time    12/27/12 11:49 AM      Result Value Range   Squamous Epithelial / LPF RARE  RARE   WBC, UA 0-2  <3 WBC/hpf   RBC / HPF 0-2  <3 RBC/hpf  TROPONIN I     Status: None   Collection Time    12/27/12  5:08 PM      Result Value Range   Troponin I <0.30  <0.30 ng/mL  CBC     Status: Abnormal   Collection Time    12/27/12  5:08 PM      Result Value Range   WBC 13.2 (*) 4.0 - 10.5 K/uL   RBC 4.31  3.87 - 5.11 MIL/uL   Hemoglobin 13.1  12.0 - 15.0 g/dL   HCT 11.9  14.7 - 82.9 %   MCV 93.3  78.0 - 100.0 fL   MCH 30.4  26.0 - 34.0 pg   MCHC 32.6  30.0 - 36.0 g/dL   RDW 56.2  13.0 - 86.5 %   Platelets 294  150 - 400 K/uL  CREATININE, SERUM     Status: Abnormal   Collection Time    12/27/12  5:08 PM      Result Value Range   Creatinine, Ser 0.82  0.50 - 1.10 mg/dL   GFR calc non Af Amer 70 (*) >90 mL/min   GFR calc Af Amer 81 (*) >90 mL/min    TROPONIN I     Status: None   Collection Time    12/27/12 10:40 PM      Result Value Range   Troponin I <0.30  <0.30 ng/mL  TROPONIN I     Status: None   Collection Time    12/28/12  4:09 AM      Result Value Range   Troponin I <0.30  <0.30 ng/mL    Intake/Output Summary (Last 24 hours) at 12/28/12 7846 Last data filed at 12/28/12 0600  Gross per 24 hour  Intake   1422 ml  Output   1300 ml  Net    122 ml    ASSESSMENT AND PLAN:  Shoulder pain:   Cardiac enzymes negative.  Plan Lexiscan Myoview today. If negative no further cardiac work up.   Rollene Rotunda 12/28/2012 7:22 AM

## 2012-12-28 NOTE — Progress Notes (Signed)
Pt denies any nausea - refusing scheduled q6 hour zofran.

## 2012-12-29 DIAGNOSIS — M549 Dorsalgia, unspecified: Secondary | ICD-10-CM | POA: Insufficient documentation

## 2012-12-29 DIAGNOSIS — R319 Hematuria, unspecified: Secondary | ICD-10-CM | POA: Insufficient documentation

## 2012-12-29 DIAGNOSIS — K5900 Constipation, unspecified: Secondary | ICD-10-CM | POA: Insufficient documentation

## 2013-04-23 ENCOUNTER — Telehealth: Payer: Self-pay | Admitting: Cardiovascular Disease

## 2013-04-23 NOTE — Telephone Encounter (Signed)
New Problem:  Pt is asking if she needs labs for her January appt... There are no orders in Epic. Please advise

## 2013-04-23 NOTE — Telephone Encounter (Signed)
Lipid and liver profiles were checked in July 2014 and were OK.  Does not need lab work prior to upcoming appt with Dr. Clifton James. I spoke with pt and gave her this information.  She had recent labs done at Dr. Ferd Hibbs office and will bring these to her appt.

## 2013-05-03 ENCOUNTER — Other Ambulatory Visit: Payer: Self-pay | Admitting: Cardiovascular Disease

## 2013-05-31 ENCOUNTER — Other Ambulatory Visit: Payer: Self-pay

## 2013-05-31 MED ORDER — AMLODIPINE BESYLATE 2.5 MG PO TABS: 2.5000 mg | ORAL_TABLET | Freq: Every day | ORAL | Status: DC

## 2013-05-31 MED ORDER — CLOPIDOGREL BISULFATE 75 MG PO TABS: 75.0000 mg | ORAL_TABLET | Freq: Every day | ORAL | Status: DC

## 2013-05-31 MED ORDER — ROSUVASTATIN CALCIUM 40 MG PO TABS: 40.0000 mg | ORAL_TABLET | ORAL | Status: DC

## 2013-06-23 ENCOUNTER — Encounter: Payer: Self-pay | Admitting: Cardiovascular Disease

## 2013-06-23 ENCOUNTER — Ambulatory Visit (INDEPENDENT_AMBULATORY_CARE_PROVIDER_SITE_OTHER): Payer: Medicare Other | Admitting: Cardiovascular Disease

## 2013-06-23 ENCOUNTER — Encounter (INDEPENDENT_AMBULATORY_CARE_PROVIDER_SITE_OTHER): Payer: Self-pay

## 2013-06-23 VITALS — BP 122/74 | HR 73 | Ht 62.5 in | Wt 170.6 lb

## 2013-06-23 DIAGNOSIS — I2510 Atherosclerotic heart disease of native coronary artery without angina pectoris: Secondary | ICD-10-CM

## 2013-06-23 DIAGNOSIS — E785 Hyperlipidemia, unspecified: Secondary | ICD-10-CM

## 2013-06-23 DIAGNOSIS — I10 Essential (primary) hypertension: Secondary | ICD-10-CM

## 2013-06-23 NOTE — Progress Notes (Signed)
History of Present Illness: 72 yo WF with h/o CAD with previous drug eluting stents mid LAD, diagonal and proximal/mid RCA as well as HTN, hyperlipidemia, obstructive sleep apnea here today for cardiac follow up. She has been followed in the past by Dr. Alfonzo Feller with [facility]. I saw her for the first time on 04/22/11. Her cardiac history includes drug eluting stents LAD and Diagonal May 2008, followed by DES RCA in December 2008. Stress test February 2011 in [facility] with no ischemia, normal LVEF at 88%. At her initial visit in our office on 04/22/11, she had c/o left arm pain with walking but not all of the time. Her prior anginal equivalent was left arm pain. There was no associated SOB, palpitations, near syncope or syncope. I arranged a treadmill stress test on 04/24/11. She exercised for 6 minutes and 34 seconds. There were no ischemic EKG changes. She did have tingling in her arm with exercise but no chest pain. I saw her June 2013 and she c/o recurrence of pain in her left arm with exertion associated with fatigue and SOB. Cardiac cath in June 2013 with stable disease, patent stents LAD, Diagonal, RCA. Admitted July 2014 with left shoulder pain. Stress myoview 12/28/12 with no ischemia.   She is here today for follow up. She has had no chest pain or SOB. She does have some aches and pains in her arms and legs on statin. Occasional left shoulder pains. Mostly non-exertional.   Primary Care Physician: Shon Baton  Last Lipid Profile:Lipid Panel (Total chol 169, LDL 89, HDL 53 October 2014 in primary care)     Component Value Date/Time   CHOL 159 12/16/2012 0902   TRIG 132.0 12/16/2012 0902   HDL 56.00 12/16/2012 0902   CHOLHDL 3 12/16/2012 0902   VLDL 26.4 12/16/2012 0902   LDLCALC 77 12/16/2012 0902     Past Medical History  Diagnosis Date  . History of recurrent UTIs   . Hyperlipidemia   . Diverticulosis of colon   . CAD (coronary artery disease)     a. DES to LAD,  diagonal 10/2006. b. DES to RCA 05/2007. c. Last cath 11/2012: patent stents, stable dz. d. Carlton Adam cardiolite negative 12/2012.  Marland Kitchen Hx of adenomatous colonic polyps   . Sleep apnea     Past Surgical History  Procedure Laterality Date  . Angioplasty  5/08 12/08     stent   . Right shoulder surgery    . Cataract left    . Vitrectomy left eye      Current Outpatient Prescriptions  Medication Sig Dispense Refill  . amLODipine (NORVASC) 2.5 MG tablet Take 1 tablet (2.5 mg total) by mouth daily.  30 tablet  6  . aspirin 81 MG tablet Take 1 tablet (81 mg total) by mouth daily.      . calcium carbonate (OS-CAL) 600 MG TABS Take 600 mg by mouth 2 (two) times daily with a meal.      . cholecalciferol (VITAMIN D) 1000 UNITS tablet Take 1,000 Units by mouth daily.      . clopidogrel (PLAVIX) 75 MG tablet Take 1 tablet (75 mg total) by mouth daily.  30 tablet  6  . cycloSPORINE (RESTASIS) 0.05 % ophthalmic emulsion Place 1 drop into both eyes daily as needed (for dry eyes).      . fish oil-omega-3 fatty acids 1000 MG capsule Take 1 g by mouth 2 (two) times daily.      Marland Kitchen  gabapentin (NEURONTIN) 100 MG capsule Take 100 mg by mouth as needed.       . Multiple Vitamin (MULTIVITAMIN WITH MINERALS) TABS Take 1 tablet by mouth daily.      . nitroGLYCERIN (NITROSTAT) 0.4 MG SL tablet Place 0.4 mg under the tongue every 5 (five) minutes as needed for chest pain.      . Probiotic Product (PROBIOTIC DAILY PO) Take 1 capsule by mouth daily.      . Psyllium (METAMUCIL) 30.9 % POWD Take by mouth 1 dose over 24 hours.        . rosuvastatin (CRESTOR) 40 MG tablet Take 1 tablet (40 mg total) by mouth every other day.  30 tablet  6  . vitamin B-12 (CYANOCOBALAMIN) 1000 MCG tablet Take 1,000 mcg by mouth daily.      . vitamin C (ASCORBIC ACID) 500 MG tablet Take 500 mg by mouth daily.       No current facility-administered medications for this visit.    Allergies  Allergen Reactions  . Fluoride Preparations     A  test for eye to make pictures, causing pressure    History   Social History  . Marital Status: Married    Spouse Name: N/A    Number of Children: 0  . Years of Education: N/A   Occupational History  . retired     Social History Main Topics  . Smoking status: Never Smoker   . Smokeless tobacco: Not on file  . Alcohol Use: 1 - 1.5 oz/week    2-3 drink(s) per week  . Drug Use: No  . Sexual Activity: Not on file   Other Topics Concern  . Not on file   Social History Narrative  . No narrative on file    Family History  Problem Relation Age of Onset  . Colon polyps Brother   . Heart disease Brother   . Colon polyps Paternal Grandfather   . Heart disease Father   . Heart disease Brother   . Heart disease Mother   . Heart disease Other     Grandmother   . Cancer Neg Hx     colon    Review of Systems:  As stated in the HPI and otherwise negative.   BP 122/74  Pulse 73  Ht 5' 2.5" (1.588 m)  Wt 170 lb 9.6 oz (77.384 kg)  BMI 30.69 kg/m2  SpO2 99%  Physical Examination: General: Well developed, well nourished, NAD HEENT: OP clear, mucus membranes moist SKIN: warm, dry. No rashes. Neuro: No focal deficits Musculoskeletal: Muscle strength 5/5 all ext Psychiatric: Mood and affect normal Neck: No JVD, no carotid bruits, no thyromegaly, no lymphadenopathy. Lungs:Clear bilaterally, no wheezes, rhonci, crackles Cardiovascular: Regular rate and rhythm. No murmurs, gallops or rubs. Abdomen:Soft. Bowel sounds present. Non-tender.  Extremities: No lower extremity edema. Pulses are 2 + in the bilateral DP/PT.  Cardiac cath 11/27/11:  Left main:  Left Anterior Descending Artery: Large caliber vessel that courses to the apex. There is a patent stent in the proximal to mid LAD with minimal, 20% in stent restenosis. The diagonal branch arises just before the LAD stent. There is a patent stent in the ostium and proximal segment of the diagonal branch with perhaps 20% ostial  stenosis.  Circumflex Artery: Moderate sized vessel with no disease noted. Small early OM branch with no disease.  Right Coronary Artery: Large, dominant vessel with patent stent in the proximal vessel. Just before the stent, there is a  20% plaque. Just beyond the stent, there is a 30% plaque.  Left Ventricular Angiogram: LVEF 65-70%.   Assessment and Plan:   1. CAD: Cardiac cath June 2013 with stable disease. Stress myoview July 2014 with no ischemia. Continue medical management. She has chosen to use ASA 325 mg po Qdaily.   2. HTN: BP is well controlled. No changes today.   3. HYPERLIPIDEMIA: Lipids well controlled on statin. No changes today.

## 2013-06-23 NOTE — Patient Instructions (Signed)
Your physician wants you to follow-up in:  6 months. You will receive a reminder letter in the mail two months in advance. If you don't receive a letter, please call our office to schedule the follow-up appointment.   

## 2013-08-18 ENCOUNTER — Other Ambulatory Visit (HOSPITAL_COMMUNITY): Payer: Self-pay | Admitting: Internal Medicine

## 2013-08-18 ENCOUNTER — Other Ambulatory Visit: Payer: Self-pay | Admitting: Internal Medicine

## 2013-08-18 ENCOUNTER — Ambulatory Visit (HOSPITAL_COMMUNITY)
Admission: RE | Admit: 2013-08-18 | Discharge: 2013-08-18 | Disposition: A | Discharge status: Home / Self Care | Payer: Medicare Other | Source: Ambulatory Visit | Attending: Vascular Surgery | Admitting: Vascular Surgery

## 2013-08-18 DIAGNOSIS — R51 Headache: Secondary | ICD-10-CM | POA: Insufficient documentation

## 2013-08-18 DIAGNOSIS — R42 Dizziness and giddiness: Secondary | ICD-10-CM | POA: Insufficient documentation

## 2013-08-18 DIAGNOSIS — R519 Headache, unspecified: Secondary | ICD-10-CM | POA: Insufficient documentation

## 2013-08-20 ENCOUNTER — Encounter: Payer: Self-pay | Admitting: Vascular Surgery

## 2013-08-20 ENCOUNTER — Ambulatory Visit (INDEPENDENT_AMBULATORY_CARE_PROVIDER_SITE_OTHER): Payer: Medicare Other | Admitting: Vascular Surgery

## 2013-08-20 VITALS — BP 110/63 | HR 74 | Ht 62.5 in | Wt 172.3 lb

## 2013-08-20 DIAGNOSIS — I6529 Occlusion and stenosis of unspecified carotid artery: Secondary | ICD-10-CM | POA: Insufficient documentation

## 2013-08-20 NOTE — Progress Notes (Signed)
New Carotid Patient  Referred by:  Precious Reel, MD [facility] [place], [place] [address]  Reason for referral: Right carotid stenosis  History of Present Illness  [name] is a 73 y.o. (1941-03-22) female who presents with chief complaint: dizziness.  Previous carotid studies demonstrated: RICA 96-78% stenosis, LICA <93% stenosis.  Patient has no history of TIA or stroke symptom.  The patient has never had amaurosis fugax or monocular blindness.  The patient has never had facial drooping or hemiplegia.  The patient has never had receptive or expressive aphasia.   The patient is having intermittent vertigo with a tonic leftward tendency.  The patient's risks factors for carotid disease include: hyperlipidemia.  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  Past Medical History  Diagnosis Date  . History of recurrent UTIs   . Hyperlipidemia   . Diverticulosis of colon   . CAD (coronary artery disease)     a. DES to LAD, diagonal 10/2006. b. DES to RCA 05/2007. c. Last cath 11/2012: patent stents, stable dz. d. Carlton Adam cardiolite negative 12/2012.  Marland Kitchen Hx of adenomatous colonic polyps   . Sleep apnea     Past Surgical History  Procedure Laterality Date  . Angioplasty  5/08 12/08     stent   . Right shoulder surgery    . Cataract left    . Vitrectomy left eye      History   Social History  . Marital Status: Married    Spouse Name: N/A    Number of Children: 0  . Years of Education: N/A   Occupational History  . retired     Social History Main Topics  . Smoking status: Never Smoker   . Smokeless tobacco: Not on file  . Alcohol Use: 1 - 1.5 oz/week    2-3 drink(s) per week  . Drug Use: No  . Sexual Activity: Not on file   Other Topics Concern  . Not on file   Social History Narrative  . No narrative on  file    Family History  Problem Relation Age of Onset  . Colon polyps Brother   . Heart disease Brother   . Cancer Brother   . Hyperlipidemia Brother   . Heart attack Brother   . Colon polyps Paternal Grandfather   . Heart disease Father   . Deep vein thrombosis Father   . Hyperlipidemia Father   . Heart attack Father   . Heart disease Brother   . Stroke Mother   . Heart disease Other     Grandmother    Current Outpatient Prescriptions on File Prior to Visit  Medication Sig Dispense Refill  . amLODipine (NORVASC) 2.5 MG tablet Take 1 tablet (2.5 mg total) by mouth daily.  30 tablet  6  . aspirin 81 MG tablet Take 1 tablet (81 mg total) by mouth daily.      . calcium carbonate (OS-CAL) 600 MG TABS Take 600 mg by mouth 2 (two) times daily with a meal.      . cholecalciferol (VITAMIN D) 1000 UNITS tablet Take 1,000 Units by mouth daily.      . clopidogrel (PLAVIX) 75 MG tablet Take 1 tablet (75 mg total) by mouth daily.  30 tablet  6  . cycloSPORINE (RESTASIS) 0.05 % ophthalmic emulsion Place 1 drop  into both eyes daily as needed (for dry eyes).      . fish oil-omega-3 fatty acids 1000 MG capsule Take 1 g by mouth 2 (two) times daily.      Marland Kitchen gabapentin (NEURONTIN) 100 MG capsule Take 100 mg by mouth as needed.       . Multiple Vitamin (MULTIVITAMIN WITH MINERALS) TABS Take 1 tablet by mouth daily.      . nitroGLYCERIN (NITROSTAT) 0.4 MG SL tablet Place 0.4 mg under the tongue every 5 (five) minutes as needed for chest pain.      . Probiotic Product (PROBIOTIC DAILY PO) Take 1 capsule by mouth daily.      . Psyllium (METAMUCIL) 30.9 % POWD Take by mouth 1 dose over 24 hours.        . rosuvastatin (CRESTOR) 40 MG tablet Take 1 tablet (40 mg total) by mouth every other day.  30 tablet  6  . vitamin B-12 (CYANOCOBALAMIN) 1000 MCG tablet Take 1,000 mcg by mouth daily.      . vitamin C (ASCORBIC ACID) 500 MG tablet Take 500 mg by mouth daily.       No current facility-administered  medications on file prior to visit.    Allergies  Allergen Reactions  . Fluoride Preparations     A test for eye to make pictures, causing pressure    REVIEW OF SYSTEMS:  (Positives checked otherwise negative)  CARDIOVASCULAR:  []  chest pain, []  chest pressure, []  palpitations, []  shortness of breath when laying flat, []  shortness of breath with exertion,  []  pain in feet when walking, []  pain in feet when laying flat, []  history of blood clot in veins (DVT), []  history of phlebitis, []  swelling in legs, []  varicose veins  PULMONARY:  []  productive cough, []  asthma, []  wheezing  NEUROLOGIC:  []  weakness in arms or legs, []  numbness in arms or legs, []  difficulty speaking or slurred speech, [x]  loss of vision in one eye, [x]  dizziness  HEMATOLOGIC:  []  bleeding problems, []  problems with blood clotting too easily  MUSCULOSKEL:  []  joint pain, []  joint swelling  GASTROINTEST:  []  vomiting blood, []  blood in stool     GENITOURINARY:  []  burning with urination, []  blood in urine  PSYCHIATRIC:  []  history of major depression  INTEGUMENTARY:  []  rashes, []  ulcers  CONSTITUTIONAL:  []  fever, []  chills  For VQI Use Only  PRE-ADM LIVING: Home  AMB STATUS: Ambulatory  CAD Sx: None  PRIOR CHF: None  STRESS TEST: [x]  No, [ ]  Normal, [ ]  + ischemia, [ ]  + MI, [ ]  Both  Physical Examination  Filed Vitals:   08/20/13 0923 08/20/13 0932  BP: 130/54 110/63  Pulse: 74   Height: 5' 2.5" (1.588 m)   Weight: 172 lb 4.8 oz (78.155 kg)   SpO2: 98%    Body mass index is 30.99 kg/(m^2).  General: A&O x 3, WD, mildly obese  Head: [place]/AT  Ear/Nose/Throat: Hearing grossly intact, nares w/o erythema or drainage, oropharynx w/o Erythema/Exudate, Mallampati score: 3  Eyes: PERRLA, EOMI, Post surg chg to L pupil  Neck: Supple, no nuchal rigidity, no palpable LAD  Pulmonary: Sym exp, good air movt, CTAB, no rales, rhonchi, & wheezing  Cardiac: RRR, Nl S1, S2, no Murmurs, rubs or  gallops  Vascular: Vessel Right Left  Radial Palpable Palpable  Brachial Palpable Palpable  Carotid Palpable, without bruit Palpable, without bruit  Aorta  Not palpable N/A  Femoral Palpable Palpable  Popliteal Not palpable Not palpable  PT Not Palpable Not Palpable  DP Faintly Palpable Faintly Palpable   Gastrointestinal: soft, NTND, -G/R, - HSM, - masses, - CVAT B  Musculoskeletal: M/S 5/5 throughout , Extremities without ischemic changes   Neurologic: CN 2-12 intact , Pain and light touch intact in extremities , Motor exam as listed above  Psychiatric: Judgment intact, Mood & affect appropriate for pt's clinical situation  Dermatologic: See M/S exam for extremity exam, no rashes otherwise noted  Lymph : No Cervical, Axillary, or Inguinal lymphadenopathy   Non-Invasive Vascular Imaging  CAROTID DUPLEX (Date: 08/18/13):   R ICA stenosis: 60-79%  R VA: patent and antegrade  L ICA stenosis: <50%  L VA: patent and antegrade  Irregular L carotid plaque  Medical Decision Making  [name] is a 73 y.o. female who presents with: asx R ICA stenosis 60-79%, asx L ICA stenosis <50%   I doubt this patient's sx are related to her R ICA stenosis. Her sx are more consistent with benign positional vertigo, but that would need to be diagnosed by Neurology.  In regards to the ultrasound report, I think the reading was poorly worded, as there are no definitive findings consistent with ulceration in the right carotid plaque.  Based on the patient's vascular studies and examination, I have offered the patient: q6 month B carotid duplex.  In generally, the threshold for intervention on asx carotid disease is >80%.  This has been supported by more recently studies including CREST.   I discussed in depth with the patient the nature of atherosclerosis, and emphasized the importance of maximal medical management including strict control of blood pressure, blood glucose, and lipid  levels, obtaining regular exercise, antiplatelet agents, and cessation of smoking.   The patient is currently on a statin: Crestor.  The patient is currently on an anti-platelet: ASA.  The patient is aware that without maximal medical management the underlying atherosclerotic disease process will progress, limiting the benefit of any interventions.  Thank you for allowing [place] to participate in this patient's care.  Adele Barthel, MD [facility] Office: [phone] Pager: [phone]  08/20/2013, 10:15 AM

## 2013-08-23 NOTE — Addendum Note (Signed)
Addended by: Reola Calkins on: 08/23/2013 02:32 PM   Modules accepted: Orders

## 2013-08-25 ENCOUNTER — Ambulatory Visit
Admission: RE | Admit: 2013-08-25 | Discharge: 2013-08-25 | Disposition: A | Discharge status: Home / Self Care | Payer: Medicare Other | Source: Ambulatory Visit | Attending: Internal Medicine | Admitting: Internal Medicine

## 2013-08-25 DIAGNOSIS — R51 Headache: Secondary | ICD-10-CM

## 2013-11-24 ENCOUNTER — Other Ambulatory Visit: Payer: Self-pay | Admitting: Cardiovascular Disease

## 2013-12-14 ENCOUNTER — Other Ambulatory Visit: Payer: Self-pay

## 2013-12-14 DIAGNOSIS — Z1231 Encounter for screening mammogram for malignant neoplasm of breast: Secondary | ICD-10-CM

## 2013-12-22 ENCOUNTER — Ambulatory Visit (INDEPENDENT_AMBULATORY_CARE_PROVIDER_SITE_OTHER): Payer: Medicare Other | Admitting: Cardiovascular Disease

## 2013-12-22 ENCOUNTER — Encounter: Payer: Self-pay | Admitting: Cardiovascular Disease

## 2013-12-22 VITALS — BP 110/68 | HR 73 | Ht 62.0 in | Wt 170.0 lb

## 2013-12-22 DIAGNOSIS — I739 Peripheral vascular disease, unspecified: Secondary | ICD-10-CM

## 2013-12-22 DIAGNOSIS — I2510 Atherosclerotic heart disease of native coronary artery without angina pectoris: Secondary | ICD-10-CM

## 2013-12-22 DIAGNOSIS — E785 Hyperlipidemia, unspecified: Secondary | ICD-10-CM

## 2013-12-22 DIAGNOSIS — I10 Essential (primary) hypertension: Secondary | ICD-10-CM

## 2013-12-22 DIAGNOSIS — I779 Disorder of arteries and arterioles, unspecified: Secondary | ICD-10-CM

## 2013-12-22 NOTE — Patient Instructions (Signed)
Your physician wants you to follow-up in:  6 months. You will receive a reminder letter in the mail two months in advance. If you don't receive a letter, please call our office to schedule the follow-up appointment.   

## 2013-12-22 NOTE — Progress Notes (Signed)
History of Present Illness: 73 yo WF with h/o CAD with previous drug eluting stents mid LAD, diagonal and proximal/mid RCA as well as HTN, hyperlipidemia, obstructive sleep apnea, carotid artery disease.  here today for cardiac follow up. She has been followed in the past by Dr. Alfonzo Feller with [facility]. I saw her for the first time on 04/22/11. Her cardiac history includes drug eluting stents LAD and Diagonal May 2008, followed by DES RCA in December 2008. Stress test February 2011 in [facility] with no ischemia, normal LVEF at 88%. At her initial visit in our office on 04/22/11, she had c/o left arm pain with walking but not all of the time. Her prior anginal equivalent was left arm pain. There was no associated SOB, palpitations, near syncope or syncope. I arranged a treadmill stress test on 04/24/11. She exercised for 6 minutes and 34 seconds. There were no ischemic EKG changes. She did have tingling in her arm with exercise but no chest pain. I saw her June 2013 and she c/o recurrence of pain in her left arm with exertion associated with fatigue and SOB. Cardiac cath in June 2013 with stable disease, patent stents LAD, Diagonal, RCA. Admitted July 2014 with left shoulder pain. Stress myoview 12/28/12 with no ischemia. She had some left sided weakness in March 2015 and was seen in [facility]. Found to have 60-79% RICA stenosis, <95% LICA stenosis.   She is here today for follow up. She has had no chest pain or SOB. She has been walking 1.5 to 2 miles per day. She has been working in the yard. No LE edema or orthopnea.   Primary Care Physician: Shon Baton  Last Lipid Profile: Followed in primary care   Past Medical History  Diagnosis Date  . History of recurrent UTIs   . Hyperlipidemia   . Diverticulosis of colon   . CAD (coronary artery disease)     a. DES to LAD, diagonal 10/2006. b. DES to RCA 05/2007. c. Last cath 11/2012: patent stents, stable dz. d. Carlton Adam cardiolite negative  12/2012.  Marland Kitchen Hx of adenomatous colonic polyps   . Sleep apnea     Past Surgical History  Procedure Laterality Date  . Angioplasty  5/08 12/08     stent   . Right shoulder surgery    . Cataract left    . Vitrectomy left eye      Current Outpatient Prescriptions  Medication Sig Dispense Refill  . amLODipine (NORVASC) 2.5 MG tablet Take 1 tablet (2.5 mg total) by mouth daily.  30 tablet  6  . aspirin 81 MG tablet Take 1 tablet (81 mg total) by mouth daily.      . calcium carbonate (OS-CAL) 600 MG TABS Take 600 mg by mouth 2 (two) times daily with a meal.      . cholecalciferol (VITAMIN D) 1000 UNITS tablet Take 1,000 Units by mouth daily.      . clopidogrel (PLAVIX) 75 MG tablet TAKE 1 TABLET BY MOUTH EVERY DAY  90 tablet  0  . cycloSPORINE (RESTASIS) 0.05 % ophthalmic emulsion Place 1 drop into both eyes daily as needed (for dry eyes).      . fish oil-omega-3 fatty acids 1000 MG capsule Take 1 g by mouth 2 (two) times daily.      Marland Kitchen gabapentin (NEURONTIN) 100 MG capsule Take 100 mg by mouth as needed.       . Multiple Vitamin (MULTIVITAMIN WITH MINERALS) TABS Take 1  tablet by mouth daily.      . nitroGLYCERIN (NITROSTAT) 0.4 MG SL tablet Place 0.4 mg under the tongue every 5 (five) minutes as needed for chest pain.      . Probiotic Product (PROBIOTIC DAILY PO) Take 1 capsule by mouth daily.      . Psyllium (METAMUCIL) 30.9 % POWD Take by mouth 1 dose over 24 hours.        . rosuvastatin (CRESTOR) 40 MG tablet Take 1 tablet (40 mg total) by mouth every other day.  30 tablet  6  . vitamin B-12 (CYANOCOBALAMIN) 1000 MCG tablet Take 1,000 mcg by mouth daily.      . vitamin C (ASCORBIC ACID) 500 MG tablet Take 500 mg by mouth daily.       No current facility-administered medications for this visit.    Allergies  Allergen Reactions  . Fluoride Preparations     A test for eye to make pictures, causing pressure    History   Social History  . Marital Status: Married    Spouse Name: N/A     Number of Children: 0  . Years of Education: N/A   Occupational History  . retired     Social History Main Topics  . Smoking status: Never Smoker   . Smokeless tobacco: Not on file  . Alcohol Use: 1.0 - 1.5 oz/week    2-3 drink(s) per week  . Drug Use: No  . Sexual Activity: Not on file   Other Topics Concern  . Not on file   Social History Narrative  . No narrative on file    Family History  Problem Relation Age of Onset  . Colon polyps Brother   . Heart disease Brother   . Cancer Brother   . Hyperlipidemia Brother   . Heart attack Brother   . Colon polyps Paternal Grandfather   . Heart disease Father   . Deep vein thrombosis Father   . Hyperlipidemia Father   . Heart attack Father   . Heart disease Brother   . Stroke Mother   . Heart disease Other     Grandmother     Review of Systems:  As stated in the HPI and otherwise negative.   BP 110/68  Pulse 73  Ht 5\' 2"  (1.575 m)  Wt 170 lb (77.111 kg)  BMI 31.09 kg/m2  Physical Examination: General: Well developed, well nourished, NAD HEENT: OP clear, mucus membranes moist SKIN: warm, dry. No rashes. Neuro: No focal deficits Musculoskeletal: Muscle strength 5/5 all ext Psychiatric: Mood and affect normal Neck: No JVD, no carotid bruits, no thyromegaly, no lymphadenopathy. Lungs:Clear bilaterally, no wheezes, rhonci, crackles Cardiovascular: Regular rate and rhythm. No murmurs, gallops or rubs. Abdomen:Soft. Bowel sounds present. Non-tender.  Extremities: No lower extremity edema. Pulses are 2 + in the bilateral DP/PT.  Cardiac cath 11/27/11:  Left main:  Left Anterior Descending Artery: Large caliber vessel that courses to the apex. There is a patent stent in the proximal to mid LAD with minimal, 20% in stent restenosis. The diagonal branch arises just before the LAD stent. There is a patent stent in the ostium and proximal segment of the diagonal branch with perhaps 20% ostial stenosis.  Circumflex  Artery: Moderate sized vessel with no disease noted. Small early OM branch with no disease.  Right Coronary Artery: Large, dominant vessel with patent stent in the proximal vessel. Just before the stent, there is a 20% plaque. Just beyond the stent, there is a  30% plaque.  Left Ventricular Angiogram: LVEF 65-70%.   Assessment and Plan:   1. CAD: Cardiac cath June 2013 with stable disease. Stress myoview July 2014 with no ischemia. Continue medical management with ASA, Plavix and statin.    2. HTN: BP is well controlled. No changes today.   3. HYPERLIPIDEMIA: Lipids well controlled on statin. No changes today.   4. Carotid artery disease: Followed in [facility].

## 2013-12-24 ENCOUNTER — Ambulatory Visit
Admission: RE | Admit: 2013-12-24 | Discharge: 2013-12-24 | Disposition: A | Discharge status: Home / Self Care | Payer: Medicare Other | Source: Ambulatory Visit

## 2013-12-24 DIAGNOSIS — Z1231 Encounter for screening mammogram for malignant neoplasm of breast: Secondary | ICD-10-CM

## 2014-02-01 ENCOUNTER — Other Ambulatory Visit: Payer: Self-pay | Admitting: Cardiovascular Disease

## 2014-02-24 ENCOUNTER — Encounter: Payer: Self-pay | Admitting: Vascular Surgery

## 2014-02-25 ENCOUNTER — Ambulatory Visit (HOSPITAL_COMMUNITY)
Admission: RE | Admit: 2014-02-25 | Discharge: 2014-02-25 | Disposition: A | Discharge status: Home / Self Care | Payer: Medicare Other | Source: Ambulatory Visit | Attending: Vascular Surgery | Admitting: Vascular Surgery

## 2014-02-25 ENCOUNTER — Encounter: Payer: Self-pay | Admitting: Vascular Surgery

## 2014-02-25 ENCOUNTER — Ambulatory Visit (INDEPENDENT_AMBULATORY_CARE_PROVIDER_SITE_OTHER): Payer: Medicare Other | Admitting: Vascular Surgery

## 2014-02-25 VITALS — BP 121/71 | HR 66 | Ht 62.0 in | Wt 169.2 lb

## 2014-02-25 DIAGNOSIS — I6529 Occlusion and stenosis of unspecified carotid artery: Secondary | ICD-10-CM | POA: Diagnosis present

## 2014-02-25 NOTE — Addendum Note (Signed)
Addended by: Mena Goes on: 02/25/2014 04:28 PM   Modules accepted: Orders

## 2014-02-25 NOTE — Progress Notes (Signed)
    Established Carotid Patient  History of Present Illness  [name] is a 73 y.o. (1940/09/26) female who presents with chief complaint: routine surveillance.  Previous carotid studies demonstrated: RICA 78-67% stenosis, LICA <67% stenosis.  Patient has no history of TIA or stroke symptom.  The patient has never had amaurosis fugax or monocular blindness.  The patient has never had facial drooping or hemiplegia.  The patient has never had receptive or expressive aphasia.    The patient's PMH, PSH, SH, FamHx, Med, and Allergies are unchanged from 08/20/13.  On ROS today: no TIA or CVA, no intermittent claudication or rest pain  Physical Examination  Filed Vitals:   02/25/14 1034 02/25/14 1041  BP: 140/73 121/71  Pulse: 66   Height: 5\' 2"  (1.575 m)   Weight: 169 lb 3.2 oz (76.749 kg)   SpO2: 100%    Body mass index is 30.94 kg/(m^2).  General: A&O x 3, WDWN  Eyes: PERRLA, EOMI  Neck: Supple, no nuchal rigidity, no palpable LAD  Pulmonary: Sym exp, good air movt, CTAB, no rales, rhonchi, & wheezing  Cardiac: RRR, Nl S1, S2, no Murmurs, rubs or gallops  Vascular: Vessel Right Left  Radial Palpable Palpable  Brachial Palpable Palpable  Carotid Palpable, without bruit Palpable, without bruit  Aorta Not palpable N/A  Femoral Palpable Palpable  Popliteal Not palpable Not palpable  PT Not Palpable Not Palpable  DP Faintly Palpable Faintly Palpable   Gastrointestinal: soft, NTND, -G/R, - HSM, - masses, - CVAT B  Musculoskeletal: M/S 5/5 throughout , Extremities without ischemic changes   Neurologic: CN 2-12 intact , Pain and light touch intact in extremities , Motor exam as listed above  Non-Invasive Vascular Imaging  CAROTID DUPLEX (Date: 02/25/2014 ):   R ICA stenosis: 60-79%  R VA: patent and antegrade  L ICA stenosis: <40%  L VA: patent and antegrade  Medical Decision Making  [name] is a 73 y.o. female who presents with: asx R ICA stenosis  60-79%., asx L ICA stenosis <40%   Based on the patient's vascular studies and examination, I have offered the patient: q6 month B carotid duplex.  I discussed in depth with the patient the nature of atherosclerosis, and emphasized the importance of maximal medical management including strict control of blood pressure, blood glucose, and lipid levels, antiplatelet agents, obtaining regular exercise, and cessation of smoking.    The patient is aware that without maximal medical management the underlying atherosclerotic disease process will progress, limiting the benefit of any interventions. The patient is currently on a statin: Crestor. The patient is currently on an anti-platelet: ASA.  Thank you for allowing [place] to participate in this patient's care.  Adele Barthel, MD [facility] Office: [phone] Pager: [phone]  02/25/2014, 12:38 PM

## 2014-03-02 ENCOUNTER — Encounter (INDEPENDENT_AMBULATORY_CARE_PROVIDER_SITE_OTHER): Payer: Self-pay

## 2014-03-03 ENCOUNTER — Other Ambulatory Visit: Payer: Self-pay | Admitting: Cardiovascular Disease

## 2014-04-26 DIAGNOSIS — M899 Disorder of bone, unspecified: Secondary | ICD-10-CM | POA: Insufficient documentation

## 2014-05-23 ENCOUNTER — Other Ambulatory Visit: Payer: Self-pay | Admitting: Cardiovascular Disease

## 2014-06-06 ENCOUNTER — Other Ambulatory Visit: Payer: Self-pay | Admitting: Cardiovascular Disease

## 2014-07-23 ENCOUNTER — Other Ambulatory Visit: Payer: Self-pay | Admitting: Cardiovascular Disease

## 2014-08-21 NOTE — Progress Notes (Signed)
History of Present Illness: 74 yo WF with h/o CAD with previous drug eluting stents mid LAD, diagonal and proximal/mid RCA as well as HTN, hyperlipidemia, obstructive sleep apnea, carotid artery disease here today for cardiac follow up. She has been followed in the past by Dr. Alfonzo Feller with [facility]. I saw her for the first time on 04/22/11. Her cardiac history includes drug eluting stents LAD and Diagonal May 2008, followed by DES RCA in December 2008. Stress test February 2011 in [facility] with no ischemia, normal LVEF at 88%. At her initial visit in our office on 04/22/11, she had c/o left arm pain with walking but not all of the time. Her prior anginal equivalent was left arm pain. There was no associated SOB, palpitations, near syncope or syncope. I arranged a treadmill stress test on 04/24/11. She exercised for 6 minutes and 34 seconds. There were no ischemic EKG changes. She did have tingling in her arm with exercise but no chest pain. I saw her June 2013 and she c/o recurrence of pain in her left arm with exertion associated with fatigue and SOB. Cardiac cath in June 2013 with stable disease, patent stents LAD, Diagonal, RCA. Admitted July 2014 with left shoulder pain. Stress myoview 12/28/12 with no ischemia. She had some left sided weakness in March 2015 and was seen in [facility]. Found to have 60-79% RICA stenosis, <99% LICA stenosis. She has been followed in [facility] by Dr. Bridgett Larsson.   She is here today for follow up. She has had no chest pain or SOB. She has been walking 1.5 to 2 miles per day. No LE edema or orthopnea.   Primary Care Physician: Shon Baton  Last Lipid Profile: Followed in primary care   Past Medical History  Diagnosis Date  . History of recurrent UTIs   . Hyperlipidemia   . Diverticulosis of colon   . CAD (coronary artery disease)     a. DES to LAD, diagonal 10/2006. b. DES to RCA 05/2007. c. Last cath 11/2012: patent stents, stable dz. d. Carlton Adam cardiolite  negative 12/2012.  Marland Kitchen Hx of adenomatous colonic polyps   . Sleep apnea   . Peripheral vascular disease     Past Surgical History  Procedure Laterality Date  . Angioplasty  5/08 12/08     stent   . Right shoulder surgery    . Cataract left    . Vitrectomy left eye      Current Outpatient Prescriptions  Medication Sig Dispense Refill  . amLODipine (NORVASC) 2.5 MG tablet TAKE 1 TABLET BY MOUTH EVERY DAY 90 tablet 0  . aspirin 81 MG tablet Take 1 tablet (81 mg total) by mouth daily.    . calcium citrate (CALCITRATE - DOSED IN MG ELEMENTAL CALCIUM) 950 MG tablet Take 200 mg of elemental calcium by mouth daily.    . cholecalciferol (VITAMIN D) 1000 UNITS tablet Take 1,000 Units by mouth daily.    . clopidogrel (PLAVIX) 75 MG tablet TAKE 1 TABLET BY MOUTH EVERY DAY 90 tablet 0  . CRESTOR 40 MG tablet TAKE 1 TABLET (40 MG TOTAL) BY MOUTH EVERY OTHER DAY. 30 tablet 4  . cycloSPORINE (RESTASIS) 0.05 % ophthalmic emulsion Place 1 drop into both eyes daily as needed (for dry eyes).    . fish oil-omega-3 fatty acids 1000 MG capsule Take 1 g by mouth 2 (two) times daily.    . fluticasone (FLONASE) 50 MCG/ACT nasal spray   2  . gabapentin (NEURONTIN)  100 MG capsule Take 100 mg by mouth as needed.     . Multiple Vitamin (MULTIVITAMIN WITH MINERALS) TABS Take 1 tablet by mouth daily.    . nitroGLYCERIN (NITROSTAT) 0.4 MG SL tablet Place 0.4 mg under the tongue every 5 (five) minutes as needed for chest pain.    . Probiotic Product (PROBIOTIC DAILY PO) Take 1 capsule by mouth daily.    . Psyllium (METAMUCIL) 30.9 % POWD Take by mouth 1 dose over 24 hours.      . vitamin B-12 (CYANOCOBALAMIN) 1000 MCG tablet Take 1,000 mcg by mouth daily.    . vitamin C (ASCORBIC ACID) 500 MG tablet Take 500 mg by mouth daily.     No current facility-administered medications for this visit.    Allergies  Allergen Reactions  . Fluoride Preparations     A test for eye to make pictures, causing pressure     History   Social History  . Marital Status: Married    Spouse Name: N/A  . Number of Children: 0  . Years of Education: N/A   Occupational History  . retired     Social History Main Topics  . Smoking status: Never Smoker   . Smokeless tobacco: Not on file  . Alcohol Use: 1.0 - 1.5 oz/week    2-3 drink(s) per week  . Drug Use: No  . Sexual Activity: Not on file   Other Topics Concern  . Not on file   Social History Narrative    Family History  Problem Relation Age of Onset  . Colon polyps Brother   . Heart disease Brother     before age 80  . Cancer Brother   . Hyperlipidemia Brother   . Heart attack Brother   . Colon polyps Paternal Grandfather   . Heart disease Father     before age 58  . Deep vein thrombosis Father   . Hyperlipidemia Father   . Heart attack Father   . Heart disease Brother   . Stroke Mother   . Heart disease Other     Grandmother     Review of Systems:  As stated in the HPI and otherwise negative.   BP 124/76 mmHg  Pulse 75  Ht 5\' 2"  (1.575 m)  Wt 173 lb (78.472 kg)  BMI 31.63 kg/m2  Physical Examination: General: Well developed, well nourished, NAD HEENT: OP clear, mucus membranes moist SKIN: warm, dry. No rashes. Neuro: No focal deficits Musculoskeletal: Muscle strength 5/5 all ext Psychiatric: Mood and affect normal Neck: No JVD, no carotid bruits, no thyromegaly, no lymphadenopathy. Lungs:Clear bilaterally, no wheezes, rhonci, crackles Cardiovascular: Regular rate and rhythm. No murmurs, gallops or rubs. Abdomen:Soft. Bowel sounds present. Non-tender.  Extremities: No lower extremity edema. Pulses are 2 + in the bilateral DP/PT.  Cardiac cath 11/27/11:  Left main:  Left Anterior Descending Artery: Large caliber vessel that courses to the apex. There is a patent stent in the proximal to mid LAD with minimal, 20% in stent restenosis. The diagonal branch arises just before the LAD stent. There is a patent stent in the ostium  and proximal segment of the diagonal branch with perhaps 20% ostial stenosis.  Circumflex Artery: Moderate sized vessel with no disease noted. Small early OM branch with no disease.  Right Coronary Artery: Large, dominant vessel with patent stent in the proximal vessel. Just before the stent, there is a 20% plaque. Just beyond the stent, there is a 30% plaque.  Left Ventricular Angiogram:  LVEF 65-70%.   Assessment and Plan:   1. CAD: Cardiac cath June 2013 with stable disease. Stress myoview July 2014 with no ischemia. Continue medical management with ASA, Plavix and statin.    2. HTN: BP is well controlled. No changes today.   3. HYPERLIPIDEMIA: Lipids well controlled on statin per pt. Followed in primary care. No changes today.   4. Carotid artery disease: Followed in [facility].

## 2014-08-22 ENCOUNTER — Other Ambulatory Visit: Payer: Self-pay | Admitting: Cardiovascular Disease

## 2014-08-22 ENCOUNTER — Ambulatory Visit (INDEPENDENT_AMBULATORY_CARE_PROVIDER_SITE_OTHER): Payer: Medicare Other | Admitting: Cardiovascular Disease

## 2014-08-22 ENCOUNTER — Encounter: Payer: Self-pay | Admitting: Cardiovascular Disease

## 2014-08-22 VITALS — BP 124/76 | HR 75 | Ht 62.0 in | Wt 173.0 lb

## 2014-08-22 DIAGNOSIS — E785 Hyperlipidemia, unspecified: Secondary | ICD-10-CM

## 2014-08-22 DIAGNOSIS — I2510 Atherosclerotic heart disease of native coronary artery without angina pectoris: Secondary | ICD-10-CM

## 2014-08-22 DIAGNOSIS — I10 Essential (primary) hypertension: Secondary | ICD-10-CM

## 2014-08-22 NOTE — Patient Instructions (Signed)
Your physician wants you to follow-up in:  6 months. You will receive a reminder letter in the mail two months in advance. If you don't receive a letter, please call our office to schedule the follow-up appointment.   

## 2014-08-26 ENCOUNTER — Ambulatory Visit: Payer: Medicare Other | Admitting: Family

## 2014-08-26 ENCOUNTER — Other Ambulatory Visit (HOSPITAL_COMMUNITY): Payer: Medicare Other

## 2014-09-01 ENCOUNTER — Encounter: Payer: Self-pay | Admitting: Family

## 2014-09-02 ENCOUNTER — Ambulatory Visit (INDEPENDENT_AMBULATORY_CARE_PROVIDER_SITE_OTHER): Payer: Medicare Other | Admitting: Family

## 2014-09-02 ENCOUNTER — Ambulatory Visit (HOSPITAL_COMMUNITY)
Admission: RE | Admit: 2014-09-02 | Discharge: 2014-09-02 | Disposition: A | Discharge status: Home / Self Care | Payer: Medicare Other | Source: Ambulatory Visit | Attending: Family | Admitting: Family

## 2014-09-02 ENCOUNTER — Encounter: Payer: Self-pay | Admitting: Family

## 2014-09-02 VITALS — BP 130/72 | HR 56 | Resp 14 | Ht 62.5 in | Wt 171.0 lb

## 2014-09-02 DIAGNOSIS — I6523 Occlusion and stenosis of bilateral carotid arteries: Secondary | ICD-10-CM

## 2014-09-02 NOTE — Progress Notes (Signed)
Established Carotid Patient   History of Present Illness  [name] is a 74 y.o. female patient of Dr. Bridgett Larsson who returns for routine surveillance of ICA stenosis. Previous carotid studies demonstrated: RICA 69-62% stenosis, LICA <95% stenosis. Patient has no history of TIA or stroke symptom. The patient has never had amaurosis fugax or monocular blindness. The patient has never had facial drooping or hemiplegia. The patient has never had receptive or expressive aphasia.   Patient has not had previous carotid artery intervention.  The patient denies any history of TIA or stroke symptoms, specifically the patient denies a history of amaurosis fugax or monocular blindness, denies a history unilateral  of facial drooping, denies a history of hemiplegia, and denies a history of receptive or expressive aphasia.   She has significant family hx for strokes and MI's.  She denies claudication symptoms with walking, walks 2 miles daily.  Pt states her diet and exercise have improved slightly.  The patient denies New Medical or Surgical History.  Pt Diabetic: No Pt smoker: non-smoker  Pt meds include: Statin : Yes ASA: Yes Other anticoagulants/antiplatelets: Plavix   Past Medical History  Diagnosis Date  . History of recurrent UTIs   . Hyperlipidemia   . Diverticulosis of colon   . CAD (coronary artery disease)     a. DES to LAD, diagonal 10/2006. b. DES to RCA 05/2007. c. Last cath 11/2012: patent stents, stable dz. d. Carlton Adam cardiolite negative 12/2012.  Marland Kitchen Hx of adenomatous colonic polyps   . Sleep apnea   . Peripheral vascular disease     Social History History  Substance Use Topics  . Smoking status: Never Smoker   . Smokeless tobacco: Never Used  . Alcohol Use: 1.0 - 1.5 oz/week    2-3 Standard drinks or equivalent per week    Family History Family History  Problem Relation Age of Onset  . Colon polyps Brother   . Heart disease Brother     before age 86  .  Cancer Brother   . Hyperlipidemia Brother   . Heart attack Brother   . Colon polyps Paternal Grandfather   . Heart disease Father     before age 51  . Deep vein thrombosis Father   . Hyperlipidemia Father   . Heart attack Father 60  . Heart disease Brother   . Stroke Mother     TIA's  . Heart disease Other     Grandmother     Surgical History Past Surgical History  Procedure Laterality Date  . Angioplasty  5/08  and  12/08     stent   . Right shoulder surgery    . Cataract left    . Vitrectomy left eye    . Eye surgery      Allergies  Allergen Reactions  . Fluoride Preparations Other (See Comments)    A test for eye to make pictures, (  causing pressure of Left arm )    Current Outpatient Prescriptions  Medication Sig Dispense Refill  . amLODipine (NORVASC) 2.5 MG tablet TAKE 1 TABLET BY MOUTH EVERY DAY 90 tablet 0  . aspirin 81 MG tablet Take 1 tablet (81 mg total) by mouth daily.    . calcium citrate (CALCITRATE - DOSED IN MG ELEMENTAL CALCIUM) 950 MG tablet Take 200 mg of elemental calcium by mouth daily.    . cholecalciferol (VITAMIN D) 1000 UNITS tablet Take 1,000 Units by mouth daily.    . clopidogrel (PLAVIX) 75 MG tablet  TAKE 1 TABLET BY MOUTH EVERY DAY 90 tablet 3  . CRESTOR 40 MG tablet TAKE 1 TABLET (40 MG TOTAL) BY MOUTH EVERY OTHER DAY. 30 tablet 4  . cycloSPORINE (RESTASIS) 0.05 % ophthalmic emulsion Place 1 drop into both eyes daily as needed (for dry eyes).    . fish oil-omega-3 fatty acids 1000 MG capsule Take 1 g by mouth 2 (two) times daily.    . fluticasone (FLONASE) 50 MCG/ACT nasal spray as needed.   2  . gabapentin (NEURONTIN) 100 MG capsule Take 100 mg by mouth as needed.     . Multiple Vitamin (MULTIVITAMIN WITH MINERALS) TABS Take 1 tablet by mouth daily.    . nitroGLYCERIN (NITROSTAT) 0.4 MG SL tablet Place 0.4 mg under the tongue every 5 (five) minutes as needed for chest pain.    . Probiotic Product (PROBIOTIC DAILY PO) Take 1 capsule by  mouth daily.    . Psyllium (METAMUCIL) 30.9 % POWD Take by mouth 1 dose over 24 hours.      . vitamin B-12 (CYANOCOBALAMIN) 1000 MCG tablet Take 1,000 mcg by mouth daily.    . vitamin C (ASCORBIC ACID) 500 MG tablet Take 500 mg by mouth daily.     No current facility-administered medications for this visit.    Review of Systems : See HPI for pertinent positives and negatives.  Physical Examination  Filed Vitals:   09/02/14 1114 09/02/14 1117  BP: 131/72 130/72  Pulse: 56 56  Resp:  14  Height:  5' 2.5" (1.588 m)  Weight:  171 lb (77.565 kg)  SpO2:  98%   Body mass index is 30.76 kg/(m^2).  General: WDWN female in NAD GAIT: normal Eyes: PERRLA Pulmonary:  Non-labored, CTAB, Negative  Rales, Negative rhonchi, & Negative wheezing.  Cardiac: regular Rhythm,  Negative detected murmur.  VASCULAR EXAM Carotid Bruits Right Left   Negative Negative    Aorta is not palpable. Radial pulses are 2+ palpable and equal.                                                                                                                            LE Pulses Right Left       POPLITEAL  not palpable   not palpable       POSTERIOR TIBIAL  not palpable   not palpable        DORSALIS PEDIS      ANTERIOR TIBIAL faintly palpable  faintly palpable     Gastrointestinal: soft, nontender, BS WNL, no r/g,  negative palpated masses.  Musculoskeletal: Negative muscle atrophy/wasting. M/S 5/5 throughout, Extremities without ischemic changes.  Neurologic: A&O X 3; Appropriate Affect ; SENSATION ;normal;  Speech is normal CN 2-12 intact, Pain and light touch intact in extremities, Motor exam as listed above.   Non-Invasive Vascular Imaging CAROTID DUPLEX 09/02/2014   CEREBROVASCULAR DUPLEX EVALUATION    INDICATION: Carotid artery disease     PREVIOUS INTERVENTION(S):  DUPLEX EXAM:     RIGHT  LEFT  Peak Systolic Velocities (cm/s) End Diastolic Velocities (cm/s) Plaque LOCATION Peak  Systolic Velocities (cm/s) End Diastolic Velocities (cm/s) Plaque  65 17  CCA PROXIMAL 78 24   64 22  CCA MID 59 20   65 24 HT CCA DISTAL 62 21 HT  78 18  ECA 76 15   121 48 HT ICA PROXIMAL 52 18 HT  113 49  ICA MID 66 22   156 58  ICA DISTAL 73 27     2.43 ICA / CCA Ratio (PSV) 1.23  Antegrade  Vertebral Flow Antegrade   939 Brachial Systolic Pressure (mmHg) 030  Triphasic  Brachial Artery Waveforms Triphasic     Plaque Morphology:  HM = Homogeneous, HT = Heterogeneous, CP = Calcific Plaque, SP = Smooth Plaque, IP = Irregular Plaque     ADDITIONAL FINDINGS:     IMPRESSION: Right internal carotid artery velocities suggest a 40-59% stenosis.  Left internal carotid artery velocities suggest a <40% stenosis.     Compared to the previous exam:  Velocities of the right internal carotid artery could not replicate those of the previous exam.      Assessment: [name] is a 74 y.o. female who has no history of stroke or TIA; does have rare episodes of veering to the left when walking, no known aggravating or alleviating factors. Her previous two carotid Duplex revealed 60-79% right ICA stenosis, today was 40-59%, left ICA remains at <40% stenosis.  Plan: Follow-up in 6 months with Carotid Duplex.   I discussed in depth with the patient the nature of atherosclerosis, and emphasized the importance of maximal medical management including strict control of blood pressure, blood glucose, and lipid levels, obtaining regular exercise, and continued cessation of smoking.  The patient is aware that without maximal medical management the underlying atherosclerotic disease process will progress, limiting the benefit of any interventions. The patient was given information about stroke prevention and what symptoms should prompt the patient to seek immediate medical care. Thank you for allowing [place] to participate in this patient's care.  Clemon Chambers, RN, MSN, FNP-C [facility] [place] Office: [phone]  Clinic Physician: Early on call  09/02/2014 11:35 AM

## 2014-09-02 NOTE — Patient Instructions (Signed)
Stroke Prevention Some medical conditions and behaviors are associated with an increased chance of having a stroke. You may prevent a stroke by making healthy choices and managing medical conditions. HOW CAN I REDUCE MY RISK OF HAVING A STROKE?   Stay physically active. Get at least 30 minutes of activity on most or all days.  Do not smoke. It may also be helpful to avoid exposure to secondhand smoke.  Limit alcohol use. Moderate alcohol use is considered to be:  No more than 2 drinks per day for men.  No more than 1 drink per day for nonpregnant women.  Eat healthy foods. This involves:  Eating 5 or more servings of fruits and vegetables a day.  Making dietary changes that address high blood pressure (hypertension), high cholesterol, diabetes, or obesity.  Manage your cholesterol levels.  Making food choices that are high in fiber and low in saturated fat, trans fat, and cholesterol may control cholesterol levels.  Take any prescribed medicines to control cholesterol as directed by your health care provider.  Manage your diabetes.  Controlling your carbohydrate and sugar intake is recommended to manage diabetes.  Take any prescribed medicines to control diabetes as directed by your health care provider.  Control your hypertension.  Making food choices that are low in salt (sodium), saturated fat, trans fat, and cholesterol is recommended to manage hypertension.  Take any prescribed medicines to control hypertension as directed by your health care provider.  Maintain a healthy weight.  Reducing calorie intake and making food choices that are low in sodium, saturated fat, trans fat, and cholesterol are recommended to manage weight.  Stop drug abuse.  Avoid taking birth control pills.  Talk to your health care provider about the risks of taking birth control pills if you are over 35 years old, smoke, get migraines, or have ever had a blood clot.  Get evaluated for sleep  disorders (sleep apnea).  Talk to your health care provider about getting a sleep evaluation if you snore a lot or have excessive sleepiness.  Take medicines only as directed by your health care provider.  For some people, aspirin or blood thinners (anticoagulants) are helpful in reducing the risk of forming abnormal blood clots that can lead to stroke. If you have the irregular heart rhythm of atrial fibrillation, you should be on a blood thinner unless there is a good reason you cannot take them.  Understand all your medicine instructions.  Make sure that other conditions (such as anemia or atherosclerosis) are addressed. SEEK IMMEDIATE MEDICAL CARE IF:   You have sudden weakness or numbness of the face, arm, or leg, especially on one side of the body.  Your face or eyelid droops to one side.  You have sudden confusion.  You have trouble speaking (aphasia) or understanding.  You have sudden trouble seeing in one or both eyes.  You have sudden trouble walking.  You have dizziness.  You have a loss of balance or coordination.  You have a sudden, severe headache with no known cause.  You have new chest pain or an irregular heartbeat. Any of these symptoms may represent a serious problem that is an emergency. Do not wait to see if the symptoms will go away. Get medical help at once. Call your local emergency services (911 in [place] Do not drive yourself to the hospital. Document Released: 07/11/2004 Document Revised: 10/18/2013 Document Reviewed: 12/04/2012 Exit[employer] Patient Information 2015 [employer]. This information is not intended to replace advice given   to you by your health care provider. Make sure you discuss any questions you have with your health care provider.  

## 2014-09-06 ENCOUNTER — Telehealth: Payer: Self-pay | Admitting: Cardiovascular Disease

## 2014-09-06 NOTE — Telephone Encounter (Signed)
New Msg         Pt states she is returning from call from today.    Please call back.

## 2014-09-06 NOTE — Telephone Encounter (Signed)
I do not see any calls placed from our office. I spoke with pt and gave her this information.  She does not have any questions.

## 2014-10-10 ENCOUNTER — Encounter: Payer: Self-pay | Admitting: Physician Assistant

## 2014-10-25 ENCOUNTER — Other Ambulatory Visit: Payer: Self-pay | Admitting: Cardiovascular Disease

## 2014-10-27 ENCOUNTER — Encounter: Payer: Self-pay | Admitting: Gastroenterology

## 2014-10-31 ENCOUNTER — Telehealth: Payer: Self-pay | Admitting: *Deleted

## 2014-10-31 ENCOUNTER — Ambulatory Visit (INDEPENDENT_AMBULATORY_CARE_PROVIDER_SITE_OTHER): Payer: Medicare Other | Admitting: Physician Assistant

## 2014-10-31 ENCOUNTER — Encounter: Payer: Self-pay | Admitting: Physician Assistant

## 2014-10-31 VITALS — BP 118/68 | HR 68 | Ht 63.0 in | Wt 172.2 lb

## 2014-10-31 DIAGNOSIS — Z7901 Long term (current) use of anticoagulants: Secondary | ICD-10-CM

## 2014-10-31 DIAGNOSIS — K5732 Diverticulitis of large intestine without perforation or abscess without bleeding: Secondary | ICD-10-CM

## 2014-10-31 DIAGNOSIS — K5909 Other constipation: Secondary | ICD-10-CM

## 2014-10-31 DIAGNOSIS — Z86010 Personal history of colonic polyps: Secondary | ICD-10-CM | POA: Diagnosis not present

## 2014-10-31 DIAGNOSIS — K5900 Constipation, unspecified: Secondary | ICD-10-CM

## 2014-10-31 MED ORDER — METRONIDAZOLE 250 MG PO TABS: 250.0000 mg | ORAL_TABLET | Freq: Four times a day (QID) | ORAL | Status: AC

## 2014-10-31 MED ORDER — POLYETHYLENE GLYCOL 3350 17 GM/SCOOP PO POWD: 1.0000 | Freq: Every day | ORAL | Status: DC

## 2014-10-31 MED ORDER — CIPROFLOXACIN HCL 500 MG PO TABS: 500.0000 mg | ORAL_TABLET | Freq: Two times a day (BID) | ORAL | Status: DC

## 2014-10-31 NOTE — Progress Notes (Signed)
Patient ID: [name], female   DOB: June 01, 1941, 74 y.o.   MRN: [id number]   Subjective:    Patient ID: [name], female    DOB: 1940-09-09, 74 y.o.   MRN: [id number]  HPI [name] is a pleasant 74 year old female known to Dr. Delfin Edis who comes in today to discuss recall colonoscopy. Her PCP is Dr. Virgina Jock, and patient relates that she was recently treated for an episode of diverticulitis about 1 month ago. Patient last had colonoscopy in 2009 for follow-up of history of adenomatous polyps. This exam was negative with the exception of diverticulosis. Patient is maintained on Plavix and aspirin for history of coronary artery disease status post drug-eluting stents. She's followed by Dr. Julianne Handler . Also has carotid stenosis, hypertension, and hyperlipidemia as well as sleep apnea. Exline No family history of colon cancer. She has history of chronic constipation which has been present for years. She says she's managing currently with Metamucil twice daily and stool softeners and is able to have a bowel movement every day though she doesn't feel that she evacuates her bowels infrequently has hard stools. She says she is still having some mild left lower quadrant discomfort, no melena or hematochezia.  Review of Systems Pertinent positive and negative review of systems were noted in the above HPI section.  All other review of systems was otherwise negative.  Outpatient Encounter Prescriptions as of 10/31/2014  Medication Sig  . amLODipine (NORVASC) 2.5 MG tablet TAKE 1 TABLET BY MOUTH EVERY DAY  . aspirin 81 MG tablet Take 1 tablet (81 mg total) by mouth daily.  . calcium citrate (CALCITRATE - DOSED IN MG ELEMENTAL CALCIUM) 950 MG tablet Take 200 mg of elemental calcium by mouth daily.  . cholecalciferol (VITAMIN D) 1000 UNITS tablet Take 1,000 Units by mouth daily.  . clopidogrel (PLAVIX) 75 MG tablet TAKE 1 TABLET BY MOUTH EVERY DAY  . CRESTOR 40 MG tablet TAKE 1 TABLET (40 MG TOTAL)  BY MOUTH EVERY OTHER DAY.  . cycloSPORINE (RESTASIS) 0.05 % ophthalmic emulsion Place 1 drop into both eyes daily as needed (for dry eyes).  . fish oil-omega-3 fatty acids 1000 MG capsule Take 1 g by mouth 2 (two) times daily.  . fluticasone (FLONASE) 50 MCG/ACT nasal spray as needed.   . Multiple Vitamin (MULTIVITAMIN WITH MINERALS) TABS Take 1 tablet by mouth daily.  . nitroGLYCERIN (NITROSTAT) 0.4 MG SL tablet Place 0.4 mg under the tongue every 5 (five) minutes as needed for chest pain.  . Probiotic Product (PROBIOTIC DAILY PO) Take 1 capsule by mouth daily.  . Psyllium (METAMUCIL) 30.9 % POWD Take by mouth 1 dose over 24 hours.    . vitamin B-12 (CYANOCOBALAMIN) 1000 MCG tablet Take 1,000 mcg by mouth daily.  . vitamin C (ASCORBIC ACID) 500 MG tablet Take 500 mg by mouth daily.  . ciprofloxacin (CIPRO) 500 MG tablet Take 1 tablet (500 mg total) by mouth 2 (two) times daily.  . metroNIDAZOLE (FLAGYL) 250 MG tablet Take 1 tablet (250 mg total) by mouth 4 (four) times daily.  . polyethylene glycol powder (GLYCOLAX/MIRALAX) powder Take 255 g by mouth daily.  . [DISCONTINUED] gabapentin (NEURONTIN) 100 MG capsule Take 100 mg by mouth as needed.   . [DISCONTINUED] polyethylene glycol powder (GLYCOLAX/MIRALAX) powder Take 255 g by mouth daily.   No facility-administered encounter medications on file as of 10/31/2014.   Allergies  Allergen Reactions  . Fluoride Preparations Other (See Comments)    A test for  eye to make pictures, (  causing pressure of Left arm )   Patient Active Problem List   Diagnosis Date Noted  . 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 HTN (hypertension) 11/25/2011  . RECTAL BLEEDING 03/16/2010  . HYPERLIPIDEMIA 02/15/2008  . CAD 02/15/2008  . DIVERTICULOSIS OF COLON 02/15/2008  . UTI'S, HX OF 02/15/2008  . COLONIC POLYPS, ADENOMATOUS, HX OF 02/12/2008   History   Social History  . Marital Status: Married     Spouse Name: N/A  . Number of Children: 0  . Years of Education: N/A   Occupational History  . retired     Social History Main Topics  . Smoking status: Never Smoker   . Smokeless tobacco: Never Used  . Alcohol Use: 1.0 - 1.5 oz/week    2-3 Standard drinks or equivalent per week  . Drug Use: No  . Sexual Activity: Not on file   Other Topics Concern  . Not on file   Social History Narrative    Ms. Paiz's family history includes Cancer in her brother; Colon polyps in her brother and paternal grandfather; Deep vein thrombosis in her father; Heart attack in her brother; Heart attack (age of onset: 8) in her father; Heart disease in her brother, brother, father, and other; Hyperlipidemia in her brother and father; Stroke in her mother.      Objective:    Filed Vitals:   10/31/14 0900  BP: 118/68  Pulse: 68    Physical Exam  well-developed older white female in no acute distress, pleasant blood pressure 118/68. HEENT; nontraumatic normocephalic EOMI PERRLA sclera anicteric, Supple; no JVD, Cardiovascular; regular rate and rhythm with S1-S2 no murmur rub or gallop, Pulmonary clear bilaterally, Abdomen ;soft bowel sounds are active she is tender in the left lower quadrant there is no guarding or rebound no palpable mass or hepatosplenomegaly bowel sounds are present, Rectal; exam not done, Ext; no clubbing cyanosis or edema skin warm and dry, Psych; mood and affect appropriate       Assessment & Plan:   #1 74 yo female with hx of adenomatous polyps , negative colonoscopy 2009 - due for follow up/ #2 Recent diverticulitis - suspect still has some smoldering mild diverticulitis #3 Chronic  Antiplatelet therapy- Plavix and ASA #4 CAD -s/p stents -remote #5 HTN #6 carotid stenosis #7 chronic constipation  Plan; Will give another 7 day course of Cipro 500 mg BID and Flagyl 500 mg BID. Schedule for Colonoscopy with Dr Olevia Perches. Procedure discussed in detail with pt and she is  agreeable to proceed. We will communicate with her cardiologist Dr Julianne Handler to assure that holding Plavix for 5 days prior to Colonoscopy is reasonable for this pt. She is aware that she should  call if she feels diverticulitis is not completely resolved prior to colonoscopy . She will continue BID metamucil and add Mralax half to one dose daily in 8 oz glass of water   Amy S Esterwood PA-C 10/31/2014   Cc: Shon Baton, MD

## 2014-10-31 NOTE — Telephone Encounter (Signed)
10/31/2014   RE: LINDZY RUPERT DOB: 04-Sep-1940 MRN: [id number]   Dear Dr. Lauree Chandler,    We have scheduled the above patient for an endoscopic procedure. Our records show that she is on anticoagulation therapy.   Please advise as to how long the patient may come off her therapy of Plavix prior to the procedure, which is scheduled for 11-23-2014.  Please fax back/ or route the completed form to [facility] at [phone].   Sincerely,    Amy Esterwood PA-C

## 2014-10-31 NOTE — Telephone Encounter (Signed)
Ms. Coyne can hold her ASA and Plavix 7 days before her planned procedure and restart when safe from a surgical standpoint. Darlina Guys, MD

## 2014-10-31 NOTE — Patient Instructions (Addendum)
You have been scheduled for a colonoscopy. Please follow written instructions given to you at your visit today.  Please pick up your prep supplies at the pharmacy within the next 1-3 days. [facility].  We also sent prescriptions for Cipro and Metronidazole.  If you use inhalers (even only as needed), please bring them with you on the day of your procedure. Your physician has requested that you go to www.startemmi.com and enter the access code given to you at your visit today. This web site gives a general overview about your procedure. However, you should still follow specific instructions given to you by our office regarding your preparation for the procedure.  Add Miralax 1/2 to one dose daily for constipation.

## 2014-10-31 NOTE — Progress Notes (Signed)
Reviewed and agree with delaying colonoscopy till abd. Pain resolversd.

## 2014-10-31 NOTE — Telephone Encounter (Signed)
10/31/2014   RE: [name] DOB: [birth year] MRN: [id number]   Dear Dr. Lauree Chandler,    We have scheduled the above patient for an endoscopic procedure. Our records show that she is on anticoagulation therapy.   Please advise as to how long the patient may come off her therapy of Plavix prior to the procedure, which is scheduled for 11-23-2014.  Please fax back/ or route the completed form to [facility] at [phone].   Sincerely,    Amy Esterwood PA-C

## 2014-11-01 ENCOUNTER — Encounter: Payer: Self-pay | Admitting: Internal Medicine

## 2014-11-02 NOTE — Telephone Encounter (Signed)
The patient called me back and wanted me to know she got my message regarding the Plavix clearance information.  She is to stop the Plavix on 11-16-14 and resume it on 11-24-2014 unless otherwise instructed by Dr. Olevia Perches after the procedure.

## 2014-11-02 NOTE — Telephone Encounter (Signed)
LM for the patient to please call me regarding her Plavix clearance for the colonoscopy.

## 2014-11-07 DIAGNOSIS — I1310 Hypertensive heart and chronic kidney disease without heart failure, with stage 1 through stage 4 chronic kidney disease, or unspecified chronic kidney disease: Secondary | ICD-10-CM | POA: Insufficient documentation

## 2014-11-08 DIAGNOSIS — D692 Other nonthrombocytopenic purpura: Secondary | ICD-10-CM | POA: Insufficient documentation

## 2014-11-18 ENCOUNTER — Encounter: Payer: Self-pay | Admitting: Internal Medicine

## 2014-11-23 ENCOUNTER — Encounter: Payer: Self-pay | Admitting: Internal Medicine

## 2014-11-23 ENCOUNTER — Ambulatory Visit (AMBULATORY_SURGERY_CENTER): Payer: Medicare Other | Admitting: Internal Medicine

## 2014-11-23 VITALS — BP 104/64 | HR 58 | Temp 96.9°F | Resp 18 | Ht 63.0 in | Wt 172.0 lb

## 2014-11-23 DIAGNOSIS — Z86010 Personal history of colonic polyps: Secondary | ICD-10-CM | POA: Diagnosis present

## 2014-11-23 DIAGNOSIS — D120 Benign neoplasm of cecum: Secondary | ICD-10-CM | POA: Diagnosis not present

## 2014-11-23 MED ORDER — SODIUM CHLORIDE 0.9 % IV SOLN: 500.0000 mL | INTRAVENOUS | Status: DC

## 2014-11-23 NOTE — Progress Notes (Signed)
A/ox3, pleased with MAC, report to RN 

## 2014-11-23 NOTE — Op Note (Signed)
[name]. [place], [id number]   COLONOSCOPY PROCEDURE REPORT  PATIENT: [name], [name]  MR#: [id number] BIRTHDATE: 1941/04/01 , 73  yrs. old GENDER: female ENDOSCOPIST: Lafayette Dragon, MD REFERRED EP:PIRJ Virgina Jock, M.D. PROCEDURE DATE:  11/23/2014 PROCEDURE:   Colonoscopy, screening, Colonoscopy with cold biopsy polypectomy, and Colonoscopy with snare polypectomy First Screening Colonoscopy - Avg.  risk and is 50 yrs.  old or older - No.  Prior Negative Screening - Now for repeat screening. N/A  History of Adenoma - Now for follow-up colonoscopy & has been > or = to 3 yrs.  Yes hx of adenoma.  Has been 3 or more years since last colonoscopy.  Polyps removed today? Yes ASA CLASS:   Class III INDICATIONS:Screening for colonic neoplasia, Colorectal Neoplasm Risk Assessment for this procedure is average risk, and ttubular adenoma removed in 2001.  2 tubular adenomas removed in 2005.  Last colonoscopy in 2009 showed diverticulosis.  Patient has been on Plavix which was discontinued 5 days ago she has recent episode of diverticulitis. MEDICATIONS: Monitored anesthesia care and Propofol 200 mg IV  DESCRIPTION OF PROCEDURE:   After the risks benefits and alternatives of the procedure were thoroughly explained, informed consent was obtained.  The digital rectal exam revealed no abnormalities of the rectum.   The LB [id number]  endoscope was introduced through the anus and advanced to the cecum, which was identified by both the appendix and ileocecal valve. No adverse events experienced.   The quality of the prep was good.  (MoviPrep was used)  The instrument was then slowly withdrawn as the colon was fully examined. Estimated blood loss is zero unless otherwise noted in this procedure report.      COLON FINDINGS: Two sessile polyps measuring 3 mm in size were found at the cecum.  A polypectomy was performed with cold forceps.  The resection was  complete, the polyp tissue was completely retrieved and sent to histology.  A polypectomy was performed with a cold snare.  The resection was complete, the polyp tissue was completely retrieved and sent to histology.   There was mild diverticulosis noted in the sigmoid colon and descending colon with associated muscular hypertrophy.   Small internal Grade I hemorrhoids were found.  Retroflexed views revealed no abnormalities. The time to cecum = 4.24 Withdrawal time = 13.55   The scope was withdrawn and the procedure completed. COMPLICATIONS: There were no immediate complications.  ENDOSCOPIC IMPRESSION: 1.   Two sessile polyps were found at the cecum; polypectomy was performed with cold forceps; polypectomy was performed with a cold snare 2.   There was mild diverticulosis noted in the sigmoid colon and descending colon 3.   Small internal Grade I hemorrhoids  RECOMMENDATIONS: 1.  Await pathology results 2.  Resume Plavix in 5 days Recall colonoscopy pending path report  eSigned:  Lafayette Dragon, MD 11/23/2014 9:42 AM   cc:   PATIENT NAME:  [name], [name] MR#: [id number]

## 2014-11-23 NOTE — Progress Notes (Signed)
Called to room to assist during endoscopic procedure.  Patient ID and intended procedure confirmed with present staff. Received instructions for my participation in the procedure from the performing physician.  

## 2014-11-23 NOTE — Patient Instructions (Addendum)
YOU HAD AN ENDOSCOPIC PROCEDURE TODAY AT [facility]:   Refer to the procedure report that was given to you for any specific questions about what was found during the examination.  If the procedure report does not answer your questions, please call your gastroenterologist to clarify.  If you requested that your care partner not be given the details of your procedure findings, then the procedure report has been included in a sealed envelope for you to review at your convenience later.  YOU SHOULD EXPECT: Some feelings of bloating in the abdomen. Passage of more gas than usual.  Walking can help get rid of the air that was put into your GI tract during the procedure and reduce the bloating. If you had a lower endoscopy (such as a colonoscopy or flexible sigmoidoscopy) you may notice spotting of blood in your stool or on the toilet paper. If you underwent a bowel prep for your procedure, you may not have a normal bowel movement for a few days.  Please Note:  You might notice some irritation and congestion in your nose or some drainage.  This is from the oxygen used during your procedure.  There is no need for concern and it should clear up in a day or so.  SYMPTOMS TO REPORT IMMEDIATELY:   Following lower endoscopy (colonoscopy or flexible sigmoidoscopy):  Excessive amounts of blood in the stool  Significant tenderness or worsening of abdominal pains  Swelling of the abdomen that is new, acute  Fever of 100F or higher  For urgent or emergent issues, a gastroenterologist can be reached at any hour by calling [phone].   DIET: Your first meal following the procedure should be a small meal and then it is ok to progress to your normal diet. Heavy or fried foods are harder to digest and may make you feel nauseous or bloated.  Likewise, meals heavy in dairy and vegetables can increase bloating.  Drink plenty of fluids but you should avoid alcoholic beverages for 24  hours.  ACTIVITY:  You should plan to take it easy for the rest of today and you should NOT DRIVE or use heavy machinery until tomorrow (because of the sedation medicines used during the test).    FOLLOW UP: Our staff will call the number listed on your records the next business day following your procedure to check on you and address any questions or concerns that you may have regarding the information given to you following your procedure. If we do not reach you, we will leave a message.  However, if you are feeling well and you are not experiencing any problems, there is no need to return our call.  We will assume that you have returned to your regular daily activities without incident.  If any biopsies were taken you will be contacted by phone or by letter within the next 1-3 weeks.  Please call us at [phone] if you have not heard about the biopsies in 3 weeks.    SIGNATURES/CONFIDENTIALITY: You and/or your care partner have signed paperwork which will be entered into your electronic medical record.  These signatures attest to the fact that that the information above on your After Visit Summary has been reviewed and is understood.  Full responsibility of the confidentiality of this discharge information lies with you and/or your care-partner.  Resume plavix in 5 days. Please review polyp, diverticulosis, high fiber diet, and hemorrhoid handouts provided. Next colonoscopy determined by pathology results.

## 2014-11-24 ENCOUNTER — Telehealth: Payer: Self-pay | Admitting: *Deleted

## 2014-11-24 NOTE — Telephone Encounter (Signed)
  Follow up Call-  Call back number 11/23/2014  Post procedure Call Back phone  # [phone]  Permission to leave phone message Yes     Patient questions:  Do you have a fever, pain , or abdominal swelling? No. Pain Score  0 *  Have you tolerated food without any problems? Yes.    Have you been able to return to your normal activities? Yes.    Do you have any questions about your discharge instructions: Diet   No. Medications  No. Follow up visit  No.  Do you have questions or concerns about your Care? No.  Actions: * If pain score is 4 or above: No action needed, pain <4.

## 2014-11-30 ENCOUNTER — Encounter: Payer: Self-pay | Admitting: Internal Medicine

## 2014-12-01 ENCOUNTER — Other Ambulatory Visit: Payer: Self-pay

## 2014-12-01 DIAGNOSIS — Z1231 Encounter for screening mammogram for malignant neoplasm of breast: Secondary | ICD-10-CM

## 2015-01-12 ENCOUNTER — Ambulatory Visit
Admission: RE | Admit: 2015-01-12 | Discharge: 2015-01-12 | Disposition: A | Discharge status: Home / Self Care | Payer: Medicare Other | Source: Ambulatory Visit

## 2015-01-12 DIAGNOSIS — Z1231 Encounter for screening mammogram for malignant neoplasm of breast: Secondary | ICD-10-CM

## 2015-01-16 ENCOUNTER — Other Ambulatory Visit: Payer: Self-pay | Admitting: Cardiovascular Disease

## 2015-02-02 ENCOUNTER — Telehealth: Payer: Self-pay | Admitting: *Deleted

## 2015-02-02 NOTE — Telephone Encounter (Signed)
I spoke with pt. She reports surgery is not urgent but she has been having problem with foot for awhile and would like to proceed.  Surgery will be scheduled once Dr. Sharol Given has availability and she has been cleared.  Pt reports she gets tired when working out in her garden in the heat but is otherwise feeling OK.  No chest pain,arm pain or shortness of breath. Will forward to Dr. Angelena Form to see if pt can be cleared for surgery

## 2015-02-02 NOTE — Telephone Encounter (Signed)
Received request for surgical clearance from [facility] (Dr. Sharol Given).  I placed call to pt to see when surgery is scheduled and how she is feeling.  Left message to call back

## 2015-02-03 NOTE — Telephone Encounter (Signed)
I spoke to her this am. She has no exertional chest pain or dyspnea. She is feeling well. I think she can proceed with her planned surgical procedure. She could hold her Plavix for 7 days before the procedure if necessary. I will fill out the paperwork in the office and send to her surgeon. cdm

## 2015-02-06 NOTE — Telephone Encounter (Signed)
Completed paperwork placed in medical records box to be faxed to [facility]

## 2015-03-07 HISTORY — PX: FOOT TENDON SURGERY: SHX958

## 2015-03-10 ENCOUNTER — Encounter (HOSPITAL_COMMUNITY): Payer: Medicare Other

## 2015-03-10 ENCOUNTER — Ambulatory Visit: Payer: Medicare Other | Admitting: Family

## 2015-03-15 ENCOUNTER — Other Ambulatory Visit: Payer: Self-pay | Admitting: Cardiovascular Disease

## 2015-03-27 NOTE — Progress Notes (Signed)
Chief Complaint  Patient presents with  . Dizziness      History of Present Illness: 74 yo WF with h/o CAD with previous drug eluting stents mid LAD, diagonal and proximal/mid RCA as well as HTN, hyperlipidemia, obstructive sleep apnea, carotid artery disease here today for cardiac follow up. She has been followed in the past by Dr. Fransico Him. I saw her for the first time on 04/22/11. Her cardiac history includes drug eluting stents LAD and Diagonal May 2008, followed by DES RCA in December 2008. Stress test February 2011 in [facility] with no ischemia, normal LVEF at 88%. At her initial visit in our office on 04/22/11, she had c/o left arm pain and her prior anginal equivalent was left arm pain. I arranged a treadmill stress test on 04/24/11. She exercised for 6 minutes and 34 seconds. There were no ischemic EKG changes. She did have tingling in her arm with exercise but no chest pain. I saw her June 2013 and she c/o recurrence of pain in her left arm with exertion associated with fatigue and SOB. Cardiac cath in June 2013 with stable disease, patent stents LAD, Diagonal, RCA. Admitted July 2014 with left shoulder pain. Stress myoview 12/28/12 with no ischemia. She had some left sided weakness in March 2015 and was seen in [facility]. Found to have 60-79% RICA stenosis, <43% LICA stenosis. She has been followed in [facility] by Dr. Bridgett Larsson.   She is here today for follow up. She has had no chest pain or SOB. She has been walking 1.5 to 2 miles per day. She has had some dizziness today. She has had a cough and post nasal drip.   Primary Care Physician: Shon Baton  Last Lipid Profile: Followed in primary care   Past Medical History  Diagnosis Date  . History of recurrent UTIs   . Hyperlipidemia   . Diverticulosis of colon   . CAD (coronary artery disease)     a. DES to LAD, diagonal 10/2006. b. DES to RCA 05/2007. c. Last cath 11/2012: patent stents, stable dz. d. Carlton Adam cardiolite negative 12/2012.    Marland Kitchen Hx of adenomatous colonic polyps     tubular  . Peripheral vascular disease ([place])   . Hemorrhoids   . Cataract     left eye  . Sleep apnea     wears CPAP  . Hypertension   . Diverticulitis     Past Surgical History  Procedure Laterality Date  . Angioplasty  5/08  and  12/08     stent x3  . Right shoulder surgery    . Cataract left    . Vitrectomy left eye    . Eye surgery      Current Outpatient Prescriptions  Medication Sig Dispense Refill  . amLODipine (NORVASC) 2.5 MG tablet TAKE 1 TABLET BY MOUTH EVERY DAY 90 tablet 0  . aspirin 81 MG tablet Take 1 tablet (81 mg total) by mouth daily.    . calcium citrate (CALCITRATE - DOSED IN MG ELEMENTAL CALCIUM) 950 MG tablet Take 200 mg of elemental calcium by mouth daily.    . cholecalciferol (VITAMIN D) 1000 UNITS tablet Take 1,000 Units by mouth daily.    . clopidogrel (PLAVIX) 75 MG tablet TAKE 1 TABLET BY MOUTH EVERY DAY 90 tablet 3  . cycloSPORINE (RESTASIS) 0.05 % ophthalmic emulsion Place 1 drop into both eyes daily as needed (for dry eyes).    . fish oil-omega-3 fatty acids 1000 MG capsule Take 1  g by mouth 2 (two) times daily.    . fluticasone (FLONASE) 50 MCG/ACT nasal spray Place 1 spray into both nostrils as needed for allergies.   2  . Multiple Vitamin (MULTIVITAMIN WITH MINERALS) TABS Take 1 tablet by mouth daily.    . nitroGLYCERIN (NITROSTAT) 0.4 MG SL tablet Place 0.4 mg under the tongue every 5 (five) minutes as needed for chest pain.    . polyethylene glycol powder (GLYCOLAX/MIRALAX) powder Take 255 g by mouth daily. 238 g 0  . Probiotic Product (PROBIOTIC DAILY PO) Take 1 capsule by mouth daily.    . Psyllium (METAMUCIL) 30.9 % POWD Take by mouth 1 dose over 24 hours.      . rosuvastatin (CRESTOR) 40 MG tablet TAKE 1 TABLET (40 MG TOTAL) BY MOUTH EVERY OTHER DAY. 30 tablet 4  . vitamin B-12 (CYANOCOBALAMIN) 1000 MCG tablet Take 1,000 mcg by mouth daily.    . vitamin C (ASCORBIC ACID) 500 MG tablet Take 500 mg  by mouth daily.     No current facility-administered medications for this visit.    Allergies  Allergen Reactions  . Fluoride Preparations Other (See Comments)    A test for eye to make pictures, (  causing pressure of Left arm )    Social History   Social History  . Marital Status: Married    Spouse Name: N/A  . Number of Children: 0  . Years of Education: N/A   Occupational History  . retired     Social History Main Topics  . Smoking status: Never Smoker   . Smokeless tobacco: Never Used  . Alcohol Use: 1.0 - 1.5 oz/week    2-3 Standard drinks or equivalent per week  . Drug Use: No  . Sexual Activity: Not on file   Other Topics Concern  . Not on file   Social History Narrative    Family History  Problem Relation Age of Onset  . Colon polyps Brother   . Heart disease Brother     before age 31  . Cancer Brother   . Hyperlipidemia Brother   . Heart attack Brother   . Colon polyps Paternal Grandfather   . Heart disease Father     before age 78  . Deep vein thrombosis Father   . Hyperlipidemia Father   . Heart attack Father 57  . Heart disease Brother   . Stroke Mother     TIA's  . Heart disease Other     Grandmother   . Colon cancer Neg Hx   . Esophageal cancer Neg Hx   . Stomach cancer Neg Hx   . Rectal cancer Neg Hx     Review of Systems:  As stated in the HPI and otherwise negative.   BP 119/70 mmHg  Pulse 69  Ht 5\' 3"  (1.6 m)  Wt 167 lb (75.751 kg)  BMI 29.59 kg/m2  Physical Examination: General: Well developed, well nourished, NAD HEENT: OP clear, mucus membranes moist SKIN: warm, dry. No rashes. Neuro: No focal deficits Musculoskeletal: Muscle strength 5/5 all ext Psychiatric: Mood and affect normal Neck: No JVD, no carotid bruits, no thyromegaly, no lymphadenopathy. Lungs:Clear bilaterally, no wheezes, rhonci, crackles Cardiovascular: Regular rate and rhythm. No murmurs, gallops or rubs. Abdomen:Soft. Bowel sounds present.  Non-tender.  Extremities: No lower extremity edema. Pulses are 2 + in the bilateral DP/PT.  Cardiac cath 11/27/11:  Left main:  Left Anterior Descending Artery: Large caliber vessel that courses to the apex. There is  a patent stent in the proximal to mid LAD with minimal, 20% in stent restenosis. The diagonal branch arises just before the LAD stent. There is a patent stent in the ostium and proximal segment of the diagonal branch with perhaps 20% ostial stenosis.  Circumflex Artery: Moderate sized vessel with no disease noted. Small early OM branch with no disease.  Right Coronary Artery: Large, dominant vessel with patent stent in the proximal vessel. Just before the stent, there is a 20% plaque. Just beyond the stent, there is a 30% plaque.  Left Ventricular Angiogram: LVEF 65-70%.   EKG:  EKG is ordered today. The ekg ordered today demonstrates NSR, rate 69 bpm. Poor R wave progression precordial leads.   Recent Labs: No results found for requested labs within last 365 days.   Lipid Panel    Component Value Date/Time   CHOL 159 12/16/2012 0902   TRIG 132.0 12/16/2012 0902   HDL 56.00 12/16/2012 0902   CHOLHDL 3 12/16/2012 0902   VLDL 26.4 12/16/2012 0902   LDLCALC 77 12/16/2012 0902     Wt Readings from Last 3 Encounters:  03/28/15 167 lb (75.751 kg)  11/23/14 172 lb (78.019 kg)  10/31/14 172 lb 4 oz (78.132 kg)     Other studies Reviewed: Additional studies/ records that were reviewed today include: . Review of the above records demonstrates:    Assessment and Plan:   1. CAD: Cardiac cath June 2013 with stable disease. Stress myoview July 2014 with no ischemia. Continue medical management with ASA, Plavix and statin.    2. HTN: BP is well controlled. No changes today.   3. HYPERLIPIDEMIA: Lipids well controlled on statin per pt. Followed in primary care. No changes today.   4. Carotid artery disease: Followed in [facility]. Last carotid dopplers March 2016 stable.   5.  Dizziness: Not likely cardiac related as BP, HR ok, EKG ok. Carotid disease is only moderate. Likely due to sinus congestion.   Current medicines are reviewed at length with the patient today.  The patient does not have concerns regarding medicines.  The following changes have been made:  no change  Labs/ tests ordered today include:  No orders of the defined types were placed in this encounter.    Disposition:   FU with me in 6 months  Signed, Lauree Chandler, MD 03/28/2015 8:55 AM    [facility] [address], [place], [place]  [address] Phone: [phone]; Fax: [phone]

## 2015-03-28 ENCOUNTER — Ambulatory Visit (INDEPENDENT_AMBULATORY_CARE_PROVIDER_SITE_OTHER): Payer: Medicare Other | Admitting: Cardiovascular Disease

## 2015-03-28 ENCOUNTER — Encounter: Payer: Self-pay | Admitting: Cardiovascular Disease

## 2015-03-28 VITALS — BP 119/70 | HR 69 | Ht 63.0 in | Wt 167.0 lb

## 2015-03-28 DIAGNOSIS — I10 Essential (primary) hypertension: Secondary | ICD-10-CM | POA: Diagnosis not present

## 2015-03-28 DIAGNOSIS — I6523 Occlusion and stenosis of bilateral carotid arteries: Secondary | ICD-10-CM | POA: Diagnosis not present

## 2015-03-28 DIAGNOSIS — I2510 Atherosclerotic heart disease of native coronary artery without angina pectoris: Secondary | ICD-10-CM

## 2015-03-28 DIAGNOSIS — E785 Hyperlipidemia, unspecified: Secondary | ICD-10-CM

## 2015-03-28 NOTE — Patient Instructions (Signed)

## 2015-04-10 ENCOUNTER — Other Ambulatory Visit: Payer: Self-pay | Admitting: Family

## 2015-04-10 ENCOUNTER — Ambulatory Visit (HOSPITAL_COMMUNITY)
Admission: RE | Admit: 2015-04-10 | Discharge: 2015-04-10 | Disposition: A | Discharge status: Home / Self Care | Payer: Medicare Other | Source: Ambulatory Visit | Attending: Family | Admitting: Family

## 2015-04-10 ENCOUNTER — Encounter: Payer: Self-pay | Admitting: Family

## 2015-04-10 ENCOUNTER — Ambulatory Visit (INDEPENDENT_AMBULATORY_CARE_PROVIDER_SITE_OTHER): Payer: Medicare Other | Admitting: Family

## 2015-04-10 VITALS — BP 102/72 | HR 68 | Temp 97.7°F | Resp 16 | Ht 63.0 in | Wt 169.0 lb

## 2015-04-10 DIAGNOSIS — I10 Essential (primary) hypertension: Secondary | ICD-10-CM | POA: Diagnosis not present

## 2015-04-10 DIAGNOSIS — E785 Hyperlipidemia, unspecified: Secondary | ICD-10-CM | POA: Insufficient documentation

## 2015-04-10 DIAGNOSIS — I6523 Occlusion and stenosis of bilateral carotid arteries: Secondary | ICD-10-CM

## 2015-04-10 NOTE — Patient Instructions (Signed)
Stroke Prevention Some medical conditions and behaviors are associated with an increased chance of having a stroke. You may prevent a stroke by making healthy choices and managing medical conditions. HOW CAN I REDUCE MY RISK OF HAVING A STROKE?   Stay physically active. Get at least 30 minutes of activity on most or all days.  Do not smoke. It may also be helpful to avoid exposure to secondhand smoke.  Limit alcohol use. Moderate alcohol use is considered to be:  No more than 2 drinks per day for men.  No more than 1 drink per day for nonpregnant women.  Eat healthy foods. This involves:  Eating 5 or more servings of fruits and vegetables a day.  Making dietary changes that address high blood pressure (hypertension), high cholesterol, diabetes, or obesity.  Manage your cholesterol levels.  Making food choices that are high in fiber and low in saturated fat, trans fat, and cholesterol may control cholesterol levels.  Take any prescribed medicines to control cholesterol as directed by your health care provider.  Manage your diabetes.  Controlling your carbohydrate and sugar intake is recommended to manage diabetes.  Take any prescribed medicines to control diabetes as directed by your health care provider.  Control your hypertension.  Making food choices that are low in salt (sodium), saturated fat, trans fat, and cholesterol is recommended to manage hypertension.  Ask your health care provider if you need treatment to lower your blood pressure. Take any prescribed medicines to control hypertension as directed by your health care provider.  If you are 18-39 years of age, have your blood pressure checked every 3-5 years. If you are 40 years of age or older, have your blood pressure checked every year.  Maintain a healthy weight.  Reducing calorie intake and making food choices that are low in sodium, saturated fat, trans fat, and cholesterol are recommended to manage  weight.  Stop drug abuse.  Avoid taking birth control pills.  Talk to your health care provider about the risks of taking birth control pills if you are over 35 years old, smoke, get migraines, or have ever had a blood clot.  Get evaluated for sleep disorders (sleep apnea).  Talk to your health care provider about getting a sleep evaluation if you snore a lot or have excessive sleepiness.  Take medicines only as directed by your health care provider.  For some people, aspirin or blood thinners (anticoagulants) are helpful in reducing the risk of forming abnormal blood clots that can lead to stroke. If you have the irregular heart rhythm of atrial fibrillation, you should be on a blood thinner unless there is a good reason you cannot take them.  Understand all your medicine instructions.  Make sure that other conditions (such as anemia or atherosclerosis) are addressed. SEEK IMMEDIATE MEDICAL CARE IF:   You have sudden weakness or numbness of the face, arm, or leg, especially on one side of the body.  Your face or eyelid droops to one side.  You have sudden confusion.  You have trouble speaking (aphasia) or understanding.  You have sudden trouble seeing in one or both eyes.  You have sudden trouble walking.  You have dizziness.  You have a loss of balance or coordination.  You have a sudden, severe headache with no known cause.  You have new chest pain or an irregular heartbeat. Any of these symptoms may represent a serious problem that is an emergency. Do not wait to see if the symptoms will   go away. Get medical help at once. Call your local emergency services (911 in [place] Do not drive yourself to the hospital.   This information is not intended to replace advice given to you by your health care provider. Make sure you discuss any questions you have with your health care provider.   Document Released: 07/11/2004 Document Revised: 06/24/2014 Document Reviewed:  12/04/2012 Elsevier Interactive Patient Education 2016 Elsevier Inc.  

## 2015-04-10 NOTE — Progress Notes (Signed)
Established Carotid Patient   History of Present Illness  [name] is a 74 y.o. female patient of Dr. Bridgett Larsson who returns for routine surveillance of ICA stenosis. Previous carotid studies demonstrated: RICA 61-95% stenosis, LICA <09% stenosis. Patient has not had previous carotid artery intervention.  The patient denies any history of TIA or stroke symptoms, specifically the patient denies a history of amaurosis fugax or monocular blindness, denies a history unilateral of facial drooping, denies a history of hemiplegia, and denies a history of receptive or expressive aphasia.  She has significant family hx for strokes and MI's.  She denies claudication symptoms with walking, walks 2 miles daily.  Pt states her diet and exercise have improved slightly.  The patient denies New Medical or Surgical History.  Pt Diabetic: No Pt smoker: non-smoker  Pt meds include: Statin : Yes ASA: Yes Other anticoagulants/antiplatelets: Plavix   Past Medical History  Diagnosis Date  . History of recurrent UTIs   . Hyperlipidemia   . Diverticulosis of colon   . CAD (coronary artery disease)     a. DES to LAD, diagonal 10/2006. b. DES to RCA 05/2007. c. Last cath 11/2012: patent stents, stable dz. d. Carlton Adam cardiolite negative 12/2012.  Marland Kitchen Hx of adenomatous colonic polyps     tubular  . Peripheral vascular disease ([place])   . Hemorrhoids   . Cataract     left eye  . Sleep apnea     wears CPAP  . Hypertension   . Diverticulitis     Social History Social History  Substance Use Topics  . Smoking status: Never Smoker   . Smokeless tobacco: Never Used  . Alcohol Use: 1.0 - 1.5 oz/week    2-3 Standard drinks or equivalent per week    Family History Family History  Problem Relation Age of Onset  . Colon polyps Brother   . Heart disease Brother     before age 40  . Cancer Brother   . Hyperlipidemia Brother   . Heart attack Brother   . Colon polyps Paternal Grandfather   .  Heart disease Father     before age 90  . Deep vein thrombosis Father   . Hyperlipidemia Father   . Heart attack Father 88  . Heart disease Brother   . Stroke Mother     TIA's  . Heart disease Other     Grandmother   . Colon cancer Neg Hx   . Esophageal cancer Neg Hx   . Stomach cancer Neg Hx   . Rectal cancer Neg Hx     Surgical History Past Surgical History  Procedure Laterality Date  . Angioplasty  5/08  and  12/08     stent x3  . Right shoulder surgery    . Cataract left    . Vitrectomy left eye    . Eye surgery    . Foot tendon surgery Left Sept. 20, 2016     Left Calf / foot -   Dr. Sharol Given    Allergies  Allergen Reactions  . Fluoride Preparations Other (See Comments)    A test for eye to make pictures, (  causing pressure of Left arm )    Current Outpatient Prescriptions  Medication Sig Dispense Refill  . amLODipine (NORVASC) 2.5 MG tablet TAKE 1 TABLET BY MOUTH EVERY DAY 90 tablet 0  . aspirin 81 MG tablet Take 1 tablet (81 mg total) by mouth daily.    . calcium citrate (CALCITRATE - DOSED  IN MG ELEMENTAL CALCIUM) 950 MG tablet Take 200 mg of elemental calcium by mouth daily.    . cholecalciferol (VITAMIN D) 1000 UNITS tablet Take 1,000 Units by mouth daily.    . clopidogrel (PLAVIX) 75 MG tablet TAKE 1 TABLET BY MOUTH EVERY DAY 90 tablet 3  . cycloSPORINE (RESTASIS) 0.05 % ophthalmic emulsion Place 1 drop into both eyes daily as needed (for dry eyes).    . fish oil-omega-3 fatty acids 1000 MG capsule Take 1 g by mouth 2 (two) times daily.    . fluticasone (FLONASE) 50 MCG/ACT nasal spray Place 1 spray into both nostrils as needed for allergies.   2  . Multiple Vitamin (MULTIVITAMIN WITH MINERALS) TABS Take 1 tablet by mouth daily.    . nitroGLYCERIN (NITROSTAT) 0.4 MG SL tablet Place 0.4 mg under the tongue every 5 (five) minutes as needed for chest pain.    . polyethylene glycol powder (GLYCOLAX/MIRALAX) powder Take 255 g by mouth daily. 238 g 0  . Probiotic  Product (PROBIOTIC DAILY PO) Take 1 capsule by mouth daily.    . Psyllium (METAMUCIL) 30.9 % POWD Take by mouth 1 dose over 24 hours.      . rosuvastatin (CRESTOR) 40 MG tablet TAKE 1 TABLET (40 MG TOTAL) BY MOUTH EVERY OTHER DAY. 30 tablet 4  . vitamin B-12 (CYANOCOBALAMIN) 1000 MCG tablet Take 1,000 mcg by mouth daily.    . vitamin C (ASCORBIC ACID) 500 MG tablet Take 500 mg by mouth daily.     No current facility-administered medications for this visit.    Review of Systems : See HPI for pertinent positives and negatives.  Physical Examination  Filed Vitals:   04/10/15 1339 04/10/15 1342  BP: 132/79 102/72  Pulse: 69 68  Temp:  97.7 F (36.5 C)  TempSrc:  Oral  Resp:  16  Height:  5\' 3"  (1.6 m)  Weight:  169 lb (76.658 kg)  SpO2:  98%   Body mass index is 29.94 kg/(m^2).  General: WDWN female in NAD GAIT: normal Eyes: PERRLA Pulmonary: Non-labored, CTAB, no rales, no rhonchi, & no wheezing.  Cardiac: regular rhythm, no detected murmur.  VASCULAR EXAM Carotid Bruits Right Left   Negative Negative   Aorta is not palpable. Radial pulses are 2+ palpable and equal.      LE Pulses Right Left   POPLITEAL not palpable  not palpable   POSTERIOR TIBIAL not palpable  not palpable    DORSALIS PEDIS  ANTERIOR TIBIAL faintly palpable  faintly palpable     Gastrointestinal: soft, nontender, BS WNL, no r/g, no palpable masses.  Musculoskeletal: Negative muscle atrophy/wasting. M/S 5/5 throughout, Extremities without ischemic changes.  Neurologic: A&O X 3; Appropriate Affect, Speech is normal CN 2-12 intact, Pain and light touch intact in extremities, Motor exam as listed above.        Non-Invasive Vascular Imaging CAROTID DUPLEX 04/10/2015   CEREBROVASCULAR DUPLEX EVALUATION    INDICATION:  Carotid stenosis    PREVIOUS INTERVENTION(S): NA    DUPLEX EXAM:     RIGHT  LEFT  Peak Systolic Velocities (cm/s) End Diastolic Velocities (cm/s) Plaque LOCATION Peak Systolic Velocities (cm/s) End Diastolic Velocities (cm/s) Plaque  89 18  CCA PROXIMAL 100 33   119 24  CCA MID 79 17   76 23 CP CCA DISTAL 60 17 HT  108 19 CP ECA 86 13 HT  184 66 CP ICA PROXIMAL 70 20 HT  173 53  ICA MID 69 25  74 18  ICA DISTAL 59 22     2.42 ICA / CCA Ratio (PSV) 0.89  Antegrade Vertebral Flow Antegrade  NA Brachial Systolic Pressure (mmHg) NA  NA Brachial Artery Waveforms NA    Plaque Morphology:  HM = Homogeneous, HT = Heterogeneous, CP = Calcific Plaque, SP = Smooth Plaque, IP = Irregular Plaque     ADDITIONAL FINDINGS: Right subclavian artery PSV 162cm/sec; Left subclavian artery PSV191cm/sec    IMPRESSION: Right internal carotid artery stenosis present in the 60%-79% range, may be underestimated due to calcific plaque making Doppler interrogation difficult. Left internal carotid artery stenosis present in the less than 40% range.    Compared to the previous exam:  Stable and unchanged since studies performed on 08/18/2013, and 02/25/2014.      Assessment: JAKIA KENNEBREW is a 74 y.o. female who has no history of stroke or TIA. Today's carotid duplex suggests 60-79% right internal carotid artery stenosis and less than 40% left ICA stenosis.  Stable and unchanged since studies performed on 08/18/2013, and 02/25/2014.  Fortunately she does not have DM and has never used tobacco.    Plan: Follow-up in 6 months with Carotid Duplex scan.   I discussed in depth with the patient the nature of atherosclerosis, and emphasized the importance of maximal medical management including strict control of blood pressure, blood glucose, and lipid levels, obtaining regular exercise, and continued cessation of smoking.  The patient is aware that without maximal medical management the underlying  atherosclerotic disease process will progress, limiting the benefit of any interventions. The patient was given information about stroke prevention and what symptoms should prompt the patient to seek immediate medical care. Thank you for allowing [place] to participate in this patient's care.  Clemon Chambers, RN, MSN, FNP-C [facility] Office: [facility] Physician: Trula Slade  04/10/2015 1:56 PM

## 2015-04-11 NOTE — Addendum Note (Signed)
Addended by: Mena Goes on: 04/11/2015 05:30 PM   Modules accepted: Orders

## 2015-04-14 ENCOUNTER — Other Ambulatory Visit: Payer: Self-pay | Admitting: Cardiovascular Disease

## 2015-04-21 ENCOUNTER — Encounter (HOSPITAL_COMMUNITY): Payer: Medicare Other

## 2015-04-21 ENCOUNTER — Ambulatory Visit: Payer: Medicare Other | Admitting: Family

## 2015-08-30 ENCOUNTER — Other Ambulatory Visit: Payer: Self-pay

## 2015-08-30 ENCOUNTER — Other Ambulatory Visit: Payer: Self-pay | Admitting: Cardiovascular Disease

## 2015-08-30 MED ORDER — CLOPIDOGREL BISULFATE 75 MG PO TABS: 75.0000 mg | ORAL_TABLET | Freq: Every day | ORAL | Status: DC

## 2015-10-10 ENCOUNTER — Encounter: Payer: Self-pay | Admitting: Family

## 2015-10-13 ENCOUNTER — Ambulatory Visit (HOSPITAL_COMMUNITY)
Admission: RE | Admit: 2015-10-13 | Discharge: 2015-10-13 | Disposition: A | Discharge status: Home / Self Care | Payer: Medicare Other | Source: Ambulatory Visit | Attending: Family | Admitting: Family

## 2015-10-13 ENCOUNTER — Encounter: Payer: Self-pay | Admitting: Family

## 2015-10-13 ENCOUNTER — Encounter (HOSPITAL_COMMUNITY): Payer: Medicare Other

## 2015-10-13 ENCOUNTER — Ambulatory Visit: Payer: Medicare Other | Admitting: Family

## 2015-10-13 ENCOUNTER — Ambulatory Visit (INDEPENDENT_AMBULATORY_CARE_PROVIDER_SITE_OTHER): Payer: Medicare Other | Admitting: Family

## 2015-10-13 VITALS — BP 145/85 | HR 70 | Temp 98.2°F | Resp 16 | Ht 63.0 in | Wt 167.6 lb

## 2015-10-13 DIAGNOSIS — I2510 Atherosclerotic heart disease of native coronary artery without angina pectoris: Secondary | ICD-10-CM | POA: Diagnosis not present

## 2015-10-13 DIAGNOSIS — E785 Hyperlipidemia, unspecified: Secondary | ICD-10-CM | POA: Diagnosis not present

## 2015-10-13 DIAGNOSIS — I10 Essential (primary) hypertension: Secondary | ICD-10-CM | POA: Insufficient documentation

## 2015-10-13 DIAGNOSIS — I6523 Occlusion and stenosis of bilateral carotid arteries: Secondary | ICD-10-CM | POA: Insufficient documentation

## 2015-10-13 DIAGNOSIS — I6521 Occlusion and stenosis of right carotid artery: Secondary | ICD-10-CM

## 2015-10-13 NOTE — Patient Instructions (Signed)
Stroke Prevention Some medical conditions and behaviors are associated with an increased chance of having a stroke. You may prevent a stroke by making healthy choices and managing medical conditions. HOW CAN I REDUCE MY RISK OF HAVING A STROKE?   Stay physically active. Get at least 30 minutes of activity on most or all days.  Do not smoke. It may also be helpful to avoid exposure to secondhand smoke.  Limit alcohol use. Moderate alcohol use is considered to be:  No more than 2 drinks per day for men.  No more than 1 drink per day for nonpregnant women.  Eat healthy foods. This involves:  Eating 5 or more servings of fruits and vegetables a day.  Making dietary changes that address high blood pressure (hypertension), high cholesterol, diabetes, or obesity.  Manage your cholesterol levels.  Making food choices that are high in fiber and low in saturated fat, trans fat, and cholesterol may control cholesterol levels.  Take any prescribed medicines to control cholesterol as directed by your health care provider.  Manage your diabetes.  Controlling your carbohydrate and sugar intake is recommended to manage diabetes.  Take any prescribed medicines to control diabetes as directed by your health care provider.  Control your hypertension.  Making food choices that are low in salt (sodium), saturated fat, trans fat, and cholesterol is recommended to manage hypertension.  Ask your health care provider if you need treatment to lower your blood pressure. Take any prescribed medicines to control hypertension as directed by your health care provider.  If you are 18-39 years of age, have your blood pressure checked every 3-5 years. If you are 40 years of age or older, have your blood pressure checked every year.  Maintain a healthy weight.  Reducing calorie intake and making food choices that are low in sodium, saturated fat, trans fat, and cholesterol are recommended to manage  weight.  Stop drug abuse.  Avoid taking birth control pills.  Talk to your health care provider about the risks of taking birth control pills if you are over 35 years old, smoke, get migraines, or have ever had a blood clot.  Get evaluated for sleep disorders (sleep apnea).  Talk to your health care provider about getting a sleep evaluation if you snore a lot or have excessive sleepiness.  Take medicines only as directed by your health care provider.  For some people, aspirin or blood thinners (anticoagulants) are helpful in reducing the risk of forming abnormal blood clots that can lead to stroke. If you have the irregular heart rhythm of atrial fibrillation, you should be on a blood thinner unless there is a good reason you cannot take them.  Understand all your medicine instructions.  Make sure that other conditions (such as anemia or atherosclerosis) are addressed. SEEK IMMEDIATE MEDICAL CARE IF:   You have sudden weakness or numbness of the face, arm, or leg, especially on one side of the body.  Your face or eyelid droops to one side.  You have sudden confusion.  You have trouble speaking (aphasia) or understanding.  You have sudden trouble seeing in one or both eyes.  You have sudden trouble walking.  You have dizziness.  You have a loss of balance or coordination.  You have a sudden, severe headache with no known cause.  You have new chest pain or an irregular heartbeat. Any of these symptoms may represent a serious problem that is an emergency. Do not wait to see if the symptoms will   go away. Get medical help at once. Call your local emergency services (911 in [place] Do not drive yourself to the hospital.   This information is not intended to replace advice given to you by your health care provider. Make sure you discuss any questions you have with your health care provider.   Document Released: 07/11/2004 Document Revised: 06/24/2014 Document Reviewed:  12/04/2012 Elsevier Interactive Patient Education 2016 Elsevier Inc.  

## 2015-10-13 NOTE — Progress Notes (Signed)
Chief Complaint: Follow up Extracranial Carotid Artery Stenosis   History of Present Illness  [name] is a 75 y.o. female  patient of Dr. 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 Previous carotid studies demonstrated: RICA [id number] stenosis, LICA [id number] stenosis. Patient has not had previous carotid artery intervention.  The patient denies any history of TIA or stroke symptoms, specifically the patient denies a history of amaurosis fugax or monocular blindness, denies a history unilateral of facial drooping, denies a history of hemiplegia, and denies a history of receptive or expressive aphasia.   She has significant family hx for strokes and MI's.  Pt has 3 cardiac stents, denies any hx of MI, but apparently had angina. She denies claudication symptoms with walking, walks 30 minutes daily plus her daily activities.  The patient denies New Medical or Surgical History.  Pt Diabetic: No Pt smoker: non-smoker  Pt meds include: Statin : Yes ASA: Yes Other anticoagulants/antiplatelets: Plavix    Past Medical History  Diagnosis Date  . History of recurrent UTIs   . Hyperlipidemia   . Diverticulosis of colon   . CAD (coronary artery disease)     a. DES to LAD, diagonal 10/2006. b. DES to RCA 05/2007. c. Last cath 11/2012: patent stents, stable dz. d. Carlton Adam cardiolite negative 12/2012.  Marland Kitchen Hx of adenomatous colonic polyps     tubular  . Peripheral vascular disease ([place])   . Hemorrhoids   . Cataract     left eye  . Sleep apnea     wears CPAP  . Hypertension   . Diverticulitis     Social History Social History  Substance Use Topics  . Smoking status: Never Smoker   . Smokeless tobacco: Never Used  . Alcohol Use: 1.0 - 1.5 oz/week    2-3 Standard drinks or equivalent per week    Family History Family History  Problem Relation Age of Onset  . Colon polyps Brother   . Heart disease Brother     before age 63  .  Cancer Brother   . Hyperlipidemia Brother   . Heart attack Brother   . Colon polyps Paternal Grandfather   . Heart disease Father     before age 21  . Deep vein thrombosis Father   . Hyperlipidemia Father   . Heart attack Father 79  . Heart disease Brother   . Stroke Mother     TIA's  . Heart disease Other     Grandmother   . Colon cancer Neg Hx   . Esophageal cancer Neg Hx   . Stomach cancer Neg Hx   . Rectal cancer Neg Hx     Surgical History Past Surgical History  Procedure Laterality Date  . Angioplasty  5/08  and  12/08     stent x3  . Right shoulder surgery    . Cataract left    . Vitrectomy left eye    . Eye surgery    . Foot tendon surgery Left Sept. 20, 2016     Left Calf / foot -   Dr. Sharol Given    Allergies  Allergen Reactions  . Fluoride Preparations Other (See Comments)    A test for eye to make pictures, (  causing pressure of Left arm )    Current Outpatient Prescriptions  Medication Sig Dispense Refill  . amLODipine (NORVASC) 2.5 MG tablet TAKE 1 TABLET BY MOUTH EVERY DAY 90 tablet 3  . aspirin 81 MG  tablet Take 1 tablet (81 mg total) by mouth daily.    . calcium citrate (CALCITRATE - DOSED IN MG ELEMENTAL CALCIUM) 950 MG tablet Take 200 mg of elemental calcium by mouth daily.    . cholecalciferol (VITAMIN D) 1000 UNITS tablet Take 1,000 Units by mouth daily.    . clopidogrel (PLAVIX) 75 MG tablet Take 1 tablet (75 mg total) by mouth daily. 90 tablet 0  . fish oil-omega-3 fatty acids 1000 MG capsule Take 1 g by mouth 2 (two) times daily.    . fluticasone (FLONASE) 50 MCG/ACT nasal spray Place 1 spray into both nostrils as needed for allergies.   2  . Multiple Vitamin (MULTIVITAMIN WITH MINERALS) TABS Take 1 tablet by mouth daily.    . nitroGLYCERIN (NITROSTAT) 0.4 MG SL tablet Place 0.4 mg under the tongue every 5 (five) minutes as needed for chest pain.    . Probiotic Product (PROBIOTIC DAILY PO) Take 1 capsule by mouth daily.    . Psyllium (METAMUCIL)  30.9 % POWD Take by mouth 1 dose over 24 hours.      . rosuvastatin (CRESTOR) 40 MG tablet TAKE 1 TABLET (40 MG TOTAL) BY MOUTH EVERY OTHER DAY. 30 tablet 4  . vitamin B-12 (CYANOCOBALAMIN) 1000 MCG tablet Take 1,000 mcg by mouth daily.    . vitamin C (ASCORBIC ACID) 500 MG tablet Take 500 mg by mouth daily.    . cycloSPORINE (RESTASIS) 0.05 % ophthalmic emulsion Place 1 drop into both eyes daily as needed (for dry eyes). Reported on 10/13/2015    . polyethylene glycol powder (GLYCOLAX/MIRALAX) powder Take 255 g by mouth daily. (Patient not taking: Reported on 10/13/2015) 238 g 0   No current facility-administered medications for this visit.    Review of Systems : See HPI for pertinent positives and negatives.  Physical Examination  Filed Vitals:   10/13/15 1224  BP: 145/85  Pulse: 70  Temp: 98.2 F (36.8 C)  TempSrc: Oral  Resp: 16  Height: 5\' 3"  (1.6 m)  Weight: 167 lb 9.6 oz (76.023 kg)  SpO2: 96%   Body mass index is 29.7 kg/(m^2).  General: WDWN female in NAD GAIT: normal Eyes: PERRLA Pulmonary: Respirations are non-labored, CTAB  Cardiac: regular rhythm, no detected murmur.  VASCULAR EXAM Carotid Bruits Right Left   Negative Negative   Aorta is not palpable. Radial pulses are 2+ palpable and equal.      LE Pulses Right Left   POPLITEAL not palpable  not palpable   POSTERIOR TIBIAL not palpable  not palpable    DORSALIS PEDIS  ANTERIOR TIBIAL faintly palpable  faintly palpable     Gastrointestinal: soft, nontender, BS WNL, no r/g, no palpable masses.  Musculoskeletal: No muscle atrophy/wasting. M/S 5/5 throughout, Extremities without ischemic changes.  Neurologic: A&O X 3; Appropriate Affect, Speech is normal CN 2-12 intact, Pain and light touch intact in extremities,  Motor exam as listed above.               Non-Invasive Vascular Imaging CAROTID DUPLEX 10/13/2015   CEREBROVASCULAR DUPLEX EVALUATION    INDICATION: Carotid artery disease    PREVIOUS INTERVENTION(S): None    DUPLEX EXAM: Carotid duplex    RIGHT  LEFT  Peak Systolic Velocities (cm/s) End Diastolic Velocities (cm/s) Plaque LOCATION Peak Systolic Velocities (cm/s) End Diastolic Velocities (cm/s) Plaque  155 20  CCA PROXIMAL 75 18   87 26  CCA MID 74 23   79 27 HT CCA DISTAL 69 21  84 17  ECA 66 11 HT  199 92 HT ICA PROXIMAL 67 22 HT  155 55 HT ICA MID 76 27 HT  133 40  ICA DISTAL 68 26     2.2 ICA / CCA Ratio (PSV) .90  Antegrade Vertebral Flow Antegrade  - Brachial Systolic Pressure (mmHg) -  Triphasic Brachial Artery Waveforms Triphasic    Plaque Morphology:  HM = Homogeneous, HT = Heterogeneous, CP = Calcific Plaque, SP = Smooth Plaque, IP = Irregular Plaque     ADDITIONAL FINDINGS: Multiphasic subclavian arteries, right 138 cm/s, left 121 cm/s    IMPRESSION: 1. 60 - 79% right internal carotid artery stenosis 2. Less than 40% left internal carotid artery stenosis    Compared to the previous exam:  No change since exam of 04/10/2015      Assessment: [name] is a 75 y.o. female who has no history of stroke or TIA. Today's carotid duplex suggests 60-79% right internal carotid artery stenosis and less than 40% left ICA stenosis.  Stable and unchanged since studies performed on 08/18/2013, 2014/03/20, and 04/10/2015.  Fortunately she does not have DM and has never used tobacco.  She exercises regularly.    Plan: Follow-up in 6 months with Carotid Duplex scan.   I discussed in depth with the patient the nature of atherosclerosis, and emphasized the importance of maximal medical management including strict control of blood pressure, blood glucose, and lipid levels, obtaining regular exercise, and continued cessation of smoking.  The patient is aware  that without maximal medical management the underlying atherosclerotic disease process will progress, limiting the benefit of any interventions. The patient was given information about stroke prevention and what symptoms should prompt the patient to seek immediate medical care. Thank you for allowing [place] to participate in this patient's care.  Clemon Chambers, RN, MSN, FNP-C [facility] Office: [phone]  Clinic Physician: Scot Dock  10/13/2015 12:32 PM

## 2015-10-20 ENCOUNTER — Ambulatory Visit (INDEPENDENT_AMBULATORY_CARE_PROVIDER_SITE_OTHER): Payer: Medicare Other | Admitting: Cardiovascular Disease

## 2015-10-20 ENCOUNTER — Encounter: Payer: Self-pay | Admitting: Cardiovascular Disease

## 2015-10-20 VITALS — BP 130/76 | HR 66 | Ht 63.0 in | Wt 168.0 lb

## 2015-10-20 DIAGNOSIS — I10 Essential (primary) hypertension: Secondary | ICD-10-CM | POA: Diagnosis not present

## 2015-10-20 DIAGNOSIS — E785 Hyperlipidemia, unspecified: Secondary | ICD-10-CM

## 2015-10-20 DIAGNOSIS — I6523 Occlusion and stenosis of bilateral carotid arteries: Secondary | ICD-10-CM | POA: Diagnosis not present

## 2015-10-20 DIAGNOSIS — I2510 Atherosclerotic heart disease of native coronary artery without angina pectoris: Secondary | ICD-10-CM | POA: Diagnosis not present

## 2015-10-20 NOTE — Patient Instructions (Signed)

## 2015-10-20 NOTE — Progress Notes (Signed)
Chief Complaint  Patient presents with  . Follow-up      History of Present Illness: 75 yo WF with h/o CAD with previous drug eluting stents mid LAD, diagonal and proximal/mid RCA as well as HTN, hyperlipidemia, obstructive sleep apnea, carotid artery disease here today for cardiac follow up. Her cardiac history includes drug eluting stents LAD and Diagonal May 2008, followed by DES RCA in December 2008. Stress test February 2011 in [facility] with no ischemia, normal LVEF at 88%. At her initial visit in our office on 04/22/11, she had c/o left arm pain and her prior anginal equivalent was left arm pain. I arranged a treadmill stress test on 04/24/11. She exercised for 6 minutes and 34 seconds. There were no ischemic EKG changes. She did have tingling in her arm with exercise but no chest pain. I saw her June 2013 and she c/o recurrence of pain in her left arm with exertion associated with fatigue and SOB. Cardiac cath in June 2013 with stable disease, patent stents LAD, Diagonal, RCA. Admitted July 2014 with left shoulder pain. Stress myoview 12/28/12 with no ischemia. She had some left sided weakness in March 2015 and was seen in [facility] and was found to have moderate carotid artery disease. She has been followed in the [facility] office.   She is here today for follow up. She has had no chest pain or SOB. She has been walking 30 minutes every day.   Primary Care Physician: Shon Baton  Past Medical History  Diagnosis Date  . History of recurrent UTIs   . Hyperlipidemia   . Diverticulosis of colon   . CAD (coronary artery disease)     a. DES to LAD, diagonal 10/2006. b. DES to RCA 05/2007. c. Last cath 11/2012: patent stents, stable dz. d. Carlton Adam cardiolite negative 12/2012.  Marland Kitchen Hx of adenomatous colonic polyps     tubular  . Peripheral vascular disease ([place])   . Hemorrhoids   . Cataract     left eye  . Sleep apnea     wears CPAP  . Hypertension   . Diverticulitis     Past Surgical  History  Procedure Laterality Date  . Angioplasty  5/08  and  12/08     stent x3  . Right shoulder surgery    . Cataract left    . Vitrectomy left eye    . Eye surgery    . Foot tendon surgery Left Sept. 20, 2016     Left Calf / foot -   Dr. Sharol Given    Current Outpatient Prescriptions  Medication Sig Dispense Refill  . amLODipine (NORVASC) 2.5 MG tablet TAKE 1 TABLET BY MOUTH EVERY DAY 90 tablet 3  . aspirin 81 MG tablet Take 1 tablet (81 mg total) by mouth daily.    . calcium citrate (CALCITRATE - DOSED IN MG ELEMENTAL CALCIUM) 950 MG tablet Take 200 mg of elemental calcium by mouth daily.    . cholecalciferol (VITAMIN D) 1000 UNITS tablet Take 1,000 Units by mouth daily.    . clopidogrel (PLAVIX) 75 MG tablet Take 1 tablet (75 mg total) by mouth daily. 90 tablet 0  . fish oil-omega-3 fatty acids 1000 MG capsule Take 1 g by mouth 2 (two) times daily.    . fluticasone (FLONASE) 50 MCG/ACT nasal spray Place 1 spray into both nostrils as needed for allergies.   2  . Multiple Vitamin (MULTIVITAMIN WITH MINERALS) TABS Take 1 tablet by mouth daily.    Marland Kitchen  nitroGLYCERIN (NITROSTAT) 0.4 MG SL tablet Place 0.4 mg under the tongue every 5 (five) minutes as needed for chest pain.    . Probiotic Product (PROBIOTIC DAILY PO) Take 1 capsule by mouth daily.    . Psyllium (METAMUCIL) 30.9 % POWD Take by mouth 1 dose over 24 hours.      . rosuvastatin (CRESTOR) 40 MG tablet TAKE 1 TABLET (40 MG TOTAL) BY MOUTH EVERY OTHER DAY. 30 tablet 4  . vitamin B-12 (CYANOCOBALAMIN) 1000 MCG tablet Take 1,000 mcg by mouth daily.    . vitamin C (ASCORBIC ACID) 500 MG tablet Take 500 mg by mouth daily.     No current facility-administered medications for this visit.    Allergies  Allergen Reactions  . Fluoride Preparations Other (See Comments)    A test for eye to make pictures, (  causing pressure of Left arm )    Social History   Social History  . Marital Status: Married    Spouse Name: N/A  . Number of  Children: 0  . Years of Education: N/A   Occupational History  . retired     Social History Main Topics  . Smoking status: Never Smoker   . Smokeless tobacco: Never Used  . Alcohol Use: 1.0 - 1.5 oz/week    2-3 Standard drinks or equivalent per week  . Drug Use: No  . Sexual Activity: Not on file   Other Topics Concern  . Not on file   Social History Narrative    Family History  Problem Relation Age of Onset  . Colon polyps Brother   . Heart disease Brother     before age 38  . Cancer Brother   . Hyperlipidemia Brother   . Heart attack Brother   . Colon polyps Paternal Grandfather   . Heart disease Father     before age 77  . Deep vein thrombosis Father   . Hyperlipidemia Father   . Heart attack Father 32  . Heart disease Brother   . Stroke Mother     TIA's  . Heart disease Other     Grandmother   . Colon cancer Neg Hx   . Esophageal cancer Neg Hx   . Stomach cancer Neg Hx   . Rectal cancer Neg Hx     Review of Systems:  As stated in the HPI and otherwise negative.   BP 130/76 mmHg  Pulse 66  Ht 5\' 3"  (1.6 m)  Wt 168 lb (76.204 kg)  BMI 29.77 kg/m2  SpO2 97%  Physical Examination: General: Well developed, well nourished, NAD HEENT: OP clear, mucus membranes moist SKIN: warm, dry. No rashes. Neuro: No focal deficits Musculoskeletal: Muscle strength 5/5 all ext Psychiatric: Mood and affect normal Neck: No JVD, no carotid bruits, no thyromegaly, no lymphadenopathy. Lungs:Clear bilaterally, no wheezes, rhonci, crackles Cardiovascular: Regular rate and rhythm. No murmurs, gallops or rubs. Abdomen:Soft. Bowel sounds present. Non-tender.  Extremities: No lower extremity edema. Pulses are 2 + in the bilateral DP/PT.  Cardiac cath 11/27/11:  Left main:  Left Anterior Descending Artery: Large caliber vessel that courses to the apex. There is a patent stent in the proximal to mid LAD with minimal, 20% in stent restenosis. The diagonal branch arises just before  the LAD stent. There is a patent stent in the ostium and proximal segment of the diagonal branch with perhaps 20% ostial stenosis.  Circumflex Artery: Moderate sized vessel with no disease noted. Small early OM branch with no disease.  Right Coronary Artery: Large, dominant vessel with patent stent in the proximal vessel. Just before the stent, there is a 20% plaque. Just beyond the stent, there is a 30% plaque.  Left Ventricular Angiogram: LVEF 65-70%.   EKG:  EKG is ordered today. The ekg ordered today demonstrates NSR, rate 69 bpm. Poor R wave progression precordial leads.   Recent Labs: No results found for requested labs within last 365 days.   Lipid Panel Followed in primary care   Wt Readings from Last 3 Encounters:  10/20/15 168 lb (76.204 kg)  10/13/15 167 lb 9.6 oz (76.023 kg)  04/10/15 169 lb (76.658 kg)     Other studies Reviewed: Additional studies/ records that were reviewed today include: . Review of the above records demonstrates:    Assessment and Plan:   1. CAD: She has no chest pain suggestive of angina. Cardiac cath June 2013 with stable disease. Continue medical management with ASA, Plavix and statin.    2. HTN: BP is well controlled. No changes today.   3. HYPERLIPIDEMIA: Lipids well controlled on statin per pt. Followed in primary care. No changes today.   4. Carotid artery disease: Followed in [facility]. Last carotid dopplers April 2017 stable.   Current medicines are reviewed at length with the patient today.  The patient does not have concerns regarding medicines.  The following changes have been made:  no change  Labs/ tests ordered today include:  No orders of the defined types were placed in this encounter.    Disposition:   FU with me in 6 months  Signed, Lauree Chandler, MD 10/20/2015 1:50 PM    [facility] [place], [place], [place]  [address] Phone: [phone]; Fax: [phone]

## 2015-11-07 ENCOUNTER — Other Ambulatory Visit: Payer: Self-pay | Admitting: Cardiovascular Disease

## 2015-11-07 DIAGNOSIS — Z0000 Encounter for general adult medical examination without abnormal findings: Secondary | ICD-10-CM | POA: Insufficient documentation

## 2015-11-08 NOTE — Telephone Encounter (Signed)
Please advise on request as there is not a recent lipid panel in epic and per last office visit, lipids are followed by pcp. Thanks, MI

## 2015-11-09 NOTE — Telephone Encounter (Signed)
OK to refill

## 2015-11-16 ENCOUNTER — Other Ambulatory Visit: Payer: Self-pay | Admitting: *Deleted

## 2015-11-16 DIAGNOSIS — I6523 Occlusion and stenosis of bilateral carotid arteries: Secondary | ICD-10-CM

## 2015-11-24 ENCOUNTER — Other Ambulatory Visit: Payer: Self-pay | Admitting: Cardiovascular Disease

## 2015-12-25 ENCOUNTER — Other Ambulatory Visit: Payer: Self-pay | Admitting: Obstetrics and Gynecology

## 2015-12-25 DIAGNOSIS — Z1231 Encounter for screening mammogram for malignant neoplasm of breast: Secondary | ICD-10-CM

## 2016-01-17 ENCOUNTER — Ambulatory Visit
Admission: RE | Admit: 2016-01-17 | Discharge: 2016-01-17 | Disposition: A | Discharge status: Home / Self Care | Payer: Medicare Other | Source: Ambulatory Visit | Attending: Obstetrics and Gynecology | Admitting: Obstetrics and Gynecology

## 2016-01-17 DIAGNOSIS — Z1231 Encounter for screening mammogram for malignant neoplasm of breast: Secondary | ICD-10-CM

## 2016-03-05 DIAGNOSIS — M791 Myalgia, unspecified site: Secondary | ICD-10-CM | POA: Insufficient documentation

## 2016-03-12 ENCOUNTER — Other Ambulatory Visit: Payer: Self-pay | Admitting: *Deleted

## 2016-03-12 MED ORDER — ROSUVASTATIN CALCIUM 40 MG PO TABS: 40.0000 mg | ORAL_TABLET | ORAL | 2 refills | Status: DC

## 2016-04-10 ENCOUNTER — Other Ambulatory Visit: Payer: Self-pay | Admitting: Cardiovascular Disease

## 2016-04-17 ENCOUNTER — Encounter: Payer: Self-pay | Admitting: Family

## 2016-04-19 ENCOUNTER — Ambulatory Visit (INDEPENDENT_AMBULATORY_CARE_PROVIDER_SITE_OTHER): Payer: Medicare Other | Admitting: Family

## 2016-04-19 ENCOUNTER — Encounter: Payer: Self-pay | Admitting: Family

## 2016-04-19 ENCOUNTER — Ambulatory Visit (HOSPITAL_COMMUNITY)
Admission: RE | Admit: 2016-04-19 | Discharge: 2016-04-19 | Disposition: A | Discharge status: Home / Self Care | Payer: Medicare Other | Source: Ambulatory Visit | Attending: Vascular Surgery | Admitting: Vascular Surgery

## 2016-04-19 VITALS — BP 142/79 | HR 67 | Temp 97.2°F | Ht 63.0 in | Wt 165.3 lb

## 2016-04-19 DIAGNOSIS — I6521 Occlusion and stenosis of right carotid artery: Secondary | ICD-10-CM

## 2016-04-19 DIAGNOSIS — I6523 Occlusion and stenosis of bilateral carotid arteries: Secondary | ICD-10-CM | POA: Insufficient documentation

## 2016-04-19 LAB — VAS US CAROTID
LEFT ECA DIAS: -14 cm/s
LEFT VERTEBRAL DIAS: 12 cm/s
Left CCA dist dias: 20 cm/s
Left CCA dist sys: 64 cm/s
Left CCA prox dias: 17 cm/s
Left CCA prox sys: 70 cm/s
Left ICA dist dias: -29 cm/s
Left ICA dist sys: -78 cm/s
Left ICA prox dias: 21 cm/s
Left ICA prox sys: 66 cm/s
RIGHT CCA MID DIAS: 20 cm/s
RIGHT ECA DIAS: -15 cm/s
RIGHT VERTEBRAL DIAS: 11 cm/s
Right CCA prox dias: 21 cm/s
Right CCA prox sys: 91 cm/s
Right cca dist sys: -122 cm/s

## 2016-04-19 NOTE — Progress Notes (Signed)
Chief Complaint: Follow up Extracranial Carotid Artery Stenosis   History of Present Illness  [name] is a 75 y.o. female patient of Dr. 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 Previous carotid studies demonstrated: RICA [id number] stenosis, LICA [id number] stenosis. She has not had previous carotid artery intervention.  She denies any history of TIA or stroke symptoms, specifically she denies a history of amaurosis fugax or monocular blindness, unilateralfacial drooping, hemiplegia, or receptive or expressive aphasia.   She has significant family hx for strokes and MI's.  Pt has 3 cardiac stents, denies any hx of MI, but apparently had angina. She denies claudication symptoms with walking, walks 30 minutes daily plus her daily activities.  The patient denies New Medical or Surgical History.  Pt Diabetic: No Pt smoker: non-smoker  Pt meds include: Statin : Yes, takes 3 days/week, if takes daily she has myalgias  ASA: Yes Other anticoagulants/antiplatelets: Plavix   Past Medical History:  Diagnosis Date  . CAD (coronary artery disease)    a. DES to LAD, diagonal 10/2006. b. DES to RCA 05/2007. c. Last cath 11/2012: patent stents, stable dz. d. Carlton Adam cardiolite negative 12/2012.  . Cataract    left eye  . Diverticulitis   . Diverticulosis of colon   . Hemorrhoids   . History of recurrent UTIs   . Hx of adenomatous colonic polyps    tubular  . Hyperlipidemia   . Hypertension   . Peripheral vascular disease ([place])   . Sleep apnea    wears CPAP    Social History Social History  Substance Use Topics  . Smoking status: Never Smoker  . Smokeless tobacco: Never Used  . Alcohol use 1.0 - 1.5 oz/week    2 - 3 Standard drinks or equivalent per week    Family History Family History  Problem Relation Age of Onset  . Colon polyps Brother   . Heart disease Brother     before age 65  . Cancer Brother   .  Hyperlipidemia Brother   . Heart attack Brother   . Heart disease Father     before age 42  . Deep vein thrombosis Father   . Hyperlipidemia Father   . Heart attack Father 65  . Heart disease Brother   . Stroke Mother     TIA's  . Colon polyps Paternal Grandfather   . Heart disease Other     Grandmother   . Colon cancer Neg Hx   . Esophageal cancer Neg Hx   . Stomach cancer Neg Hx   . Rectal cancer Neg Hx     Surgical History Past Surgical History:  Procedure Laterality Date  . ANGIOPLASTY  5/08  and  12/08    stent x3  . Cataract left    . EYE SURGERY    . FOOT TENDON SURGERY Left Sept. 20, 2016    Left Calf / foot -   Dr. Sharol Given  . Right shoulder surgery    . Vitrectomy left eye      Allergies  Allergen Reactions  . Fluoride Preparations Other (See Comments)    A test for eye to make pictures, (  causing pressure of Left arm )    Current Outpatient Prescriptions  Medication Sig Dispense Refill  . amLODipine (NORVASC) 2.5 MG tablet TAKE 1 TABLET BY MOUTH EVERY DAY 90 tablet 1  . aspirin 81 MG tablet Take 1 tablet (81 mg total) by mouth daily.    Marland Kitchen  cholecalciferol (VITAMIN D) 1000 UNITS tablet Take 1,000 Units by mouth daily.    . clopidogrel (PLAVIX) 75 MG tablet TAKE 1 TABLET BY MOUTH DAILY. 90 tablet 3  . fish oil-omega-3 fatty acids 1000 MG capsule Take 1 g by mouth 2 (two) times daily.    . Multiple Vitamin (MULTIVITAMIN WITH MINERALS) TABS Take 1 tablet by mouth daily.    . Probiotic Product (PROBIOTIC DAILY PO) Take 1 capsule by mouth daily.    . Psyllium (METAMUCIL) 30.9 % POWD Take by mouth 1 dose over 24 hours.      . rosuvastatin (CRESTOR) 40 MG tablet Take 1 tablet (40 mg total) by mouth every other day. 45 tablet 2  . vitamin B-12 (CYANOCOBALAMIN) 1000 MCG tablet Take 1,000 mcg by mouth daily.    . vitamin C (ASCORBIC ACID) 500 MG tablet Take 500 mg by mouth daily.    . fluticasone (FLONASE) 50 MCG/ACT nasal spray Place 1 spray into both nostrils as  needed for allergies.   2  . nitroGLYCERIN (NITROSTAT) 0.4 MG SL tablet Place 0.4 mg under the tongue every 5 (five) minutes as needed for chest pain.     No current facility-administered medications for this visit.     Review of Systems : See HPI for pertinent positives and negatives.  Physical Examination  Vitals:   04/19/16 1322  BP: (!) 142/79  Pulse: 67  Temp: 97.2 F (36.2 C)  TempSrc: Oral  SpO2: 98%  Weight: 165 lb 4.8 oz (75 kg)  Height: 5\' 3"  (1.6 m)   Body mass index is 29.28 kg/m.  General: WDWN female in NAD GAIT: normal Eyes: PERRLA Pulmonary: Respirations are non-labored, CTAB  Cardiac: regular rhythm, no detected murmur.  VASCULAR EXAM Carotid Bruits Right Left   Negative Negative   Aorta is not palpable. Radial pulses are 2+ palpable and equal.      LE Pulses Right Left   POPLITEAL not palpable  not palpable   POSTERIOR TIBIAL not palpable  not palpable    DORSALIS PEDIS  ANTERIOR TIBIAL faintly palpable  faintly palpable     Gastrointestinal: soft, nontender, BS WNL, no r/g, no palpable masses.  Musculoskeletal: No muscle atrophy/wasting. M/S 5/5 throughout, Extremities without ischemic changes.  Neurologic: A&O X 3; Appropriate Affect, Speech is normal CN 2-12 intact, Pain and light touch intact in extremities, Motor exam as listed above.     Assessment: [name] is a 75 y.o. female who has no history of stroke or TIA. Fortunately she does not have DM and has never used tobacco. She does have a significant family history of CVD events.  She exercises regularly.    DATA Today's carotid duplex suggests 60-79% right internal carotid artery stenosis and less than 40% left ICA stenosis.  Bilateral vertebral arteries are antegrade.   Bilateral subclavian arteries have multiphasic waveforms.  Stable and unchanged since studies performed on 08/18/2013, 03/16/14, 04/10/2015, and 10/13/15.    Plan: Follow-up in 6 months with Carotid Duplex scan.   I discussed in depth with the patient the nature of atherosclerosis, and emphasized the importance of maximal medical management including strict control of blood pressure, blood glucose, and lipid levels, obtaining regular exercise, and continued cessation of smoking.  The patient is aware that without maximal medical management the underlying atherosclerotic disease process will progress, limiting the benefit of any interventions. The patient was given information about stroke prevention and what symptoms should prompt the patient to seek immediate medical care.  Thank you for allowing [place] to participate in this patient's care.  Clemon Chambers, RN, MSN, FNP-C [facility] Office: [phone]  Clinic Physician: Bridgett Larsson  04/19/16 1:28 PM

## 2016-04-19 NOTE — Patient Instructions (Signed)
Stroke Prevention Some medical conditions and behaviors are associated with an increased chance of having a stroke. You may prevent a stroke by making healthy choices and managing medical conditions. HOW CAN I REDUCE MY RISK OF HAVING A STROKE?   Stay physically active. Get at least 30 minutes of activity on most or all days.  Do not smoke. It may also be helpful to avoid exposure to secondhand smoke.  Limit alcohol use. Moderate alcohol use is considered to be:  No more than 2 drinks per day for men.  No more than 1 drink per day for nonpregnant women.  Eat healthy foods. This involves:  Eating 5 or more servings of fruits and vegetables a day.  Making dietary changes that address high blood pressure (hypertension), high cholesterol, diabetes, or obesity.  Manage your cholesterol levels.  Making food choices that are high in fiber and low in saturated fat, trans fat, and cholesterol may control cholesterol levels.  Take any prescribed medicines to control cholesterol as directed by your health care provider.  Manage your diabetes.  Controlling your carbohydrate and sugar intake is recommended to manage diabetes.  Take any prescribed medicines to control diabetes as directed by your health care provider.  Control your hypertension.  Making food choices that are low in salt (sodium), saturated fat, trans fat, and cholesterol is recommended to manage hypertension.  Ask your health care provider if you need treatment to lower your blood pressure. Take any prescribed medicines to control hypertension as directed by your health care provider.  If you are 18-39 years of age, have your blood pressure checked every 3-5 years. If you are 40 years of age or older, have your blood pressure checked every year.  Maintain a healthy weight.  Reducing calorie intake and making food choices that are low in sodium, saturated fat, trans fat, and cholesterol are recommended to manage  weight.  Stop drug abuse.  Avoid taking birth control pills.  Talk to your health care provider about the risks of taking birth control pills if you are over 35 years old, smoke, get migraines, or have ever had a blood clot.  Get evaluated for sleep disorders (sleep apnea).  Talk to your health care provider about getting a sleep evaluation if you snore a lot or have excessive sleepiness.  Take medicines only as directed by your health care provider.  For some people, aspirin or blood thinners (anticoagulants) are helpful in reducing the risk of forming abnormal blood clots that can lead to stroke. If you have the irregular heart rhythm of atrial fibrillation, you should be on a blood thinner unless there is a good reason you cannot take them.  Understand all your medicine instructions.  Make sure that other conditions (such as anemia or atherosclerosis) are addressed. SEEK IMMEDIATE MEDICAL CARE IF:   You have sudden weakness or numbness of the face, arm, or leg, especially on one side of the body.  Your face or eyelid droops to one side.  You have sudden confusion.  You have trouble speaking (aphasia) or understanding.  You have sudden trouble seeing in one or both eyes.  You have sudden trouble walking.  You have dizziness.  You have a loss of balance or coordination.  You have a sudden, severe headache with no known cause.  You have new chest pain or an irregular heartbeat. Any of these symptoms may represent a serious problem that is an emergency. Do not wait to see if the symptoms will   go away. Get medical help at once. Call your local emergency services (911 in [place] Do not drive yourself to the hospital.   This information is not intended to replace advice given to you by your health care provider. Make sure you discuss any questions you have with your health care provider.   Document Released: 07/11/2004 Document Revised: 06/24/2014 Document Reviewed:  12/04/2012 Elsevier Interactive Patient Education 2016 Elsevier Inc.  

## 2016-05-07 NOTE — Addendum Note (Signed)
Addended by: Lianne Cure A on: 05/07/2016 09:33 AM   Modules accepted: Orders

## 2016-08-16 ENCOUNTER — Ambulatory Visit: Payer: Medicare Other | Admitting: Cardiovascular Disease

## 2016-10-06 ENCOUNTER — Other Ambulatory Visit: Payer: Self-pay | Admitting: Cardiovascular Disease

## 2016-10-08 NOTE — Progress Notes (Signed)
Chief Complaint  Patient presents with  . Follow-up      History of Present Illness: 76 yo female with history of CAD with previous stenting of the mid LAD, Diagonal and Proximal and mid RCA, HTN, HLD, OSA, carotid artery disease here today for follow up. Her cardiac history includes drug eluting stents placed in the LAD and Diagonal in May 2008, followed by DES placement in the RCA in December 2008. At her initial visit in our office November 2012, she had c/o left arm pain and her prior anginal equivalent was left arm pain. Stress test in November 2012 with no ischemia. She did have tingling in her arm with exercise but no chest pain. I saw her June 2013 and she c/o recurrence of pain in her left arm with exertion associated with fatigue and SOB. Cardiac cath June 2013 with stable disease, patent stents LAD, Diagonal, RCA. Admitted July 2014 with left shoulder pain. Stress myoview 12/28/12 with no ischemia. She had some left sided weakness in March 2015 and was seen in [facility] and was found to have moderate carotid artery disease. She has been followed in the [facility] office for her carotid disease.   She is here today for follow up. The patient denies any chest pain, dyspnea, palpitations, lower extremity edema, orthopnea, PND, dizziness, near syncope or syncope.     Primary Care Physician: Precious Reel, MD   Past Medical History:  Diagnosis Date  . CAD (coronary artery disease)    a. DES to LAD, diagonal 10/2006. b. DES to RCA 05/2007. c. Last cath 11/2012: patent stents, stable dz. d. Carlton Adam cardiolite negative 12/2012.  . Cataract    left eye  . Diverticulitis   . Diverticulosis of colon   . Hemorrhoids   . History of recurrent UTIs   . Hx of adenomatous colonic polyps    tubular  . Hyperlipidemia   . Hypertension   . Peripheral vascular disease ([place])   . Sleep apnea    wears CPAP    Past Surgical History:  Procedure Laterality Date  . ANGIOPLASTY  5/08  and  12/08    stent x3  .  Cataract left    . EYE SURGERY    . FOOT TENDON SURGERY Left Sept. 20, 2016    Left Calf / foot -   Dr. Sharol Given  . Right shoulder surgery    . Vitrectomy left eye      Current Outpatient Prescriptions  Medication Sig Dispense Refill  . amLODipine (NORVASC) 2.5 MG tablet TAKE 1 TABLET BY MOUTH EVERY DAY 90 tablet 0  . aspirin 81 MG tablet Take 1 tablet (81 mg total) by mouth daily.    . cholecalciferol (VITAMIN D) 1000 UNITS tablet Take 1,000 Units by mouth daily.    . clopidogrel (PLAVIX) 75 MG tablet TAKE 1 TABLET BY MOUTH DAILY. 90 tablet 3  . fish oil-omega-3 fatty acids 1000 MG capsule Take 1 g by mouth 2 (two) times daily.    . fluticasone (FLONASE) 50 MCG/ACT nasal spray Place 1 spray into both nostrils as needed for allergies.   2  . Multiple Vitamin (MULTIVITAMIN WITH MINERALS) TABS Take 1 tablet by mouth daily.    . nitroGLYCERIN (NITROSTAT) 0.4 MG SL tablet Place 0.4 mg under the tongue every 5 (five) minutes as needed for chest pain.    Marland Kitchen Psyllium (METAMUCIL) 30.9 % POWD Take by mouth 1 dose over 24 hours.      . rosuvastatin (CRESTOR) 40  MG tablet Take 1 tablet (40 mg total) by mouth every other day. 45 tablet 2  . vitamin B-12 (CYANOCOBALAMIN) 1000 MCG tablet Take 1,000 mcg by mouth daily.    . vitamin C (ASCORBIC ACID) 500 MG tablet Take 500 mg by mouth daily.     No current facility-administered medications for this visit.     Allergies  Allergen Reactions  . Fluoride Preparations Other (See Comments)    A test for eye to make pictures, (  causing pressure of Left arm )    Social History   Social History  . Marital status: Married    Spouse name: N/A  . Number of children: 0  . Years of education: N/A   Occupational History  . retired  Retired   Social History Main Topics  . Smoking status: Never Smoker  . Smokeless tobacco: Never Used  . Alcohol use 1.0 - 1.5 oz/week    2 - 3 Standard drinks or equivalent per week  . Drug use: No  . Sexual activity: Not  on file   Other Topics Concern  . Not on file   Social History Narrative  . No narrative on file    Family History  Problem Relation Age of Onset  . Colon polyps Brother   . Heart disease Brother     before age 44  . Cancer Brother   . Hyperlipidemia Brother   . Heart attack Brother   . Heart disease Father     before age 80  . Deep vein thrombosis Father   . Hyperlipidemia Father   . Heart attack Father 69  . Heart disease Brother   . Stroke Mother     TIA's  . Colon polyps Paternal Grandfather   . Heart disease Other     Grandmother   . Colon cancer Neg Hx   . Esophageal cancer Neg Hx   . Stomach cancer Neg Hx   . Rectal cancer Neg Hx     Review of Systems:  As stated in the HPI and otherwise negative.   BP 130/62   Pulse 64   Ht 5\' 3"  (1.6 m)   Wt 164 lb 12.8 oz (74.8 kg)   SpO2 98%   BMI 29.19 kg/m   Physical Examination: General: Well developed, well nourished, NAD  HEENT: OP clear, mucus membranes moist  SKIN: warm, dry. No rashes. Neuro: No focal deficits  Musculoskeletal: Muscle strength 5/5 all ext  Psychiatric: Mood and affect normal  Neck: No JVD, no carotid bruits, no thyromegaly, no lymphadenopathy.  Lungs:Clear bilaterally, no wheezes, rhonci, crackles Cardiovascular: Regular rate and rhythm. No murmurs, gallops or rubs. Abdomen:Soft. Bowel sounds present. Non-tender.  Extremities: No lower extremity edema. Pulses are 2 + in the bilateral DP/PT.   Cardiac cath 11/27/11:  Left main:  Left Anterior Descending Artery: Large caliber vessel that courses to the apex. There is a patent stent in the proximal to mid LAD with minimal, 20% in stent restenosis. The diagonal branch arises just before the LAD stent. There is a patent stent in the ostium and proximal segment of the diagonal branch with perhaps 20% ostial stenosis.  Circumflex Artery: Moderate sized vessel with no disease noted. Small early OM branch with no disease.  Right Coronary Artery:  Large, dominant vessel with patent stent in the proximal vessel. Just before the stent, there is a 20% plaque. Just beyond the stent, there is a 30% plaque.  Left Ventricular Angiogram: LVEF 65-70%.  EKG:  EKG is ordered today. The ekg ordered today demonstrates NSR, rate 64 bpm  Recent Labs: No results found for requested labs within last 8760 hours.   Lipid Panel Followed in primary care   Wt Readings from Last 3 Encounters:  10/09/16 164 lb 12.8 oz (74.8 kg)  04/19/16 165 lb 4.8 oz (75 kg)  10/20/15 168 lb (76.2 kg)     Other studies Reviewed: Additional studies/ records that were reviewed today include: . Review of the above records demonstrates:    Assessment and Plan:   1. CAD without angina: She has no chest pain suggestive of angina. Last cath in June 2013 with stable CAD. Will continue ASA, Plavix, statin..    2. HTN: BP controlled. No changes.    3. HYPERLIPIDEMIA: Lipids followed in primary care and well controlled per pt. Continue statin.    4. Carotid artery disease: Followed in [facility].  Current medicines are reviewed at length with the patient today.  The patient does not have concerns regarding medicines.  The following changes have been made:  no change  Labs/ tests ordered today include:  No orders of the defined types were placed in this encounter.   Disposition:   FU with me in 12  months  Signed, Lauree Chandler, MD 10/09/2016 9:10 AM    [facility] [place], [place], [place]  [address] Phone: [phone]; Fax: [phone]

## 2016-10-09 ENCOUNTER — Encounter: Payer: Self-pay | Admitting: Cardiovascular Disease

## 2016-10-09 ENCOUNTER — Ambulatory Visit (INDEPENDENT_AMBULATORY_CARE_PROVIDER_SITE_OTHER): Payer: Medicare Other | Admitting: Cardiovascular Disease

## 2016-10-09 VITALS — BP 130/62 | HR 64 | Ht 63.0 in | Wt 164.8 lb

## 2016-10-09 DIAGNOSIS — E7800 Pure hypercholesterolemia, unspecified: Secondary | ICD-10-CM | POA: Diagnosis not present

## 2016-10-09 DIAGNOSIS — I10 Essential (primary) hypertension: Secondary | ICD-10-CM

## 2016-10-09 DIAGNOSIS — I2510 Atherosclerotic heart disease of native coronary artery without angina pectoris: Secondary | ICD-10-CM | POA: Diagnosis not present

## 2016-10-09 DIAGNOSIS — I6523 Occlusion and stenosis of bilateral carotid arteries: Secondary | ICD-10-CM | POA: Diagnosis not present

## 2016-10-09 MED ORDER — NITROGLYCERIN 0.4 MG SL SUBL: 0.4000 mg | SUBLINGUAL_TABLET | SUBLINGUAL | 5 refills | Status: DC | PRN

## 2016-10-09 NOTE — Patient Instructions (Signed)
Your physician recommends that you continue on your current medications as directed. Please refer to the Current Medication list given to you today.  Your physician wants you to follow-up in: YEAR WITH DR MCALHANY  You will receive a reminder letter in the mail two months in advance. If you don't receive a letter, please call our office to schedule the follow-up appointment. 

## 2016-10-17 ENCOUNTER — Ambulatory Visit: Payer: Medicare Other | Admitting: Family

## 2016-10-17 ENCOUNTER — Encounter (HOSPITAL_COMMUNITY): Payer: Medicare Other

## 2016-10-23 ENCOUNTER — Encounter: Payer: Self-pay | Admitting: Family

## 2016-10-28 ENCOUNTER — Encounter: Payer: Self-pay | Admitting: Family

## 2016-10-28 ENCOUNTER — Ambulatory Visit (INDEPENDENT_AMBULATORY_CARE_PROVIDER_SITE_OTHER): Payer: Medicare Other | Admitting: Family

## 2016-10-28 ENCOUNTER — Ambulatory Visit (HOSPITAL_COMMUNITY)
Admission: RE | Admit: 2016-10-28 | Discharge: 2016-10-28 | Disposition: A | Discharge status: Home / Self Care | Payer: Medicare Other | Source: Ambulatory Visit | Attending: Family | Admitting: Family

## 2016-10-28 VITALS — BP 116/77 | HR 76 | Temp 97.2°F | Resp 18 | Ht 63.0 in | Wt 166.0 lb

## 2016-10-28 DIAGNOSIS — I6521 Occlusion and stenosis of right carotid artery: Secondary | ICD-10-CM

## 2016-10-28 DIAGNOSIS — I6523 Occlusion and stenosis of bilateral carotid arteries: Secondary | ICD-10-CM

## 2016-10-28 LAB — VAS US CAROTID
LEFT ECA DIAS: -12 cm/s
LEFT VERTEBRAL DIAS: -10 cm/s
Left CCA dist dias: 24 cm/s
Left CCA dist sys: 78 cm/s
Left CCA prox dias: 25 cm/s
Left CCA prox sys: 106 cm/s
Left ICA dist dias: -23 cm/s
Left ICA dist sys: -67 cm/s
Left ICA prox dias: -20 cm/s
Left ICA prox sys: -64 cm/s
RIGHT CCA MID DIAS: 21 cm/s
RIGHT ECA DIAS: -16 cm/s
RIGHT VERTEBRAL DIAS: -20 cm/s
Right CCA prox dias: 24 cm/s
Right CCA prox sys: 119 cm/s
Right cca dist sys: -172 cm/s

## 2016-10-28 NOTE — Progress Notes (Signed)
Chief Complaint: Follow up Extracranial Carotid Artery Stenosis   History of Present Illness  [name] is a 76 y.o. female patient of Dr. 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 Previous carotid studies demonstrated: RICA 57-84% stenosis, LICA <69% stenosis. She has not had previous carotid artery intervention.  She denies any history of TIA or stroke symptoms, specifically she denies a history of amaurosis fugax or monocular blindness, unilateralfacial drooping, hemiplegia, or receptive or expressive aphasia.   She has been treated by Dr.Rankin for a bleeding issue in her left eye, seems to have stabilized, has had procedure on this eye to address this.   She has significant family hx for strokes and MI's.  Pt has 3 cardiac stents, denies any hx of MI, but apparently had angina. She denies claudication symptoms with walking, walks 30 minutes daily plus her daily activities.  Pt Diabetic: No Pt smoker: non-smoker  Pt meds include: Statin : Yes, takes 3 days/week, if takes daily she has myalgias  ASA: Yes Other anticoagulants/antiplatelets: Plavix   Past Medical History:  Diagnosis Date  . CAD (coronary artery disease)    a. DES to LAD, diagonal 10/2006. b. DES to RCA 05/2007. c. Last cath 11/2012: patent stents, stable dz. d. Carlton Adam cardiolite negative 12/2012.  . Cataract    left eye  . Diverticulitis   . Diverticulosis of colon   . Hemorrhoids   . History of recurrent UTIs   . Hx of adenomatous colonic polyps    tubular  . Hyperlipidemia   . Hypertension   . Peripheral vascular disease ([place])   . Sleep apnea    wears CPAP    Social History Social History  Substance Use Topics  . Smoking status: Never Smoker  . Smokeless tobacco: Never Used  . Alcohol use 1.0 - 1.5 oz/week    2 - 3 Standard drinks or equivalent per week    Family History Family History  Problem Relation Age of Onset  . Colon  polyps Brother   . Heart disease Brother        before age 42  . Cancer Brother   . Hyperlipidemia Brother   . Heart attack Brother   . Heart disease Father        before age 74  . Deep vein thrombosis Father   . Hyperlipidemia Father   . Heart attack Father 93  . Heart disease Brother   . Stroke Mother        TIA's  . Colon polyps Paternal Grandfather   . Heart disease Other        Grandmother   . Colon cancer Neg Hx   . Esophageal cancer Neg Hx   . Stomach cancer Neg Hx   . Rectal cancer Neg Hx     Surgical History Past Surgical History:  Procedure Laterality Date  . ANGIOPLASTY  5/08  and  12/08    stent x3  . Cataract left    . EYE SURGERY    . FOOT TENDON SURGERY Left Sept. 20, 2016    Left Calf / foot -   Dr. Sharol Given  . Right shoulder surgery    . Vitrectomy left eye      Allergies  Allergen Reactions  . Fluoride Preparations Other (See Comments)    A test for eye to make pictures, (  causing pressure of Left arm )    Current Outpatient Prescriptions  Medication Sig Dispense Refill  .  amLODipine (NORVASC) 2.5 MG tablet TAKE 1 TABLET BY MOUTH EVERY DAY 90 tablet 0  . aspirin 81 MG tablet Take 1 tablet (81 mg total) by mouth daily.    . cholecalciferol (VITAMIN D) 1000 UNITS tablet Take 1,000 Units by mouth daily.    . clopidogrel (PLAVIX) 75 MG tablet TAKE 1 TABLET BY MOUTH DAILY. 90 tablet 3  . fish oil-omega-3 fatty acids 1000 MG capsule Take 1 g by mouth 2 (two) times daily.    . fluticasone (FLONASE) 50 MCG/ACT nasal spray Place 1 spray into both nostrils as needed for allergies.   2  . Multiple Vitamin (MULTIVITAMIN WITH MINERALS) TABS Take 1 tablet by mouth daily.    . nitroGLYCERIN (NITROSTAT) 0.4 MG SL tablet Place 1 tablet (0.4 mg total) under the tongue every 5 (five) minutes as needed for chest pain. 25 tablet 5  . Psyllium (METAMUCIL) 30.9 % POWD Take by mouth 1 dose over 24 hours.      . rosuvastatin (CRESTOR) 40 MG tablet Take 1 tablet (40 mg  total) by mouth every other day. 45 tablet 2  . vitamin B-12 (CYANOCOBALAMIN) 1000 MCG tablet Take 1,000 mcg by mouth daily.    . vitamin C (ASCORBIC ACID) 500 MG tablet Take 500 mg by mouth daily.     No current facility-administered medications for this visit.     Review of Systems : See HPI for pertinent positives and negatives.  Physical Examination  Vitals:   10/28/16 1525  BP: 116/77  Pulse: 76  Resp: 18  Temp: 97.2 F (36.2 C)  TempSrc: Oral  SpO2: 95%  Weight: 166 lb (75.3 kg)  Height: 5\' 3"  (1.6 m)   Body mass index is 29.41 kg/m.  General: WDWN female in NAD GAIT:normal Eyes: PERRLA Pulmonary: Respirations are non-labored, CTAB  Cardiac: regular rhythm, no detected murmur.  VASCULAR EXAM Carotid Bruits Right Left   Negative Negative   Aorta is not palpable. Radial pulses are 2+ palpable and equal.      LE Pulses Right Left   POPLITEAL not palpable  not palpable   POSTERIOR TIBIAL 2+ palpable  2+ palpable    DORSALIS PEDIS  ANTERIOR TIBIAL Not palpable  not palpable     Gastrointestinal: soft, nontender, BS WNL, no r/g, no palpable masses.  Musculoskeletal: No muscle atrophy/wasting. M/S 5/5 throughout, Extremities without ischemic changes.  Neurologic: A&O X 3; Appropriate Affect, Speech is normal CN 2-12 intact, Pain and light touch intact in extremities, Motor exam as listed above.     Assessment: [name] is a 75 y.o. female who has no history of stroke or TIA.  Fortunately she does not have DM and has never used tobacco. She does have a significant family history of CVD events.  She exercises regularly.   She takes a daily ASA and Plavix, takes a statin 3x/week.   DATA Today's carotid duplex suggests 60-79% right internal carotid  artery stenosis and less than 40% left ICA stenosis.  Bilateral vertebral arteries are antegrade.  Bilateral subclavian arteries have multiphasic waveforms.  Stable and unchanged since studies performed on 08/18/2013, 19-Mar-2014, 04/10/2015,  10/13/15, and 04-19-16.     Plan: Follow-up in 9 months with Carotid Duplex scan.    I discussed in depth with the patient the nature of atherosclerosis, and emphasized the importance of maximal medical management including strict control of blood pressure, blood glucose, and lipid levels, obtaining regular exercise, and continued cessation of smoking.  The patient is  aware that without maximal medical management the underlying atherosclerotic disease process will progress, limiting the benefit of any interventions. The patient was given information about stroke prevention and what symptoms should prompt the patient to seek immediate medical care. Thank you for allowing [place] to participate in this patient's care.  Clemon Chambers, RN, MSN, FNP-C [facility] Office: [facility] Physician: Trula Slade  10/28/16 3:29 PM

## 2016-10-28 NOTE — Patient Instructions (Addendum)
Stroke Prevention Some medical conditions and behaviors are associated with an increased chance of having a stroke. You may prevent a stroke by making healthy choices and managing medical conditions. How can I reduce my risk of having a stroke?  Stay physically active. Get at least 30 minutes of activity on most or all days.  Do not smoke. It may also be helpful to avoid exposure to secondhand smoke.  Limit alcohol use. Moderate alcohol use is considered to be:  No more than 2 drinks per day for men.  No more than 1 drink per day for nonpregnant women.  Eat healthy foods. This involves:  Eating 5 or more servings of fruits and vegetables a day.  Making dietary changes that address high blood pressure (hypertension), high cholesterol, diabetes, or obesity.  Manage your cholesterol levels.  Making food choices that are high in fiber and low in saturated fat, trans fat, and cholesterol may control cholesterol levels.  Take any prescribed medicines to control cholesterol as directed by your health care provider.  Manage your diabetes.  Controlling your carbohydrate and sugar intake is recommended to manage diabetes.  Take any prescribed medicines to control diabetes as directed by your health care provider.  Control your hypertension.  Making food choices that are low in salt (sodium), saturated fat, trans fat, and cholesterol is recommended to manage hypertension.  Ask your health care provider if you need treatment to lower your blood pressure. Take any prescribed medicines to control hypertension as directed by your health care provider.  If you are 18-39 years of age, have your blood pressure checked every 3-5 years. If you are 40 years of age or older, have your blood pressure checked every year.  Maintain a healthy weight.  Reducing calorie intake and making food choices that are low in sodium, saturated fat, trans fat, and cholesterol are recommended to manage  weight.  Stop drug abuse.  Avoid taking birth control pills.  Talk to your health care provider about the risks of taking birth control pills if you are over 35 years old, smoke, get migraines, or have ever had a blood clot.  Get evaluated for sleep disorders (sleep apnea).  Talk to your health care provider about getting a sleep evaluation if you snore a lot or have excessive sleepiness.  Take medicines only as directed by your health care provider.  For some people, aspirin or blood thinners (anticoagulants) are helpful in reducing the risk of forming abnormal blood clots that can lead to stroke. If you have the irregular heart rhythm of atrial fibrillation, you should be on a blood thinner unless there is a good reason you cannot take them.  Understand all your medicine instructions.  Make sure that other conditions (such as anemia or atherosclerosis) are addressed. Get help right away if:  You have sudden weakness or numbness of the face, arm, or leg, especially on one side of the body.  Your face or eyelid droops to one side.  You have sudden confusion.  You have trouble speaking (aphasia) or understanding.  You have sudden trouble seeing in one or both eyes.  You have sudden trouble walking.  You have dizziness.  You have a loss of balance or coordination.  You have a sudden, severe headache with no known cause.  You have new chest pain or an irregular heartbeat. Any of these symptoms may represent a serious problem that is an emergency. Do not wait to see if the symptoms will go away.   Get medical help at once. Call your local emergency services (911 in [place] Do not drive yourself to the hospital. This information is not intended to replace advice given to you by your health care provider. Make sure you discuss any questions you have with your health care provider. Document Released: 07/11/2004 Document Revised: 11/09/2015 Document Reviewed: 12/04/2012 Elsevier  Interactive Patient Education  2017 Elsevier Inc.      Preventing Cerebrovascular Disease Arteries are blood vessels that carry blood that contains oxygen from the heart to all parts of the body. Cerebrovascular disease affects arteries that supply the brain. Any condition that blocks or disrupts blood flow to the brain can cause cerebrovascular disease. Brain cells that lose blood supply start to die within minutes (stroke). Stroke is the main danger of cerebrovascular disease. Atherosclerosis and high blood pressure are common causes of cerebrovascular disease. Atherosclerosis is narrowing and hardening of an artery that results when fat, cholesterol, calcium, or other substances (plaque) build up inside an artery. Plaque reduces blood flow through the artery. High blood pressure increases the risk of bleeding inside the brain. Making diet and lifestyle changes to prevent atherosclerosis and high blood pressure lowers your risk of cerebrovascular disease. What nutrition changes can be made?  Eat more fruits, vegetables, and whole grains.  Reduce how much saturated fat you eat. To do this, eat less red meat and fewer full-fat dairy products.  Eat healthy proteins instead of red meat. Healthy proteins include:  Fish. Eat fish that contains heart-healthy omega-3 fatty acids, twice a week. Examples include salmon, albacore tuna, mackerel, and herring.  Chicken.  Nuts.  Low-fat or nonfat yogurt.  Avoid processed meats, like bacon and lunchmeat.  Avoid foods that contain:  A lot of sugar, such as sweets and drinks with added sugar.  A lot of salt (sodium). Avoid adding extra salt to your food, as told by your health care provider.  Trans fats, such as margarine and baked goods. Trans fats may be listed as "partially hydrogenated oils" on food labels.  Check food labels to see how much sodium, sugar, and trans fats are in foods.  Use vegetable oils that contain low amounts of  saturated fat, such as olive oil or canola oil. What lifestyle changes can be made?  Drink alcohol in moderation. This means no more than 1 drink a day for nonpregnant women and 2 drinks a day for men. One drink equals 12 oz of beer, 5 oz of wine, or 1 oz of hard liquor.  If you are overweight, ask your health care provider to recommend a weight-loss plan for you. Losing 5-10 lb (2.2-4.5 kg) can reduce your risk of diabetes, atherosclerosis, and high blood pressure.  Exercise for 30?60 minutes on most days, or as much as told by your health care provider.  Do moderate-intensity exercise, such as brisk walking, bicycling, and water aerobics. Ask your health care provider which activities are safe for you.  Do not use any products that contain nicotine or tobacco, such as cigarettes and e-cigarettes. If you need help quitting, ask your health care provider. Why are these changes important? Making these changes lowers your risk of many diseases that can cause cerebrovascular disease and stroke. Stroke is a leading cause of death and disability. Making these changes also improves your overall health and quality of life. What can I do to lower my risk? The following factors make you more likely to develop cerebrovascular disease:  Being overweight.  Smoking.  Being physically   inactive.  Eating a high-fat diet.  Having certain health conditions, such as:  Diabetes.  High blood pressure.  Heart disease.  Atherosclerosis.  High cholesterol.  Sickle cell disease. Talk with your health care provider about your risk for cerebrovascular disease. Work with your health care provider to control diseases that you have that may contribute to cerebrovascular disease. Your health care provider may prescribe medicines to help prevent major causes of cerebrovascular disease. Where to find more information: Learn more about preventing cerebrovascular disease from:  National Heart, Lung, and  Blood Institute: www.nhlbi.nih.gov/health/health-topics/topics/stroke  Centers for Disease Control and Prevention: cdc.gov/stroke/about.htm Summary  Cerebrovascular disease can lead to a stroke.  Atherosclerosis and high blood pressure are major causes of cerebrovascular disease.  Making diet and lifestyle changes can reduce your risk of cerebrovascular disease.  Work with your health care provider to get your risk factors under control to reduce your risk of cerebrovascular disease. This information is not intended to replace advice given to you by your health care provider. Make sure you discuss any questions you have with your health care provider. Document Released: 06/18/2015 Document Revised: 12/22/2015 Document Reviewed: 06/18/2015 Elsevier Interactive Patient Education  2017 Elsevier Inc.  

## 2016-11-01 NOTE — Addendum Note (Signed)
Addended by: Lianne Cure A on: 11/01/2016 10:05 AM   Modules accepted: Orders

## 2016-12-03 ENCOUNTER — Other Ambulatory Visit: Payer: Self-pay | Admitting: *Deleted

## 2016-12-03 MED ORDER — CLOPIDOGREL BISULFATE 75 MG PO TABS: 75.0000 mg | ORAL_TABLET | Freq: Every day | ORAL | 2 refills | Status: DC

## 2016-12-12 ENCOUNTER — Other Ambulatory Visit: Payer: Self-pay | Admitting: Obstetrics and Gynecology

## 2016-12-12 DIAGNOSIS — Z1231 Encounter for screening mammogram for malignant neoplasm of breast: Secondary | ICD-10-CM

## 2017-01-07 ENCOUNTER — Other Ambulatory Visit: Payer: Self-pay | Admitting: Cardiovascular Disease

## 2017-01-14 ENCOUNTER — Other Ambulatory Visit: Payer: Self-pay | Admitting: Cardiovascular Disease

## 2017-01-14 MED ORDER — AMLODIPINE BESYLATE 2.5 MG PO TABS: 2.5000 mg | ORAL_TABLET | Freq: Every day | ORAL | 2 refills | Status: DC

## 2017-01-21 ENCOUNTER — Ambulatory Visit
Admission: RE | Admit: 2017-01-21 | Discharge: 2017-01-21 | Disposition: A | Discharge status: Home / Self Care | Payer: Medicare Other | Source: Ambulatory Visit | Attending: Obstetrics and Gynecology | Admitting: Obstetrics and Gynecology

## 2017-01-21 DIAGNOSIS — Z1231 Encounter for screening mammogram for malignant neoplasm of breast: Secondary | ICD-10-CM

## 2017-02-13 ENCOUNTER — Emergency Department
Admission: EM | Admit: 2017-02-13 | Discharge: 2017-02-13 | Disposition: A | Discharge status: Home / Self Care | Payer: Medicare Other | Attending: Emergency Medicine | Admitting: Emergency Medicine

## 2017-02-13 DIAGNOSIS — T7840XA Allergy, unspecified, initial encounter: Secondary | ICD-10-CM

## 2017-02-13 DIAGNOSIS — Z7902 Long term (current) use of antithrombotics/antiplatelets: Secondary | ICD-10-CM | POA: Diagnosis not present

## 2017-02-13 DIAGNOSIS — Z7982 Long term (current) use of aspirin: Secondary | ICD-10-CM | POA: Diagnosis not present

## 2017-02-13 DIAGNOSIS — I2510 Atherosclerotic heart disease of native coronary artery without angina pectoris: Secondary | ICD-10-CM | POA: Diagnosis not present

## 2017-02-13 DIAGNOSIS — I10 Essential (primary) hypertension: Secondary | ICD-10-CM | POA: Insufficient documentation

## 2017-02-13 DIAGNOSIS — R21 Rash and other nonspecific skin eruption: Secondary | ICD-10-CM | POA: Diagnosis present

## 2017-02-13 DIAGNOSIS — Z79899 Other long term (current) drug therapy: Secondary | ICD-10-CM | POA: Insufficient documentation

## 2017-02-13 MED ORDER — HYDROXYZINE HCL 25 MG PO TABS: 25.0000 mg | ORAL_TABLET | Freq: Three times a day (TID) | ORAL | 0 refills | Status: DC | PRN

## 2017-02-13 NOTE — ED Triage Notes (Signed)
Pt arrived via EMS from [facility] on [address] a possible allergic reaction and rash. EMS stated that around 9:15pm pt reported that swallowing became a little difficult, she was itching on the chest and under arms, she was flushed and red. Pt reported that she had a previous episode around 10 years ago. Pt stated that she was having dinner before this happened. Pt took Benadryl 50mg . EMS reported that all VS were WNL, there were no signs of Hypotension, clear lung sounds, and no obvious angioedema in the throat or mouth. Pt reports taking Amilodipine for the past 10 years. Pt reports "feeling better" but has a dry mouth and tongue.

## 2017-02-13 NOTE — ED Provider Notes (Signed)
[facility] Emergency Department Provider Note   ____________________________________________   First MD Initiated Contact with Patient 02/13/17 2229     (approximate)  I have reviewed the triage vital signs and the nursing notes.   HISTORY  Chief Complaint Rash    HPI [name] is a 76 y.o. female patient arrived via [facility] possible allergic reaction and rash. Patient stated around 9:15 PM today she started having  mild difficulty with swallowing. Patient states she started itching on the chest and under both arms. Patient states she was feeling flush. Patient states she went to [facility] and Benadryl Benadryl but at that time she started feeling faint. EMS was notified and brought patient to the ED.By the time the patient arrived to the ED she states she was "feeling better" but had a dry mouth and tongue. Patient is able to tolerate ice chips. Patient state the itching sensation has resolved. Patient has similar episode 10 years ago. During that time there were more frequent and she had multiple tests all negative findings. Patient denies any new food, medication, or fluids today.   Past Medical History:  Diagnosis Date  . CAD (coronary artery disease)    a. DES to LAD, diagonal 10/2006. b. DES to RCA 05/2007. c. Last cath 11/2012: patent stents, stable dz. d. Carlton Adam cardiolite negative 12/2012.  . Cataract    left eye  . Diverticulitis   . Diverticulosis of colon   . Hemorrhoids   . History of recurrent UTIs   . Hx of adenomatous colonic polyps    tubular  . Hyperlipidemia   . Hypertension   . Peripheral vascular disease ([place])   . Sleep apnea    wears CPAP    Patient Active Problem List   Diagnosis Date Noted  . 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 HTN (hypertension) 11/25/2011  . RECTAL BLEEDING 03/16/2010  . HYPERLIPIDEMIA 02/15/2008  . CAD 02/15/2008  . DIVERTICULOSIS OF  COLON 02/15/2008  . UTI'S, HX OF 02/15/2008  . COLONIC POLYPS, ADENOMATOUS, HX OF 02/12/2008    Past Surgical History:  Procedure Laterality Date  . ANGIOPLASTY  5/08  and  12/08    stent x3  . Cataract left    . EYE SURGERY    . FOOT TENDON SURGERY Left Sept. 20, 2016    Left Calf / foot -   Dr. Sharol Given  . Right shoulder surgery    . Vitrectomy left eye      Prior to Admission medications   Medication Sig Start Date End Date Taking? Authorizing Provider  amLODipine (NORVASC) 2.5 MG tablet Take 1 tablet (2.5 mg total) by mouth daily. 01/14/17   Burnell Blanks, MD  aspirin 81 MG tablet Take 1 tablet (81 mg total) by mouth daily. 12/28/12   Dunn, Nedra Hai, PA-C  cholecalciferol (VITAMIN D) 1000 UNITS tablet Take 1,000 Units by mouth daily.    [provider]  clopidogrel (PLAVIX) 75 MG tablet Take 1 tablet (75 mg total) by mouth daily. 12/03/16   Burnell Blanks, MD  fish oil-omega-3 fatty acids 1000 MG capsule Take 1 g by mouth 2 (two) times daily.    [provider]  fluticasone (FLONASE) 50 MCG/ACT nasal spray Place 1 spray into both nostrils as needed for allergies.  06/02/14   [provider]  hydrOXYzine (ATARAX/VISTARIL) 25 MG tablet Take 1 tablet (25 mg total) by mouth 3 (three) times  daily as needed. 02/13/17   Sable Feil, PA-C  Multiple Vitamin (MULTIVITAMIN WITH MINERALS) TABS Take 1 tablet by mouth daily.    [provider]  nitroGLYCERIN (NITROSTAT) 0.4 MG SL tablet Place 1 tablet (0.4 mg total) under the tongue every 5 (five) minutes as needed for chest pain. 10/09/16   Burnell Blanks, MD  Psyllium (METAMUCIL) 30.9 % POWD Take by mouth 1 dose over 24 hours.      [provider]  rosuvastatin (CRESTOR) 40 MG tablet TAKE 1 TABLET (40 MG TOTAL) BY MOUTH EVERY OTHER DAY. 01/07/17   Burnell Blanks, MD  vitamin B-12 (CYANOCOBALAMIN) 1000 MCG tablet Take 1,000 mcg by mouth daily.    [provider]    vitamin C (ASCORBIC ACID) 500 MG tablet Take 500 mg by mouth daily.    [provider]    Allergies Fluoride preparations  Family History  Problem Relation Age of Onset  . Colon polyps Brother   . Heart disease Brother        before age 30  . Cancer Brother   . Hyperlipidemia Brother   . Heart attack Brother   . Heart disease Father        before age 23  . Deep vein thrombosis Father   . Hyperlipidemia Father   . Heart attack Father 71  . Heart disease Brother   . Stroke Mother        TIA's  . Colon polyps Paternal Grandfather   . Heart disease Other        Grandmother   . Colon cancer Neg Hx   . Esophageal cancer Neg Hx   . Stomach cancer Neg Hx   . Rectal cancer Neg Hx     Social History Social History  Substance Use Topics  . Smoking status: Never Smoker  . Smokeless tobacco: Never Used  . Alcohol use 1.0 - 1.5 oz/week    2 - 3 Standard drinks or equivalent per week    Review of Systems Constitutional: No fever/chills Eyes: No visual changes. ENT: No sore throat. Resolved dysphagia. Cardiovascular: Denies chest pain. Respiratory: Denies shortness of breath. Gastrointestinal: No abdominal pain.  No nausea, no vomiting.  No diarrhea.  No constipation. Genitourinary: Negative for dysuria. Musculoskeletal: Negative for back pain. Skin: Negative for rash. Positive itching Neurological: Negative for headaches, focal weakness or numbness. Endocrine:Hyperlipidemia and hypertension Allergic/Immunilogical: Fluoride ____________________________________________   PHYSICAL EXAM:  VITAL SIGNS: ED Triage Vitals  Enc Vitals Group     BP 02/13/17 2230 (!) 137/124     Pulse Rate 02/13/17 2230 83     Resp 02/13/17 2230 18     Temp 02/13/17 2230 98.4 F (36.9 C)     Temp Source 02/13/17 2230 Oral     SpO2 02/13/17 2230 96 %     Weight 02/13/17 2232 163 lb (73.9 kg)     Height 02/13/17 2232 5\' 3"  (1.6 m)     Head Circumference --      Peak Flow --       Pain Score --      Pain Loc --      Pain Edu? --      Excl. in [place]? --     Constitutional: Alert and oriented. Well appearing and in no acute distress. Mouth/Throat: Mucous membranes are moist.  Oropharynx non-erythematous.No signs of angioedema of the oral cavity Neck: No stridor.  Hematological/Lymphatic/Immunilogical: No cervical lymphadenopathy. Cardiovascular: Normal rate, regular rhythm. Grossly normal  heart sounds.  Good peripheral circulation. Elevated diastolic blood pressure will retake before discharge. Respiratory: Normal respiratory effort.  No retractions. Lungs CTAB. Gastrointestinal: Soft and nontender. No distention. No abdominal bruits. No CVA tenderness. Musculoskeletal: No lower extremity tenderness nor edema.  No joint effusions. Neurologic:  Normal speech and language. No gross focal neurologic deficits are appreciated. No gait instability. Skin:  Skin is warm, dry and intact. No rash noted. Psychiatric: Mood and affect are normal. Speech and behavior are normal.  ____________________________________________   LABS (all labs ordered are listed, but only abnormal results are displayed)  Labs Reviewed - No data to display ____________________________________________  EKG   ____________________________________________  RADIOLOGY  No results found.  ____________________________________________   PROCEDURES  Procedure(s) performed: None  Procedures  Critical Care performed: No  ____________________________________________   INITIAL IMPRESSION / ASSESSMENT AND PLAN / ED COURSE  Pertinent labs & imaging results that were available during my care of the patient were reviewed by me and considered in my medical decision making (see chart for details).  History of present with allergic reaction of unknown etiology. Patient's complaint has resolved prior to examination. Patient had taken Benadryl approximately one hour ago. Blood pressure was retaken  secondary to elevated diastolic readings of 268. Blood pressure is 166/72. Patient states no distress at this time. Patient given discharge care instructions and advised to notify her PCP for ER visit. Patient given a prescription for Atarax 25 mg to take as directed if recurrence of complaint. Advised return back to the ED if she has severe dysphagia or other symptoms of angioedema.      ____________________________________________   FINAL CLINICAL IMPRESSION(S) / ED DIAGNOSES  Final diagnoses:  Allergic reaction, initial encounter      NEW MEDICATIONS STARTED DURING THIS VISIT:  Discharge Medication List as of 02/13/2017 10:58 PM    START taking these medications   Details  hydrOXYzine (ATARAX/VISTARIL) 25 MG tablet Take 1 tablet (25 mg total) by mouth 3 (three) times daily as needed., Starting Thu 02/13/2017, Print         Note:  This document was prepared using Dragon voice recognition software and may include unintentional dictation errors.    Sable Feil, PA-C 02/13/17 [place], [place], MD 02/14/17 [phone]

## 2017-04-14 DIAGNOSIS — K219 Gastro-esophageal reflux disease without esophagitis: Secondary | ICD-10-CM | POA: Insufficient documentation

## 2017-05-23 DIAGNOSIS — L509 Urticaria, unspecified: Secondary | ICD-10-CM | POA: Insufficient documentation

## 2017-08-06 ENCOUNTER — Encounter (HOSPITAL_COMMUNITY): Payer: Medicare Other

## 2017-08-06 ENCOUNTER — Ambulatory Visit: Payer: Medicare Other | Admitting: Family

## 2017-08-08 ENCOUNTER — Encounter (HOSPITAL_COMMUNITY): Payer: Medicare Other

## 2017-08-08 ENCOUNTER — Ambulatory Visit: Payer: Medicare Other | Admitting: Family

## 2017-09-12 ENCOUNTER — Ambulatory Visit (HOSPITAL_COMMUNITY)
Admission: RE | Admit: 2017-09-12 | Discharge: 2017-09-12 | Disposition: A | Discharge status: Home / Self Care | Payer: Medicare Other | Source: Ambulatory Visit | Attending: Family | Admitting: Family

## 2017-09-12 ENCOUNTER — Ambulatory Visit: Payer: Medicare Other | Admitting: Family

## 2017-09-12 ENCOUNTER — Encounter: Payer: Self-pay | Admitting: Family

## 2017-09-12 ENCOUNTER — Other Ambulatory Visit: Payer: Self-pay

## 2017-09-12 VITALS — BP 144/87 | HR 72 | Temp 96.8°F | Resp 16 | Ht 63.0 in | Wt 165.0 lb

## 2017-09-12 DIAGNOSIS — I6521 Occlusion and stenosis of right carotid artery: Secondary | ICD-10-CM | POA: Diagnosis not present

## 2017-09-12 DIAGNOSIS — I6523 Occlusion and stenosis of bilateral carotid arteries: Secondary | ICD-10-CM

## 2017-09-12 NOTE — Progress Notes (Signed)
Chief Complaint: Follow up Extracranial Carotid Artery Stenosis   History of Present Illness  [name] is a 77 y.o. female whom Dr. Bridgett Larsson has been monitoring for extracranial carotid artery stenosis. Previous carotid studies demonstrated: RICA 93-23% stenosis, LICA <55% stenosis. Shehas not had previous carotid artery intervention.  Shedenies any history of TIA or stroke symptoms, specifically she denies a history of amaurosis fugax or monocular blindness, unilateralfacial drooping, hemiplegia, or receptive or expressive aphasia.   She has been treated by Dr.Rankin for a bleeding issue in her left eye, seems to have stabilized, has had procedure on this eye to address this.   She has significant family hx for strokes and MI's.  Pt has 3 cardiac stents, denies any hx of MI, but apparently had angina.  She denies claudication symptoms with walking, walks 30 minutes daily plus her daily activities.  She does have occasional hip pain when walking that has been determined to be from her low back issues.  But she walks 2 miles daily [facility]   Pt Diabetic: No Pt smoker: non-smoker  Pt meds include: Statin : Yes, takes 3 days/week, if takes daily she has myalgias  ASA: Yes Other anticoagulants/antiplatelets: Plavix    Past Medical History:  Diagnosis Date  . CAD (coronary artery disease)    a. DES to LAD, diagonal 10/2006. b. DES to RCA 05/2007. c. Last cath 11/2012: patent stents, stable dz. d. Carlton Adam cardiolite negative 12/2012.  . Cataract    left eye  . Diverticulitis   . Diverticulosis of colon   . Hemorrhoids   . History of recurrent UTIs   . Hx of adenomatous colonic polyps    tubular  . Hyperlipidemia   . Hypertension   . Peripheral vascular disease ([place])   . Sleep apnea    wears CPAP    Social History Social History   Tobacco Use  . Smoking status: Never Smoker  . Smokeless tobacco: Never Used  Substance  Use Topics  . Alcohol use: Yes    Alcohol/week: 1.0 - 1.5 oz    Types: 2 - 3 Standard drinks or equivalent per week  . Drug use: No    Family History Family History  Problem Relation Age of Onset  . Colon polyps Brother   . Heart disease Brother        before age 57  . Cancer Brother   . Hyperlipidemia Brother   . Heart attack Brother   . Heart disease Father        before age 107  . Deep vein thrombosis Father   . Hyperlipidemia Father   . Heart attack Father 86  . Heart disease Brother   . Stroke Mother        TIA's  . Colon polyps Paternal Grandfather   . Heart disease Other        Grandmother   . Colon cancer Neg Hx   . Esophageal cancer Neg Hx   . Stomach cancer Neg Hx   . Rectal cancer Neg Hx     Surgical History Past Surgical History:  Procedure Laterality Date  . ANGIOPLASTY  5/08  and  12/08    stent x3  . Cataract left    . EYE SURGERY    . FOOT TENDON SURGERY Left Sept. 20, 2016    Left Calf / foot -   Dr. Sharol Given  . Right shoulder surgery    . Vitrectomy left  eye      Allergies  Allergen Reactions  . Fluoride Preparations Other (See Comments)    A test for eye to make pictures, (  causing pressure of Left arm )    Current Outpatient Medications  Medication Sig Dispense Refill  . amLODipine (NORVASC) 2.5 MG tablet Take 1 tablet (2.5 mg total) by mouth daily. 90 tablet 2  . aspirin 81 MG tablet Take 1 tablet (81 mg total) by mouth daily.    . cholecalciferol (VITAMIN D) 1000 UNITS tablet Take 1,000 Units by mouth daily.    . clopidogrel (PLAVIX) 75 MG tablet Take 1 tablet (75 mg total) by mouth daily. 90 tablet 2  . fish oil-omega-3 fatty acids 1000 MG capsule Take 1 g by mouth 2 (two) times daily.    . fluticasone (FLONASE) 50 MCG/ACT nasal spray Place 1 spray into both nostrils as needed for allergies.   2  . hydrOXYzine (ATARAX/VISTARIL) 25 MG tablet Take 1 tablet (25 mg total) by mouth 3 (three) times daily as needed. 30 tablet 0  . Multiple  Vitamin (MULTIVITAMIN WITH MINERALS) TABS Take 1 tablet by mouth daily.    . nitroGLYCERIN (NITROSTAT) 0.4 MG SL tablet Place 1 tablet (0.4 mg total) under the tongue every 5 (five) minutes as needed for chest pain. 25 tablet 5  . Psyllium (METAMUCIL) 30.9 % POWD Take by mouth 1 dose over 24 hours.      . rosuvastatin (CRESTOR) 40 MG tablet TAKE 1 TABLET (40 MG TOTAL) BY MOUTH EVERY OTHER DAY. 45 tablet 2  . vitamin B-12 (CYANOCOBALAMIN) 1000 MCG tablet Take 1,000 mcg by mouth daily.    . vitamin C (ASCORBIC ACID) 500 MG tablet Take 500 mg by mouth daily.     No current facility-administered medications for this visit.     Review of Systems : See HPI for pertinent positives and negatives.  Physical Examination  Vitals:   09/12/17 1401 09/12/17 1413 09/12/17 1414  BP: (!) 144/73 (!) 141/86 (!) 144/87  Pulse: 71 72 72  Resp: 16    Temp: (!) 96.8 F (36 C)    TempSrc: Oral    SpO2: 95%    Weight: 165 lb (74.8 kg)    Height: 5\' 3"  (1.6 m)     Body mass index is 29.23 kg/m.  General: WDWN female in NAD GAIT:normal HENT: No gross abnormalities  Eyes: PERRLA Pulmonary: Respirations are non-labored, CTAB Cardiac: regular rhythm, no detected murmur.  VASCULAR EXAM Carotid Bruits Right Left   Negative Negative    Abdominal aortic pulse is not palpable. Radial pulses are 2+ palpable and equal.      LE Pulses Right Left   POPLITEAL not palpable  not palpable   POSTERIOR TIBIAL 2+ palpable  2+ palpable    DORSALIS PEDIS  ANTERIOR TIBIAL Not palpable  not palpable    Gastrointestinal: soft, nontender, BS WNL, no r/g, no palpable masses Musculoskeletal: No muscle atrophy/wasting. M/S 5/5 throughout, extremities without ischemic changes. Skin: No rashes, no ulcers, no cellulitis.    Neurologic:  A&O X 3; appropriate affect, sensation is normal; speech is normal, CN 2-12 intact, pain and light touch intact in extremities, motor exam as listed above. Psychiatric: Normal thought content, mood appropriate to clinical situation.    Assessment: [name] is a 77 y.o. female who has no history of stroke or TIA.  Fortunately she does not have DM and has never used tobacco. She does have a significant family history  of CVD events.  She exercises regularly.  She takes a daily ASA and Plavix, takes a statin 3x/week.   DATA Carotid Duplex (09/12/17): 60-79% right internal carotid artery stenosis 1-39% left ICA stenosis.  Bilateral vertebral arteries are antegrade.  Bilateral subclavian arteries have multiphasic waveforms.  Stable and unchanged since studies performed on 08/18/2013, 03/13/14, 04/10/2015,  10/13/15, 04-19-16, and 10-28-16.   Plan:  Follow-up in 61monthswith Carotid Duplex scan.  I discussed in depth with the patient the nature of atherosclerosis, and emphasized the importance of maximal medical management including strict control of blood pressure, blood glucose, and lipid levels, obtaining regular exercise, and contiued cessation of smoking.  The patient is aware that without maximal medical management the underlying atherosclerotic disease process will progress, limiting the benefit of any interventions. The patient was given information about stroke prevention and what symptoms should prompt the patient to seek immediate medical care. Thank you for allowing [place] to participate in this patient's care.  Clemon Chambers, RN, MSN, FNP-C [facility] Office: [phone]  Clinic Physician: Donzetta Matters on call  09/12/17 2:22 PM

## 2017-09-12 NOTE — Patient Instructions (Signed)

## 2017-09-15 ENCOUNTER — Other Ambulatory Visit: Payer: Self-pay | Admitting: Cardiovascular Disease

## 2017-10-14 ENCOUNTER — Other Ambulatory Visit: Payer: Self-pay | Admitting: Cardiovascular Disease

## 2017-10-15 ENCOUNTER — Other Ambulatory Visit: Payer: Self-pay

## 2017-11-07 ENCOUNTER — Other Ambulatory Visit: Payer: Self-pay | Admitting: Cardiovascular Disease

## 2017-11-07 NOTE — Telephone Encounter (Signed)
OK to refill. Pt is seeing Dr. Angelena Form on 11/17/17

## 2017-11-07 NOTE — Telephone Encounter (Signed)
Patient does not have a recent lipid panel in epic and per last office visit, lipids are managed by pcp. Okay to refill? Please advise. Thanks, MI

## 2017-11-11 ENCOUNTER — Other Ambulatory Visit: Payer: Self-pay | Admitting: Cardiovascular Disease

## 2017-11-17 ENCOUNTER — Ambulatory Visit: Payer: Medicare Other | Admitting: Cardiovascular Disease

## 2017-11-21 DIAGNOSIS — R252 Cramp and spasm: Secondary | ICD-10-CM | POA: Insufficient documentation

## 2017-11-21 DIAGNOSIS — M25551 Pain in right hip: Secondary | ICD-10-CM | POA: Insufficient documentation

## 2017-11-21 DIAGNOSIS — Z1389 Encounter for screening for other disorder: Secondary | ICD-10-CM | POA: Insufficient documentation

## 2017-11-21 DIAGNOSIS — B070 Plantar wart: Secondary | ICD-10-CM | POA: Insufficient documentation

## 2017-11-21 DIAGNOSIS — I8390 Asymptomatic varicose veins of unspecified lower extremity: Secondary | ICD-10-CM | POA: Insufficient documentation

## 2017-12-08 ENCOUNTER — Other Ambulatory Visit: Payer: Self-pay | Admitting: Cardiovascular Disease

## 2017-12-09 ENCOUNTER — Other Ambulatory Visit: Payer: Self-pay | Admitting: Cardiovascular Disease

## 2017-12-19 ENCOUNTER — Other Ambulatory Visit: Payer: Self-pay | Admitting: Internal Medicine

## 2017-12-19 DIAGNOSIS — Z1231 Encounter for screening mammogram for malignant neoplasm of breast: Secondary | ICD-10-CM

## 2018-01-23 ENCOUNTER — Ambulatory Visit
Admission: RE | Admit: 2018-01-23 | Discharge: 2018-01-23 | Disposition: A | Discharge status: Home / Self Care | Payer: Medicare Other | Source: Ambulatory Visit | Attending: Internal Medicine | Admitting: Internal Medicine

## 2018-01-23 DIAGNOSIS — Z1231 Encounter for screening mammogram for malignant neoplasm of breast: Secondary | ICD-10-CM

## 2018-03-04 ENCOUNTER — Other Ambulatory Visit: Payer: Self-pay | Admitting: Cardiovascular Disease

## 2018-03-17 NOTE — Progress Notes (Signed)
Chief Complaint  Patient presents with  . Follow-up    CAD      History of Present Illness: 77 yo female with history of CAD with previous stenting of the mid LAD, Diagonal and Proximal and mid RCA, HTN, HLD, OSA, carotid artery disease here today for follow up. Her cardiac history includes drug eluting stents placed in the LAD and Diagonal in May 2008, followed by drug eluting stent placement in the RCA in December 2008. At her initial visit in our office November 2012, she had c/o left arm pain and her prior anginal equivalent was left arm pain. Stress test in November 2012 with no ischemia. I saw her June 2013 and she c/o recurrence of pain in her left arm with exertion associated with fatigue and SOB. Cardiac cath June 2013 with stable disease, patent stents LAD, Diagonal, RCA. Admitted July 2014 with left shoulder pain. Stress myoview 12/28/12 with no ischemia. She had some left sided weakness in March 2015 and was seen in [facility] and was found to have moderate carotid artery disease. She has been followed in the [facility] office for her carotid disease.   She is here today for follow up. The patient denies any chest pain, dyspnea, palpitations, lower extremity edema, orthopnea, PND, dizziness, near syncope or syncope.   Primary Care Physician: Shon Baton, MD  Past Medical History:  Diagnosis Date  . CAD (coronary artery disease)    a. DES to LAD, diagonal 10/2006. b. DES to RCA 05/2007. c. Last cath 11/2012: patent stents, stable dz. d. Carlton Adam cardiolite negative 12/2012.  . Cataract    left eye  . Diverticulitis   . Diverticulosis of colon   . Hemorrhoids   . History of recurrent UTIs   . Hx of adenomatous colonic polyps    tubular  . Hyperlipidemia   . Hypertension   . Peripheral vascular disease ([place])   . Sleep apnea    wears CPAP    Past Surgical History:  Procedure Laterality Date  . ANGIOPLASTY  5/08  and  12/08    stent x3  . Cataract left    . EYE SURGERY    . FOOT TENDON  SURGERY Left Sept. 20, 2016    Left Calf / foot -   Dr. Sharol Given  . Right shoulder surgery    . Vitrectomy left eye      Current Outpatient Medications  Medication Sig Dispense Refill  . amLODipine (NORVASC) 2.5 MG tablet Take one tablet by mouth daily. 90 tablet 3  . aspirin 81 MG tablet Take 1 tablet (81 mg total) by mouth daily.    . cholecalciferol (VITAMIN D) 1000 UNITS tablet Take 1,000 Units by mouth daily.    . clopidogrel (PLAVIX) 75 MG tablet Take 1 tablet (75 mg total) by mouth daily. 90 tablet 3  . fish oil-omega-3 fatty acids 1000 MG capsule Take 1 g by mouth 2 (two) times daily.    . fluticasone (FLONASE) 50 MCG/ACT nasal spray Place 1 spray into both nostrils as needed for allergies.   2  . hydrOXYzine (ATARAX/VISTARIL) 25 MG tablet Take 1 tablet (25 mg total) by mouth 3 (three) times daily as needed. 30 tablet 0  . Multiple Vitamin (MULTIVITAMIN WITH MINERALS) TABS Take 1 tablet by mouth daily.    . nitroGLYCERIN (NITROSTAT) 0.4 MG SL tablet Place 1 tablet (0.4 mg total) under the tongue every 5 (five) minutes as needed for chest pain. 25 tablet 5  . Psyllium (METAMUCIL)  30.9 % POWD Take by mouth 1 dose over 24 hours.      . rosuvastatin (CRESTOR) 40 MG tablet Take 1 tablet (40 mg total) by mouth every other day. 45 tablet 3  . vitamin B-12 (CYANOCOBALAMIN) 1000 MCG tablet Take 1,000 mcg by mouth daily.    . vitamin C (ASCORBIC ACID) 500 MG tablet Take 500 mg by mouth daily.     No current facility-administered medications for this visit.     Allergies  Allergen Reactions  . Fluoride Preparations Other (See Comments)    A test for eye to make pictures, (  causing pressure of Left arm )    Social History   Socioeconomic History  . Marital status: Married    Spouse name: Not on file  . Number of children: 0  . Years of education: Not on file  . Highest education level: Not on file  Occupational History  . Occupation: retired     Fish farm manager: RETIRED  Social Needs  .  Financial resource strain: Not on file  . Food insecurity:    Worry: Not on file    Inability: Not on file  . Transportation needs:    Medical: Not on file    Non-medical: Not on file  Tobacco Use  . Smoking status: Never Smoker  . Smokeless tobacco: Never Used  Substance and Sexual Activity  . Alcohol use: Yes    Alcohol/week: 2.0 - 3.0 standard drinks    Types: 2 - 3 Standard drinks or equivalent per week  . Drug use: No  . Sexual activity: Not on file  Lifestyle  . Physical activity:    Days per week: Not on file    Minutes per session: Not on file  . Stress: Not on file  Relationships  . Social connections:    Talks on phone: Not on file    Gets together: Not on file    Attends religious service: Not on file    Active member of club or organization: Not on file    Attends meetings of clubs or organizations: Not on file    Relationship status: Not on file  . Intimate partner violence:    Fear of current or ex partner: Not on file    Emotionally abused: Not on file    Physically abused: Not on file    Forced sexual activity: Not on file  Other Topics Concern  . Not on file  Social History Narrative  . Not on file    Family History  Problem Relation Age of Onset  . Colon polyps Brother   . Heart disease Brother        before age 51  . Cancer Brother   . Hyperlipidemia Brother   . Heart attack Brother   . Heart disease Father        before age 37  . Deep vein thrombosis Father   . Hyperlipidemia Father   . Heart attack Father 69  . Heart disease Brother   . Stroke Mother        TIA's  . Colon polyps Paternal Grandfather   . Heart disease Other        Grandmother   . Colon cancer Neg Hx   . Esophageal cancer Neg Hx   . Stomach cancer Neg Hx   . Rectal cancer Neg Hx     Review of Systems:  As stated in the HPI and otherwise negative.   BP 110/80   Pulse 65  Ht 5\' 3"  (1.6 m)   Wt 165 lb (74.8 kg)   SpO2 93%   BMI 29.23 kg/m   Physical  Examination:  General: Well developed, well nourished, NAD  HEENT: OP clear, mucus membranes moist  SKIN: warm, dry. No rashes. Neuro: No focal deficits  Musculoskeletal: Muscle strength 5/5 all ext  Psychiatric: Mood and affect normal  Neck: No JVD, no carotid bruits, no thyromegaly, no lymphadenopathy.  Lungs:Clear bilaterally, no wheezes, rhonci, crackles Cardiovascular: Regular rate and rhythm. No murmurs, gallops or rubs. Abdomen:Soft. Bowel sounds present. Non-tender.  Extremities: No lower extremity edema. Pulses are 2 + in the bilateral DP/PT.  Cardiac cath 11/27/11:  Left main:  Left Anterior Descending Artery: Large caliber vessel that courses to the apex. There is a patent stent in the proximal to mid LAD with minimal, 20% in stent restenosis. The diagonal branch arises just before the LAD stent. There is a patent stent in the ostium and proximal segment of the diagonal branch with perhaps 20% ostial stenosis.  Circumflex Artery: Moderate sized vessel with no disease noted. Small early OM branch with no disease.  Right Coronary Artery: Large, dominant vessel with patent stent in the proximal vessel. Just before the stent, there is a 20% plaque. Just beyond the stent, there is a 30% plaque.  Left Ventricular Angiogram: LVEF 65-70%.   EKG:  EKG is  ordered today. The ekg ordered today demonstrates NSR, rate 65 bpm.   Recent Labs: No results found for requested labs within last 8760 hours.   Lipid Panel Followed in primary care   Wt Readings from Last 3 Encounters:  03/18/18 165 lb (74.8 kg)  09/12/17 165 lb (74.8 kg)  02/13/17 163 lb (73.9 kg)     Other studies Reviewed: Additional studies/ records that were reviewed today include: . Review of the above records demonstrates:    Assessment and Plan:   1. CAD without angina: She has had stents placed in the LAD, Diagonal and RCA. Last cath in June 2013 with stable CAD. She has no chest pain. She has some left shoulder  pain when walking but improves while walking. Continue ASA, Plavix and statin.     2. HTN:  BP is controlled. No changes  3. HYPERLIPIDEMIA: Lipids followed in primary care. Continue statin  4. Carotid artery disease: this is followed in [facility].   Current medicines are reviewed at length with the patient today.  The patient does not have concerns regarding medicines.  The following changes have been made:  no change  Labs/ tests ordered today include:  No orders of the defined types were placed in this encounter.   Disposition:   FU with me in 12  months  Signed, Lauree Chandler, MD 03/18/2018 10:13 AM    [address], [place], [place]  [address] Phone: [phone]; Fax: [phone]

## 2018-03-18 ENCOUNTER — Ambulatory Visit: Payer: Medicare Other | Admitting: Cardiovascular Disease

## 2018-03-18 ENCOUNTER — Encounter: Payer: Self-pay | Admitting: Cardiovascular Disease

## 2018-03-18 VITALS — BP 110/80 | HR 65 | Ht 63.0 in | Wt 165.0 lb

## 2018-03-18 DIAGNOSIS — E7800 Pure hypercholesterolemia, unspecified: Secondary | ICD-10-CM

## 2018-03-18 DIAGNOSIS — I10 Essential (primary) hypertension: Secondary | ICD-10-CM

## 2018-03-18 DIAGNOSIS — I6523 Occlusion and stenosis of bilateral carotid arteries: Secondary | ICD-10-CM | POA: Diagnosis not present

## 2018-03-18 DIAGNOSIS — I2510 Atherosclerotic heart disease of native coronary artery without angina pectoris: Secondary | ICD-10-CM | POA: Diagnosis not present

## 2018-03-18 MED ORDER — ROSUVASTATIN CALCIUM 40 MG PO TABS: 40.0000 mg | ORAL_TABLET | ORAL | 3 refills | Status: DC

## 2018-03-18 MED ORDER — NITROGLYCERIN 0.4 MG SL SUBL: 0.4000 mg | SUBLINGUAL_TABLET | SUBLINGUAL | 5 refills | Status: DC | PRN

## 2018-03-18 MED ORDER — CLOPIDOGREL BISULFATE 75 MG PO TABS: 75.0000 mg | ORAL_TABLET | Freq: Every day | ORAL | 3 refills | Status: DC

## 2018-03-18 MED ORDER — AMLODIPINE BESYLATE 2.5 MG PO TABS: ORAL_TABLET | ORAL | 3 refills | Status: DC

## 2018-03-18 NOTE — Patient Instructions (Signed)

## 2018-03-19 NOTE — Addendum Note (Signed)
Addended by: Mendel Ryder on: 03/19/2018 10:13 AM   Modules accepted: Orders

## 2018-05-17 ENCOUNTER — Encounter

## 2018-06-01 ENCOUNTER — Other Ambulatory Visit: Payer: Self-pay

## 2018-06-01 DIAGNOSIS — I6523 Occlusion and stenosis of bilateral carotid arteries: Secondary | ICD-10-CM

## 2018-06-01 DIAGNOSIS — I6521 Occlusion and stenosis of right carotid artery: Secondary | ICD-10-CM

## 2018-06-24 ENCOUNTER — Ambulatory Visit: Payer: Medicare Other | Admitting: Family

## 2018-06-24 ENCOUNTER — Encounter (HOSPITAL_COMMUNITY): Payer: Medicare Other

## 2018-07-16 ENCOUNTER — Ambulatory Visit: Payer: Medicare Other | Admitting: Physician Assistant

## 2018-07-16 ENCOUNTER — Encounter: Payer: Self-pay | Admitting: Family

## 2018-07-16 ENCOUNTER — Ambulatory Visit (HOSPITAL_COMMUNITY)
Admission: RE | Admit: 2018-07-16 | Discharge: 2018-07-16 | Disposition: A | Discharge status: Home / Self Care | Payer: Medicare Other | Source: Ambulatory Visit | Attending: Family | Admitting: Family

## 2018-07-16 ENCOUNTER — Other Ambulatory Visit: Payer: Self-pay

## 2018-07-16 VITALS — BP 134/82 | HR 71 | Temp 97.9°F | Resp 16 | Ht 63.0 in | Wt 164.0 lb

## 2018-07-16 DIAGNOSIS — I6521 Occlusion and stenosis of right carotid artery: Secondary | ICD-10-CM | POA: Diagnosis present

## 2018-07-16 DIAGNOSIS — I6523 Occlusion and stenosis of bilateral carotid arteries: Secondary | ICD-10-CM | POA: Insufficient documentation

## 2018-07-16 NOTE — Progress Notes (Signed)
History of Present Illness:  Patient is a 78 y.o. year old female who presents for evaluation of carotid stenosis surveillance.   Previous carotid duplex showed   RICA 44-96% stenosis, LICA <75% stenosis.   The patient denies symptoms of TIA, amaurosis, or stroke.  She has significant family hx for strokes and MI's. Pt has 3 cardiac stents, denies any hx of MI.  Other past medical history includes: Hyperlipidemia and  HTN.  She continues to take aspirin and Plavix daily for antiplatelet therapy and Crestor for hyperlipidemia.       Past Medical History:  Diagnosis Date  . CAD (coronary artery disease)    a. DES to LAD, diagonal 10/2006. b. DES to RCA 05/2007. c. Last cath 11/2012: patent stents, stable dz. d. Carlton Adam cardiolite negative 12/2012.  . Cataract    left eye  . Diverticulitis   . Diverticulosis of colon   . Hemorrhoids   . History of recurrent UTIs   . Hx of adenomatous colonic polyps    tubular  . Hyperlipidemia   . Hypertension   . Peripheral vascular disease ([place])   . Sleep apnea    wears CPAP    Past Surgical History:  Procedure Laterality Date  . ANGIOPLASTY  5/08  and  12/08    stent x3  . Cataract left    . EYE SURGERY    . FOOT TENDON SURGERY Left Sept. 20, 2016    Left Calf / foot -   Dr. Sharol Given  . Right shoulder surgery    . Vitrectomy left eye       Social History Social History   Tobacco Use  . Smoking status: Never Smoker  . Smokeless tobacco: Never Used  Substance Use Topics  . Alcohol use: Yes    Alcohol/week: 2.0 - 3.0 standard drinks    Types: 2 - 3 Standard drinks or equivalent per week  . Drug use: No    Family History Family History  Problem Relation Age of Onset  . Colon polyps Brother   . Heart disease Brother        before age 68  . Cancer Brother   . Hyperlipidemia Brother   . Heart attack Brother   . Heart disease Father        before age 54  . Deep vein thrombosis Father   . Hyperlipidemia Father   . Heart  attack Father 12  . Heart disease Brother   . Stroke Mother        TIA's  . Colon polyps Paternal Grandfather   . Heart disease Other        Grandmother   . Colon cancer Neg Hx   . Esophageal cancer Neg Hx   . Stomach cancer Neg Hx   . Rectal cancer Neg Hx     Allergies  Allergies  Allergen Reactions  . Fluoride Preparations Other (See Comments)    A test for eye to make pictures, (  causing pressure of Left arm )     Current Outpatient Medications  Medication Sig Dispense Refill  . amLODipine (NORVASC) 2.5 MG tablet Take one tablet by mouth daily. 90 tablet 3  . aspirin 81 MG tablet Take 1 tablet (81 mg total) by mouth daily.    . cholecalciferol (VITAMIN D) 1000 UNITS tablet Take 1,000 Units by mouth daily.    . clopidogrel (PLAVIX) 75 MG tablet Take 1 tablet (75 mg total) by mouth daily. [place]  tablet 3  . fish oil-omega-3 fatty acids 1000 MG capsule Take 1 g by mouth 2 (two) times daily.    . fluticasone (FLONASE) 50 MCG/ACT nasal spray Place 1 spray into both nostrils as needed for allergies.   2  . hydrOXYzine (ATARAX/VISTARIL) 25 MG tablet Take 1 tablet (25 mg total) by mouth 3 (three) times daily as needed. 30 tablet 0  . Multiple Vitamin (MULTIVITAMIN WITH MINERALS) TABS Take 1 tablet by mouth daily.    . nitroGLYCERIN (NITROSTAT) 0.4 MG SL tablet Place 1 tablet (0.4 mg total) under the tongue every 5 (five) minutes as needed for chest pain. 25 tablet 5  . predniSONE (STERAPRED UNI-PAK 21 TAB) 5 MG (21) TBPK tablet     . Psyllium (METAMUCIL) 30.9 % POWD Take by mouth 1 dose over 24 hours.      . rosuvastatin (CRESTOR) 40 MG tablet Take 1 tablet (40 mg total) by mouth every other day. 45 tablet 3  . vitamin B-12 (CYANOCOBALAMIN) 1000 MCG tablet Take 1,000 mcg by mouth daily.    . vitamin C (ASCORBIC ACID) 500 MG tablet Take 500 mg by mouth daily.     No current facility-administered medications for this visit.     ROS:   General:  No weight loss, Fever,  chills  HEENT: No recent headaches, no nasal bleeding, no visual changes, no sore throat  Neurologic: No dizziness, blackouts, seizures. No recent symptoms of stroke or mini- stroke. No recent episodes of slurred speech, or temporary blindness.  Cardiac: No recent episodes of chest pain/pressure, no shortness of breath at rest.  No shortness of breath with exertion.  Denies history of atrial fibrillation or irregular heartbeat  Vascular: No history of rest pain in feet.  No history of claudication.  No history of non-healing ulcer, No history of DVT   Pulmonary: No home oxygen, no productive cough, no hemoptysis,  No asthma or wheezing  Musculoskeletal:  [ ]  Arthritis, [ ]  Low back pain,  [ ]  Joint pain  Hematologic:No history of hypercoagulable state.  No history of easy bleeding.  No history of anemia  Gastrointestinal: No hematochezia or melena,  No gastroesophageal reflux, no trouble swallowing  Urinary: [ ]  chronic Kidney disease, [ ]  on HD - [ ]  MWF or [ ]  TTHS, [ ]  Burning with urination, [ ]  Frequent urination, [ ]  Difficulty urinating;   Skin: No rashes  Psychological: No history of anxiety,  No history of depression   Physical Examination  Vitals:   07/16/18 1339 07/16/18 1344  BP: 132/72 134/82  Pulse: 75 71  Resp: 16   Temp: 97.9 F (36.6 C)   TempSrc: Oral   SpO2: 94%   Weight: 164 lb (74.4 kg)   Height: 5\' 3"  (1.6 m)     Body mass index is 29.05 kg/m.  General:  Alert and oriented, no acute distress HEENT: Normal Neck: No bruit or JVD Pulmonary: Clear to auscultation bilaterally Cardiac: Regular Rate and Rhythm without murmur Gastrointestinal: Soft, non-tender, non-distended, no mass, no scars Skin: No rash Extremity Pulses:  2+ radial, brachial, femoral, dorsalis pedis, posterior tibial pulses bilaterally Musculoskeletal: No deformity or edema  Neurologic: Upper and lower extremity motor 5/5 and symmetric  DATA:    Right Carotid  Findings: +----------+--------+--------+--------+------------+--------+           PSV cm/sEDV cm/sStenosisDescribe    Comments +----------+--------+--------+--------+------------+--------+ CCA Prox  106     15                                   +----------+--------+--------+--------+------------+--------+  CCA Mid   57      15                                   +----------+--------+--------+--------+------------+--------+ CCA Distal67      21              heterogenous         +----------+--------+--------+--------+------------+--------+ ICA Prox  196     64      40-59%  calcific             +----------+--------+--------+--------+------------+--------+ ICA Mid   157     43      40-59%  calcific             +----------+--------+--------+--------+------------+--------+ ICA Distal171     49                                   +----------+--------+--------+--------+------------+--------+ ECA       80      12                                   +----------+--------+--------+--------+------------+--------+  +----------+--------+-------+----------------+-------------------+           PSV cm/sEDV cmsDescribe        Arm Pressure (mmHG) +----------+--------+-------+----------------+-------------------+ [id number]            Multiphasic, WNL                    +----------+--------+-------+----------------+-------------------+  +---------+--------+--+--------+--+---------+ VertebralPSV cm/s52EDV cm/s11Antegrade +---------+--------+--+--------+--+---------+    Left Carotid Findings: +----------+--------+--------+--------+------------+--------+           PSV cm/sEDV cm/sStenosisDescribe    Comments +----------+--------+--------+--------+------------+--------+ CCA Prox  74      19                                   +----------+--------+--------+--------+------------+--------+ CCA Mid   75      18                                    +----------+--------+--------+--------+------------+--------+ CCA Distal76      20                                   +----------+--------+--------+--------+------------+--------+ ICA Prox  38      11      1-39%   heterogenous         +----------+--------+--------+--------+------------+--------+ ICA Mid   51      17                                   +----------+--------+--------+--------+------------+--------+ ICA Distal67      20                                   +----------+--------+--------+--------+------------+--------+ ECA       86      15                                   +----------+--------+--------+--------+------------+--------+  +----------+--------+--------+----------------+-------------------+  SubclavianPSV cm/sEDV cm/sDescribe        Arm Pressure (mmHG) +----------+--------+--------+----------------+-------------------+           139             Multiphasic, WNL                    +----------+--------+--------+----------------+-------------------+  +---------+--------+--+--------+--+---------+ VertebralPSV cm/s50EDV cm/s11Antegrade +---------+--------+--+--------+--+---------+    Summary: Right Carotid: Velocities in the right ICA are consistent with a 40-59%                stenosis.  Left Carotid: Velocities in the left ICA are consistent with a 1-39% stenosis.  Vertebrals:  Bilateral vertebral arteries demonstrate antegrade flow. Subclavians: Normal flow hemodynamics were seen in bilateral subclavian              arteries.  ASSESSMENT:  Asymptomatic carotid stenosis right ICA< 59%.      PLAN: The duplex is essentially unchanged.  She will f/u in 1 year for continued surveillance and repeat carotid duplex.  We reviewed signs and symptoms of stroke and TIA.  If she has problems or concerns she will call.     Roxy Horseman PA-C [facility] Office: [phone]  MD in clinic: [facility]

## 2018-11-26 DIAGNOSIS — G43109 Migraine with aura, not intractable, without status migrainosus: Secondary | ICD-10-CM | POA: Insufficient documentation

## 2018-11-26 DIAGNOSIS — M6530 Trigger finger, unspecified finger: Secondary | ICD-10-CM | POA: Insufficient documentation

## 2018-12-28 ENCOUNTER — Other Ambulatory Visit: Payer: Self-pay | Admitting: Internal Medicine

## 2018-12-28 DIAGNOSIS — Z1231 Encounter for screening mammogram for malignant neoplasm of breast: Secondary | ICD-10-CM

## 2019-02-08 ENCOUNTER — Ambulatory Visit
Admission: RE | Admit: 2019-02-08 | Discharge: 2019-02-08 | Disposition: A | Discharge status: Home / Self Care | Payer: Medicare Other | Source: Ambulatory Visit | Attending: Internal Medicine | Admitting: Internal Medicine

## 2019-02-08 ENCOUNTER — Other Ambulatory Visit: Payer: Self-pay

## 2019-02-08 DIAGNOSIS — Z1231 Encounter for screening mammogram for malignant neoplasm of breast: Secondary | ICD-10-CM

## 2019-04-09 DIAGNOSIS — K5732 Diverticulitis of large intestine without perforation or abscess without bleeding: Secondary | ICD-10-CM | POA: Insufficient documentation

## 2019-04-21 ENCOUNTER — Telehealth: Payer: Self-pay | Admitting: *Deleted

## 2019-04-21 NOTE — Telephone Encounter (Signed)
    COVID-19 Pre-Screening Questions:  . In the past 7 to 10 days have you had a cough,  shortness of breath, headache, congestion, fever (100 or greater) body aches, chills, sore throat, or sudden loss of taste or sense of smell?  Brief cough at night that resolves. Not new for pt . Have you been around anyone with known Covid 19.-no . Have you been around anyone who is awaiting Covid 19 test results in the past 7 to 10 days? no . Have you been around anyone who has been exposed to Covid 19, or has mentioned symptoms of Covid 19 within the past 7 to 10 days? no  If you have any concerns/questions about symptoms patients report during screening (either on the phone or at threshold). Contact the provider seeing the patient or DOD for further guidance.  If neither are available contact a member of the leadership team.        Negative covid test 2 weeks ago. Pt on wait list for sooner appointment with Dr Angelena Form.  I spoke with pt and scheduled appointment for November 6,2020 at 2:00.  Pt aware to arrive 15 minutes prior to appointment and wear mask the entire time she is in the building.  She is aware no one can accompany her to this appointment.

## 2019-04-22 DIAGNOSIS — K5730 Diverticulosis of large intestine without perforation or abscess without bleeding: Secondary | ICD-10-CM | POA: Insufficient documentation

## 2019-04-23 ENCOUNTER — Other Ambulatory Visit: Payer: Self-pay

## 2019-04-23 ENCOUNTER — Encounter: Payer: Self-pay | Admitting: Cardiovascular Disease

## 2019-04-23 ENCOUNTER — Ambulatory Visit: Payer: Medicare Other | Admitting: Cardiovascular Disease

## 2019-04-23 VITALS — BP 134/80 | HR 71 | Ht 63.0 in | Wt 160.0 lb

## 2019-04-23 DIAGNOSIS — I2510 Atherosclerotic heart disease of native coronary artery without angina pectoris: Secondary | ICD-10-CM

## 2019-04-23 DIAGNOSIS — R011 Cardiac murmur, unspecified: Secondary | ICD-10-CM

## 2019-04-23 DIAGNOSIS — I6523 Occlusion and stenosis of bilateral carotid arteries: Secondary | ICD-10-CM | POA: Diagnosis not present

## 2019-04-23 DIAGNOSIS — E7800 Pure hypercholesterolemia, unspecified: Secondary | ICD-10-CM

## 2019-04-23 DIAGNOSIS — I10 Essential (primary) hypertension: Secondary | ICD-10-CM | POA: Diagnosis not present

## 2019-04-23 MED ORDER — NITROGLYCERIN 0.4 MG SL SUBL: 0.4000 mg | SUBLINGUAL_TABLET | SUBLINGUAL | 5 refills | Status: DC | PRN

## 2019-04-23 NOTE — Progress Notes (Signed)
Chief Complaint  Patient presents with  . Follow-up    CAD   History of Present Illness: 78 yo female with history of CAD with previous stenting of the mid LAD, Diagonal and Proximal and mid RCA, HTN, HLD, OSA, carotid artery disease here today for follow up. Her cardiac history includes drug eluting stents placed in the LAD and Diagonal in May 2008, followed by drug eluting stent placement in the RCA in December 2008. At her initial visit in our office November 2012, she had c/o left arm pain and her prior anginal equivalent was left arm pain. Stress test in November 2012 with no ischemia. I saw her June 2013 and she c/o recurrence of pain in her left arm with exertion associated with fatigue and SOB. Cardiac cath June 2013 with stable disease, patent stents LAD, Diagonal, RCA. Admitted July 2014 with left shoulder pain. Stress myoview 12/28/12 with no ischemia. She had some left sided weakness in March 2015 and was seen in [facility] and was found to have moderate carotid artery disease. She has been followed in the [facility] office for her carotid disease.   She is here today for follow up. The patient denies any chest pain, dyspnea, palpitations, lower extremity edema, orthopnea, PND, dizziness, near syncope or syncope. Rare left shoulder pain for years. No change.   Primary Care Physician: Shon Baton, MD  Past Medical History:  Diagnosis Date  . CAD (coronary artery disease)    a. DES to LAD, diagonal 10/2006. b. DES to RCA 05/2007. c. Last cath 11/2012: patent stents, stable dz. d. Carlton Adam cardiolite negative 12/2012.  . Cataract    left eye  . Diverticulitis   . Diverticulosis of colon   . Hemorrhoids   . History of recurrent UTIs   . Hx of adenomatous colonic polyps    tubular  . Hyperlipidemia   . Hypertension   . Peripheral vascular disease ([place])   . Sleep apnea    wears CPAP    Past Surgical History:  Procedure Laterality Date  . ANGIOPLASTY  5/08  and  12/08    stent x3  . Cataract  left    . EYE SURGERY    . FOOT TENDON SURGERY Left Sept. 20, 2016    Left Calf / foot -   Dr. Sharol Given  . Right shoulder surgery    . Vitrectomy left eye      Current Outpatient Medications  Medication Sig Dispense Refill  . amLODipine (NORVASC) 2.5 MG tablet Take one tablet by mouth daily. 90 tablet 3  . aspirin 81 MG tablet Take 1 tablet (81 mg total) by mouth daily.    . cholecalciferol (VITAMIN D) 1000 UNITS tablet Take 1,000 Units by mouth daily.    . clopidogrel (PLAVIX) 75 MG tablet Take 1 tablet (75 mg total) by mouth daily. 90 tablet 3  . fish oil-omega-3 fatty acids 1000 MG capsule Take 1 g by mouth 2 (two) times daily.    . fluticasone (FLONASE) 50 MCG/ACT nasal spray Place 1 spray into both nostrils as needed for allergies.   2  . Multiple Vitamin (MULTIVITAMIN WITH MINERALS) TABS Take 1 tablet by mouth daily.    . nitroGLYCERIN (NITROSTAT) 0.4 MG SL tablet Place 1 tablet (0.4 mg total) under the tongue every 5 (five) minutes as needed for chest pain. 25 tablet 5  . Psyllium (METAMUCIL) 30.9 % POWD Take by mouth 1 dose over 24 hours.      . rosuvastatin (CRESTOR)  40 MG tablet Take 1 tablet (40 mg total) by mouth every other day. 45 tablet 3  . vitamin B-12 (CYANOCOBALAMIN) 1000 MCG tablet Take 1,000 mcg by mouth daily.    . vitamin C (ASCORBIC ACID) 500 MG tablet Take 500 mg by mouth daily.     No current facility-administered medications for this visit.     Allergies  Allergen Reactions  . Fluoride Preparations Other (See Comments)    A test for eye to make pictures, (  causing pressure of Left arm )    Social History   Socioeconomic History  . Marital status: Married    Spouse name: Not on file  . Number of children: 0  . Years of education: Not on file  . Highest education level: Not on file  Occupational History  . Occupation: retired     Fish farm manager: RETIRED  Social Needs  . Financial resource strain: Not on file  . Food insecurity    Worry: Not on file     Inability: Not on file  . Transportation needs    Medical: Not on file    Non-medical: Not on file  Tobacco Use  . Smoking status: Never Smoker  . Smokeless tobacco: Never Used  Substance and Sexual Activity  . Alcohol use: Yes    Alcohol/week: 2.0 - 3.0 standard drinks    Types: 2 - 3 Standard drinks or equivalent per week  . Drug use: No  . Sexual activity: Not on file  Lifestyle  . Physical activity    Days per week: Not on file    Minutes per session: Not on file  . Stress: Not on file  Relationships  . Social Herbalist on phone: Not on file    Gets together: Not on file    Attends religious service: Not on file    Active member of club or organization: Not on file    Attends meetings of clubs or organizations: Not on file    Relationship status: Not on file  . Intimate partner violence    Fear of current or ex partner: Not on file    Emotionally abused: Not on file    Physically abused: Not on file    Forced sexual activity: Not on file  Other Topics Concern  . Not on file  Social History Narrative  . Not on file    Family History  Problem Relation Age of Onset  . Colon polyps Brother   . Heart disease Brother        before age 74  . Cancer Brother   . Hyperlipidemia Brother   . Heart attack Brother   . Heart disease Father        before age 44  . Deep vein thrombosis Father   . Hyperlipidemia Father   . Heart attack Father 22  . Heart disease Brother   . Stroke Mother        TIA's  . Colon polyps Paternal Grandfather   . Heart disease Other        Grandmother   . Colon cancer Neg Hx   . Esophageal cancer Neg Hx   . Stomach cancer Neg Hx   . Rectal cancer Neg Hx     Review of Systems:  As stated in the HPI and otherwise negative.   BP 134/80   Pulse 71   Ht 5\' 3"  (1.6 m)   Wt 160 lb (72.6 kg)   SpO2 94%  BMI 28.34 kg/m   Physical Examination:  General: Well developed, well nourished, NAD  HEENT: OP clear, mucus membranes  moist  SKIN: warm, dry. No rashes. Neuro: No focal deficits  Musculoskeletal: Muscle strength 5/5 all ext  Psychiatric: Mood and affect normal  Neck: No JVD, no carotid bruits, no thyromegaly, no lymphadenopathy.  Lungs:Clear bilaterally, no wheezes, rhonci, crackles Cardiovascular: Regular rate and rhythm. Soft systolic murmur.  Abdomen:Soft. Bowel sounds present. Non-tender.  Extremities: No lower extremity edema. Pulses are 2 + in the bilateral DP/PT.  Cardiac cath 11/27/11:  Left main:  Left Anterior Descending Artery: Large caliber vessel that courses to the apex. There is a patent stent in the proximal to mid LAD with minimal, 20% in stent restenosis. The diagonal branch arises just before the LAD stent. There is a patent stent in the ostium and proximal segment of the diagonal branch with perhaps 20% ostial stenosis.  Circumflex Artery: Moderate sized vessel with no disease noted. Small early OM branch with no disease.  Right Coronary Artery: Large, dominant vessel with patent stent in the proximal vessel. Just before the stent, there is a 20% plaque. Just beyond the stent, there is a 30% plaque.  Left Ventricular Angiogram: LVEF 65-70%.   EKG:  EKG is ordered today. The ekg ordered today demonstrates NSR, rate 71 bpm  Recent Labs: No results found for requested labs within last 8760 hours.   Lipid Panel Followed in primary care   Wt Readings from Last 3 Encounters:  04/23/19 160 lb (72.6 kg)  07/16/18 164 lb (74.4 kg)  03/18/18 165 lb (74.8 kg)     Other studies Reviewed: Additional studies/ records that were reviewed today include: . Review of the above records demonstrates:    Assessment and Plan:   1. CAD without angina: She has had stents placed in the LAD, Diagonal and RCA. Last cath in June 2013 with stable CAD.  No chest pain. Continue ASA, Plavix and statin.   2. HTN: BP is well controlled.   3. HYPERLIPIDEMIA: Lipids followed in primary care. Will continue  statin  4. Carotid artery disease: this is followed in [facility]  5. Cardiac murmur: Will arrange an echo   Current medicines are reviewed at length with the patient today.  The patient does not have concerns regarding medicines.  The following changes have been made:  no change  Labs/ tests ordered today include:   Orders Placed This Encounter  Procedures  . EKG 12-Lead  . ECHOCARDIOGRAM COMPLETE    Disposition:   FU with me in 12  months  Signed, Lauree Chandler, MD 04/23/2019 3:12 PM    [facility] [address], [place], [place]  [address] Phone: [phone]; Fax: [phone]

## 2019-04-23 NOTE — Patient Instructions (Signed)
Medication Instructions:  Your physician recommends that you continue on your current medications as directed. Please refer to the Current Medication list given to you today.  *If you need a refill on your cardiac medications before your next appointment, please call your pharmacy*  Lab Work: None ordered  If you have labs (blood work) drawn today and your tests are completely normal, you will receive your results only by: Marland Kitchen MyChart Message (if you have MyChart) OR . A paper copy in the mail If you have any lab test that is abnormal or we need to change your treatment, we will call you to review the results.  Testing/Procedures: Your physician has requested that you have an echocardiogram. Echocardiography is a painless test that uses sound waves to create images of your heart. It provides your doctor with information about the size and shape of your heart and how well your heart's chambers and valves are working. This procedure takes approximately one hour. There are no restrictions for this procedure.  Follow-Up: At [facility], you and your health needs are our priority.  As part of our continuing mission to provide you with exceptional heart care, we have created designated Provider Care Teams.  These Care Teams include your primary Cardiologist (physician) and Advanced Practice Providers (APPs -  Physician Assistants and Nurse Practitioners) who all work together to provide you with the care you need, when you need it.  Your next appointment:   12 months  The format for your next appointment:   In Person  Provider:   You may see Lauree Chandler, MD or one of the following Advanced Practice Providers on your designated Care Team:    Melina Copa, PA-C  Ermalinda Barrios, PA-C   Other Instructions  Echocardiogram An echocardiogram is a procedure that uses painless sound waves (ultrasound) to produce an image of the heart. Images from an echocardiogram can provide important  information about:  Signs of coronary artery disease (CAD).  Aneurysm detection. An aneurysm is a weak or damaged part of an artery wall that bulges out from the normal force of blood pumping through the body.  Heart size and shape. Changes in the size or shape of the heart can be associated with certain conditions, including heart failure, aneurysm, and CAD.  Heart muscle function.  Heart valve function.  Signs of a past heart attack.  Fluid buildup around the heart.  Thickening of the heart muscle.  A tumor or infectious growth around the heart valves. Tell a health care provider about:  Any allergies you have.  All medicines you are taking, including vitamins, herbs, eye drops, creams, and over-the-counter medicines.  Any blood disorders you have.  Any surgeries you have had.  Any medical conditions you have.  Whether you are pregnant or may be pregnant. What are the risks? Generally, this is a safe procedure. However, problems may occur, including:  Allergic reaction to dye (contrast) that may be used during the procedure. What happens before the procedure? No specific preparation is needed. You may eat and drink normally. What happens during the procedure?   An IV tube may be inserted into one of your veins.  You may receive contrast through this tube. A contrast is an injection that improves the quality of the pictures from your heart.  A gel will be applied to your chest.  A wand-like tool (transducer) will be moved over your chest. The gel will help to transmit the sound waves from the transducer.  The  sound waves will harmlessly bounce off of your heart to allow the heart images to be captured in real-time motion. The images will be recorded on a computer. The procedure may vary among health care providers and hospitals. What happens after the procedure?  You may return to your normal, everyday life, including diet, activities, and medicines, unless your  health care provider tells you not to do that. Summary  An echocardiogram is a procedure that uses painless sound waves (ultrasound) to produce an image of the heart.  Images from an echocardiogram can provide important information about the size and shape of your heart, heart muscle function, heart valve function, and fluid buildup around your heart.  You do not need to do anything to prepare before this procedure. You may eat and drink normally.  After the echocardiogram is completed, you may return to your normal, everyday life, unless your health care provider tells you not to do that. This information is not intended to replace advice given to you by your health care provider. Make sure you discuss any questions you have with your health care provider. Document Released: 05/31/2000 Document Revised: 09/24/2018 Document Reviewed: 07/06/2016 Elsevier Patient Education  2020 [employer].

## 2019-05-04 ENCOUNTER — Ambulatory Visit (HOSPITAL_COMMUNITY): Payer: Medicare Other | Attending: Cardiology

## 2019-05-04 ENCOUNTER — Other Ambulatory Visit: Payer: Self-pay

## 2019-05-04 DIAGNOSIS — R011 Cardiac murmur, unspecified: Secondary | ICD-10-CM | POA: Insufficient documentation

## 2019-06-01 ENCOUNTER — Other Ambulatory Visit: Payer: Self-pay | Admitting: Cardiovascular Disease

## 2019-06-03 ENCOUNTER — Ambulatory Visit: Payer: Medicare Other | Admitting: Cardiovascular Disease

## 2019-06-05 ENCOUNTER — Other Ambulatory Visit: Payer: Self-pay | Admitting: Cardiovascular Disease

## 2019-06-17 ENCOUNTER — Other Ambulatory Visit: Payer: Self-pay | Admitting: Orthopedic Surgery

## 2019-06-23 ENCOUNTER — Other Ambulatory Visit: Payer: Self-pay | Admitting: Orthopedic Surgery

## 2019-06-23 DIAGNOSIS — M25831 Other specified joint disorders, right wrist: Secondary | ICD-10-CM

## 2019-06-23 DIAGNOSIS — M720 Palmar fascial fibromatosis [Dupuytren]: Secondary | ICD-10-CM | POA: Insufficient documentation

## 2019-06-23 DIAGNOSIS — R2233 Localized swelling, mass and lump, upper limb, bilateral: Secondary | ICD-10-CM | POA: Insufficient documentation

## 2019-06-25 ENCOUNTER — Other Ambulatory Visit: Payer: Self-pay | Admitting: Orthopedic Surgery

## 2019-06-25 ENCOUNTER — Ambulatory Visit
Admission: RE | Admit: 2019-06-25 | Discharge: 2019-06-25 | Disposition: A | Discharge status: Home / Self Care | Payer: Medicare PPO | Source: Ambulatory Visit | Attending: Orthopedic Surgery | Admitting: Orthopedic Surgery

## 2019-06-25 DIAGNOSIS — M25831 Other specified joint disorders, right wrist: Secondary | ICD-10-CM

## 2019-06-30 ENCOUNTER — Other Ambulatory Visit: Payer: Self-pay | Admitting: Orthopedic Surgery

## 2019-06-30 ENCOUNTER — Telehealth: Payer: Self-pay | Admitting: *Deleted

## 2019-06-30 NOTE — Telephone Encounter (Signed)
Hi Dr. Angelena Form! Patient has excision of volar cyst on right wrist on 07/13/2019. Can you please comment on how long she can hold Plavix and Aspirin for this? Patient has a history of CAD s/p mutliple PCIs, carotid artery disease followed by [facility], HTN, HLD, and OSA. You last saw her in 04/2019 and she was doing well at that time.  Please route response back to P CV DIV PREOP.  Thank you!

## 2019-06-30 NOTE — Telephone Encounter (Signed)
   Primary Cardiologist: Lauree Chandler, MD  Chart reviewed as part of pre-operative protocol coverage. Called and spoke with patient today and she is doing well. No chest pain, shortness of breath, palpitations, lightheadedness, dizziness, syncope, orthopnea, PND, or significant edema. Able to complete >4.0 METS without any anginal symptoms. Given past medical history and time since last visit, based on ACC/AHA guidelines, [name] would be at acceptable risk for the planned procedure without further cardiovascular testing.  Per Dr. Angelena Form, "OK to hold ASA and Plavix for 7 days prior to her planned surgical procedure." Should resume both as soon as possible after procedure.   I will route this recommendation to the requesting party via Epic fax function and remove from pre-op pool.  Please call with questions.  Darreld Mclean, PA-C 06/30/2019, 2:13 PM

## 2019-06-30 NOTE — Telephone Encounter (Signed)
OK to hold ASA and Plavix for 7 days prior to her planned surgical procedure.   [name]

## 2019-06-30 NOTE — Telephone Encounter (Signed)
Called home number and husband answered and said patient was at a doctor's appointment. Asked him to have her call us back whenever she gets home. He said he would pass along the message.

## 2019-06-30 NOTE — Telephone Encounter (Signed)
   [facility] Pre-operative Risk Assessment    Request for surgical clearance:  1. What type of surgery is being performed? EXCISION VOLAR CYST RIGHT WRIST   2. When is this surgery scheduled? 07/13/19   3. What type of clearance is required (medical clearance vs. Pharmacy clearance to hold med vs. Both)? MEDICAL  4. Are there any medications that need to be held prior to surgery and how long? ASA, PLAVIX   5. Practice name and name of physician performing surgery? [facility]; DR. [place]   6. What is your office phone number [phone]    7.   What is your office fax number [phone] ATTN: Cyndee Brightly, RN  8.   Anesthesia type (None, local, MAC, general) ? IV REGIONAL UPPER   Julaine Hua 06/30/2019, 11:44 AM  _________________________________________________________________   (provider comments below)

## 2019-07-06 ENCOUNTER — Telehealth: Payer: Self-pay | Admitting: Cardiovascular Disease

## 2019-07-06 NOTE — Telephone Encounter (Signed)
We are recommending the COVID-19 vaccine to all of our patients. Cardiac medications (including blood thinners) should not deter anyone from being vaccinated and there is no need to hold any of those medications prior to vaccine administration.     Currently, there is a hotline to call (active 06/25/19) to schedule vaccination appointments as no walk-ins will be accepted.   Number: [phone].    If an appointment is not available please go to [email or web address] to sign up for notification when additional vaccine appointments are available.   If you have further questions or concerns about the vaccine process, please visit [email or web address] or contact your primary care physician.  Patient verbalized understanding. 

## 2019-07-07 ENCOUNTER — Encounter (HOSPITAL_BASED_OUTPATIENT_CLINIC_OR_DEPARTMENT_OTHER): Payer: Self-pay | Admitting: Orthopedic Surgery

## 2019-07-07 ENCOUNTER — Other Ambulatory Visit: Payer: Self-pay

## 2019-07-09 ENCOUNTER — Other Ambulatory Visit (HOSPITAL_COMMUNITY)
Admission: RE | Admit: 2019-07-09 | Discharge: 2019-07-09 | Disposition: A | Discharge status: Home / Self Care | Payer: Medicare PPO | Source: Ambulatory Visit | Attending: Orthopedic Surgery | Admitting: Orthopedic Surgery

## 2019-07-09 ENCOUNTER — Ambulatory Visit: Payer: Medicare PPO

## 2019-07-09 DIAGNOSIS — Z01812 Encounter for preprocedural laboratory examination: Secondary | ICD-10-CM | POA: Diagnosis present

## 2019-07-09 DIAGNOSIS — Z20822 Contact with and (suspected) exposure to covid-19: Secondary | ICD-10-CM | POA: Insufficient documentation

## 2019-07-09 LAB — SARS CORONAVIRUS 2 (TAT 6-24 HRS): SARS Coronavirus 2: NEGATIVE

## 2019-07-09 NOTE — Progress Notes (Signed)

## 2019-07-13 ENCOUNTER — Ambulatory Visit (HOSPITAL_BASED_OUTPATIENT_CLINIC_OR_DEPARTMENT_OTHER): Payer: Medicare PPO | Admitting: Anesthesiology

## 2019-07-13 ENCOUNTER — Encounter (HOSPITAL_BASED_OUTPATIENT_CLINIC_OR_DEPARTMENT_OTHER)
Admission: RE | Disposition: A | Discharge status: Home / Self Care | Payer: Self-pay | Source: Home / Self Care | Attending: Orthopedic Surgery

## 2019-07-13 ENCOUNTER — Ambulatory Visit (HOSPITAL_BASED_OUTPATIENT_CLINIC_OR_DEPARTMENT_OTHER)
Admission: RE | Admit: 2019-07-13 | Discharge: 2019-07-13 | Disposition: A | Discharge status: Home / Self Care | Payer: Medicare PPO | Attending: Orthopedic Surgery | Admitting: Orthopedic Surgery

## 2019-07-13 ENCOUNTER — Encounter (HOSPITAL_BASED_OUTPATIENT_CLINIC_OR_DEPARTMENT_OTHER): Payer: Self-pay | Admitting: Orthopedic Surgery

## 2019-07-13 DIAGNOSIS — G4730 Sleep apnea, unspecified: Secondary | ICD-10-CM | POA: Diagnosis not present

## 2019-07-13 DIAGNOSIS — I739 Peripheral vascular disease, unspecified: Secondary | ICD-10-CM | POA: Insufficient documentation

## 2019-07-13 DIAGNOSIS — K5790 Diverticulosis of intestine, part unspecified, without perforation or abscess without bleeding: Secondary | ICD-10-CM | POA: Insufficient documentation

## 2019-07-13 DIAGNOSIS — Z86010 Personal history of colonic polyps: Secondary | ICD-10-CM | POA: Diagnosis not present

## 2019-07-13 DIAGNOSIS — Z955 Presence of coronary angioplasty implant and graft: Secondary | ICD-10-CM | POA: Diagnosis not present

## 2019-07-13 DIAGNOSIS — Z8249 Family history of ischemic heart disease and other diseases of the circulatory system: Secondary | ICD-10-CM | POA: Diagnosis not present

## 2019-07-13 DIAGNOSIS — Z7982 Long term (current) use of aspirin: Secondary | ICD-10-CM | POA: Diagnosis not present

## 2019-07-13 DIAGNOSIS — M722 Plantar fascial fibromatosis: Secondary | ICD-10-CM | POA: Diagnosis not present

## 2019-07-13 DIAGNOSIS — Z79899 Other long term (current) drug therapy: Secondary | ICD-10-CM | POA: Diagnosis not present

## 2019-07-13 DIAGNOSIS — I10 Essential (primary) hypertension: Secondary | ICD-10-CM | POA: Diagnosis not present

## 2019-07-13 DIAGNOSIS — M71331 Other bursal cyst, right wrist: Secondary | ICD-10-CM | POA: Insufficient documentation

## 2019-07-13 DIAGNOSIS — Z7902 Long term (current) use of antithrombotics/antiplatelets: Secondary | ICD-10-CM | POA: Insufficient documentation

## 2019-07-13 DIAGNOSIS — I2510 Atherosclerotic heart disease of native coronary artery without angina pectoris: Secondary | ICD-10-CM | POA: Diagnosis not present

## 2019-07-13 DIAGNOSIS — R2231 Localized swelling, mass and lump, right upper limb: Secondary | ICD-10-CM | POA: Diagnosis present

## 2019-07-13 DIAGNOSIS — E785 Hyperlipidemia, unspecified: Secondary | ICD-10-CM | POA: Diagnosis not present

## 2019-07-13 HISTORY — PX: MASS EXCISION: SHX2000

## 2019-07-13 SURGERY — EXCISION MASS
Anesthesia: Monitor Anesthesia Care | Site: Wrist | Laterality: Right

## 2019-07-13 MED ORDER — FENTANYL CITRATE (PF) 100 MCG/2ML IJ SOLN: 25.0000 ug | INTRAMUSCULAR | Status: DC | PRN

## 2019-07-13 MED ORDER — FENTANYL CITRATE (PF) 100 MCG/2ML IJ SOLN
50.0000 ug | INTRAMUSCULAR | Status: DC | PRN
  Administered 2019-07-13 (×2): 50 ug via INTRAVENOUS

## 2019-07-13 MED ORDER — PROPOFOL 500 MG/50ML IV EMUL
INTRAVENOUS | Status: DC | PRN
  Administered 2019-07-13: 50 ug/kg/min via INTRAVENOUS

## 2019-07-13 MED ORDER — TRAMADOL HCL 50 MG PO TABS: 50.0000 mg | ORAL_TABLET | Freq: Four times a day (QID) | ORAL | 0 refills | Status: DC | PRN

## 2019-07-13 MED ORDER — LACTATED RINGERS IV SOLN: INTRAVENOUS | Status: DC

## 2019-07-13 MED ORDER — FENTANYL CITRATE (PF) 100 MCG/2ML IJ SOLN
INTRAMUSCULAR | Status: AC
  Filled 2019-07-13: qty 2

## 2019-07-13 MED ORDER — MIDAZOLAM HCL 2 MG/2ML IJ SOLN: 1.0000 mg | INTRAMUSCULAR | Status: DC | PRN

## 2019-07-13 MED ORDER — PROPOFOL 10 MG/ML IV BOLUS
INTRAVENOUS | Status: AC
  Filled 2019-07-13: qty 20

## 2019-07-13 MED ORDER — ONDANSETRON HCL 4 MG/2ML IJ SOLN
INTRAMUSCULAR | Status: AC
  Filled 2019-07-13: qty 2

## 2019-07-13 MED ORDER — CEFAZOLIN SODIUM-DEXTROSE 2-4 GM/100ML-% IV SOLN
INTRAVENOUS | Status: AC
  Filled 2019-07-13: qty 100

## 2019-07-13 MED ORDER — CHLORHEXIDINE GLUCONATE 4 % EX LIQD: 60.0000 mL | Freq: Once | CUTANEOUS | Status: DC

## 2019-07-13 MED ORDER — LIDOCAINE HCL (PF) 0.5 % IJ SOLN
INTRAMUSCULAR | Status: DC | PRN
  Administered 2019-07-13: 50 mL via INTRAVENOUS

## 2019-07-13 MED ORDER — PROPOFOL 10 MG/ML IV BOLUS
INTRAVENOUS | Status: DC | PRN
  Administered 2019-07-13: 20 mg via INTRAVENOUS

## 2019-07-13 MED ORDER — BUPIVACAINE HCL (PF) 0.25 % IJ SOLN
INTRAMUSCULAR | Status: DC | PRN
  Administered 2019-07-13: 8 mL

## 2019-07-13 MED ORDER — CEFAZOLIN SODIUM-DEXTROSE 2-4 GM/100ML-% IV SOLN
2.0000 g | INTRAVENOUS | Status: AC
  Administered 2019-07-13: 2 g via INTRAVENOUS

## 2019-07-13 SURGICAL SUPPLY — 42 items
BLADE SURG 15 STRL LF DISP TIS (BLADE) ×1 IMPLANT
BLADE SURG 15 STRL SS (BLADE) ×1
BNDG COHESIVE 1X5 TAN STRL LF (GAUZE/BANDAGES/DRESSINGS) IMPLANT
BNDG COHESIVE 2X5 TAN STRL LF (GAUZE/BANDAGES/DRESSINGS) IMPLANT
BNDG COHESIVE 3X5 TAN STRL LF (GAUZE/BANDAGES/DRESSINGS) ×2 IMPLANT
BNDG ESMARK 4X9 LF (GAUZE/BANDAGES/DRESSINGS) ×2 IMPLANT
BNDG GAUZE ELAST 4 BULKY (GAUZE/BANDAGES/DRESSINGS) ×2 IMPLANT
CHLORAPREP W/TINT 26 (MISCELLANEOUS) ×2 IMPLANT
CORD BIPOLAR FORCEPS 12FT (ELECTRODE) ×2 IMPLANT
COVER BACK TABLE 60X90IN (DRAPES) ×2 IMPLANT
COVER MAYO STAND STRL (DRAPES) ×2 IMPLANT
COVER WAND RF STERILE (DRAPES) IMPLANT
DECANTER SPIKE VIAL GLASS SM (MISCELLANEOUS) IMPLANT
DRAIN PENROSE 1/2X12 LTX STRL (WOUND CARE) IMPLANT
DRAPE EXTREMITY T 121X128X90 (DISPOSABLE) ×2 IMPLANT
DRAPE SURG 17X23 STRL (DRAPES) ×2 IMPLANT
DRSG PAD ABDOMINAL 8X10 ST (GAUZE/BANDAGES/DRESSINGS) ×2 IMPLANT
GAUZE SPONGE 4X4 12PLY STRL (GAUZE/BANDAGES/DRESSINGS) ×2 IMPLANT
GAUZE XEROFORM 1X8 LF (GAUZE/BANDAGES/DRESSINGS) ×2 IMPLANT
GLOVE BIOGEL PI IND STRL 8.5 (GLOVE) ×1 IMPLANT
GLOVE BIOGEL PI INDICATOR 8.5 (GLOVE) ×1
GLOVE SURG ORTHO 8.0 STRL STRW (GLOVE) ×2 IMPLANT
GOWN STRL REUS W/ TWL LRG LVL3 (GOWN DISPOSABLE) ×2 IMPLANT
GOWN STRL REUS W/TWL LRG LVL3 (GOWN DISPOSABLE) ×2
GOWN STRL REUS W/TWL XL LVL3 (GOWN DISPOSABLE) ×2 IMPLANT
NDL SAFETY ECLIPSE 18X1.5 (NEEDLE) ×1 IMPLANT
NEEDLE HYPO 18GX1.5 SHARP (NEEDLE) ×1
NEEDLE PRECISIONGLIDE 27X1.5 (NEEDLE) ×2 IMPLANT
NS IRRIG 1000ML POUR BTL (IV SOLUTION) ×2 IMPLANT
PACK BASIN DAY SURGERY FS (CUSTOM PROCEDURE TRAY) ×2 IMPLANT
PAD CAST 3X4 CTTN HI CHSV (CAST SUPPLIES) ×1 IMPLANT
PADDING CAST COTTON 3X4 STRL (CAST SUPPLIES) ×1
SLING ARM FOAM STRAP MED (SOFTGOODS) ×2 IMPLANT
SPLINT PLASTER CAST XFAST 3X15 (CAST SUPPLIES) IMPLANT
SPLINT PLASTER XTRA FASTSET 3X (CAST SUPPLIES)
STOCKINETTE 4X48 STRL (DRAPES) ×2 IMPLANT
SUT ETHILON 4 0 PS 2 18 (SUTURE) ×2 IMPLANT
SUT VIC AB 4-0 P2 18 (SUTURE) IMPLANT
SYR BULB 3OZ (MISCELLANEOUS) ×2 IMPLANT
SYR CONTROL 10ML LL (SYRINGE) ×2 IMPLANT
TOWEL GREEN STERILE FF (TOWEL DISPOSABLE) ×2 IMPLANT
UNDERPAD 30X36 HEAVY ABSORB (UNDERPADS AND DIAPERS) ×2 IMPLANT

## 2019-07-13 NOTE — Brief Op Note (Signed)
07/13/2019  10:25 AM  PATIENT:  [name]  79 y.o. female  PRE-OPERATIVE DIAGNOSIS:  VOLAR CYST RIGHT WRIST  POST-OPERATIVE DIAGNOSIS:  VOLAR CYST RIGHT WRIST  PROCEDURE:  Procedure(s) with comments: EXCISION VOLAR CYST RIGHT WRIST (Right) - IV REGIONAL FOREARM BLOCK  SURGEON:  Surgeon(s) and Role:    * Daryll Brod, MD - Primary  PHYSICIAN ASSISTANT:   ASSISTANTS: none   ANESTHESIA:   local, regional and IV sedation  EBL:  67ml  BLOOD ADMINISTERED:none  DRAINS: none   LOCAL MEDICATIONS USED:  BUPIVICAINE   SPECIMEN:  Excision  DISPOSITION OF SPECIMEN:  PATHOLOGY  COUNTS:  YES  TOURNIQUET:   Total Tourniquet Time Documented: Upper Arm (Right) - 33 minutes Total: Upper Arm (Right) - 33 minutes   DICTATION: .Viviann Spare Dictation  PLAN OF CARE: Discharge to home after PACU  PATIENT DISPOSITION:  PACU - hemodynamically stable.

## 2019-07-13 NOTE — Op Note (Signed)
NAME: [name] MEDICAL RECORD NO: [id number] DATE OF BIRTH: 1940-12-16 FAC[facility] LOCATION: [facility] PHYSICIAN: Wynonia Sours, MD   OPERATIVE REPORT   DATE OF PROCEDURE: 07/13/19    PREOPERATIVE DIAGNOSIS:   Mass volar radial right wrist   POSTOPERATIVE DIAGNOSIS:   Same  PROCEDURE: Excision volar radial wrist ganglion right wrist  SURGEON: Daryll Brod, M.D.   ASSISTANT: none   ANESTHESIA:  Regional with sedation with local  INTRAVENOUS FLUIDS:  Per anesthesia flow sheet.   ESTIMATED BLOOD LOSS:  Minimal.   COMPLICATIONS:  None.   SPECIMENS:   Cyst   TOURNIQUET TIME:    Total Tourniquet Time Documented: Upper Arm (Right) - 33 minutes Total: Upper Arm (Right) - 33 minutes    DISPOSITION:  Stable to PACU.   INDICATIONS: Patient is a 79 year old female with a history of a volar radial wrist ganglion confirmed with ultrasound.  She states this is bothersome for is elected to undergo surgical excision.  Preperi-and postoperative course been discussed along with risks and complications.  She is aware that there is no guarantee to the surgery the possibility of infection recurrence injury to arteries nerves tendons incomplete relief of symptoms dystrophy.  Preoperative area the patient seen the extremity marked by both patient and surgeon antibiotic given.    OPERATIVE COURSE: Patient is brought to the operating room where an upper arm IV regional anesthetic was carried out without difficulty under the direction of the anesthesia department.  She was prepped using ChloraPrep and in supine position with the right arm free.  A 3-minute dry time was allowed and timeout taken confirming patient procedure.  A curvilinear incision was made over the mass on the volar radial aspect right wrist carried down through subcutaneous tissue.  Bleeders were electrocauterized with bipolar.  The dissection immediately encountered a mass on the flexor carpi radialis  tendon sheath.  The radial artery was identified protected throughout the procedure.  The cyst was then isolated with blunt dissection.  This was found to rise most likely from the midcarpal joint.  The cyst was deflated.  There was no communication with the radiocarpal joint.  The dissection was carried distally along the flexor carpi radialis tendon sheath with retraction a small rondure was then used to remove any cyst proximally.  The specimen was sent to pathology.  The egressed from the midcarpal joint was not able to be easily visualized.  Bleeders were again electrocauterized for hemostasis the wound irrigated the subcutaneous closed tissue closed with interrupted 4-0 Vicryl sutures and the skin with interrupted 4-0 nylon sutures.  A sterile compressive dressing was applied with the reinforcement of volarly for stabilization.  Patient tolerated procedure well was taken to the recovery room for observation in satisfactory condition.  She will be discharged home to return to the hand center [facility] in 1 week on Tylenol ibuprofen for pain with Ultram for backup breakthrough.   Daryll Brod, MD Electronically signed, 07/13/19

## 2019-07-13 NOTE — Anesthesia Preprocedure Evaluation (Addendum)
Anesthesia Evaluation  Patient identified by MRN, date of birth, ID band Patient awake    Reviewed: Allergy & Precautions, NPO status , Patient's Chart, lab work & pertinent test results  History of Anesthesia Complications Negative for: history of anesthetic complications  Airway Mallampati: II  TM Distance: >3 FB Neck ROM: Full    Dental  (+) Dental Advisory Given   Pulmonary neg pulmonary ROS,  07/09/2019 SARS coronavirus NEG   breath sounds clear to auscultation       Cardiovascular hypertension, Pt. on medications (-) angina+ CAD and + Cardiac Stents   Rhythm:Regular Rate:Normal  04/2019 ECHO: EF 60-65%, valves OK   Neuro/Psych negative neurological ROS     GI/Hepatic negative GI ROS, Neg liver ROS,   Endo/Other  negative endocrine ROS  Renal/GU negative Renal ROS     Musculoskeletal   Abdominal   Peds  Hematology negative hematology ROS (+)   Anesthesia Other Findings   Reproductive/Obstetrics                           Anesthesia Physical Anesthesia Plan  ASA: III  Anesthesia Plan: Bier Block and MAC and Bier Block-LIDOCAINE ONLY   Post-op Pain Management:    Induction:   PONV Risk Score and Plan: 2 and Ondansetron and Dexamethasone  Airway Management Planned: Natural Airway and Simple Face Mask  Additional Equipment:   Intra-op Plan:   Post-operative Plan:   Informed Consent: I have reviewed the patients History and Physical, chart, labs and discussed the procedure including the risks, benefits and alternatives for the proposed anesthesia with the patient or authorized representative who has indicated his/her understanding and acceptance.     Dental advisory given  Plan Discussed with: CRNA and Surgeon  Anesthesia Plan Comments:         Anesthesia Quick Evaluation

## 2019-07-13 NOTE — Transfer of Care (Signed)
Immediate Anesthesia Transfer of Care Note  Patient: ALEZA PEW  Procedure(s) Performed: EXCISION VOLAR CYST RIGHT WRIST (Right Wrist)  Patient Location: PACU  Anesthesia Type:MAC and Bier block  Level of Consciousness: awake, alert  and oriented  Airway & Oxygen Therapy: Patient Spontanous Breathing  Post-op Assessment: Report given to RN and Post -op Vital signs reviewed and stable  Post vital signs: Reviewed and stable  Last Vitals:  Vitals Value Taken Time  BP 134/98 07/13/19 1030  Temp    Pulse 63 07/13/19 1032  Resp 19 07/13/19 1032  SpO2 97 % 07/13/19 1032  Vitals shown include unvalidated device data.  Last Pain:  Vitals:   07/13/19 0817  TempSrc: Temporal  PainSc: 3          Complications: No apparent anesthesia complications

## 2019-07-13 NOTE — H&P (Signed)
[name] is an 79 y.o. female.   Chief Complaint: mass right wrist [id number]  is a 79 year old female comes in complaining of with a mass on the volar radial aspect of her right wrist and a mass in the palm of her left hand small finger with the inability to extend it. The mass on the radial aspect right side is mildly aching in nature. Is been present since April. She recalls no history of injury.She states that it is bothering her gets in her way she has a feeling of discomfort.The mass on her left small finger has been present for 8 to 10 years. She is of [place] and descent. Her family has not had similar problems in either siblings or parents. She has had plantar fasciitis which has apparently recurred following treatment. He has a history of arthritis no history of diabetes thyroid problems or gout. Family history is positive arthritis negative for the remainder.    Past Medical History:  Diagnosis Date  . CAD (coronary artery disease)    a. DES to LAD, diagonal 10/2006. b. DES to RCA 05/2007. c. Last cath 11/2012: patent stents, stable dz. d. Carlton Adam cardiolite negative 12/2012.  . Cataract    left eye  . Diverticulitis   . Diverticulosis of colon   . Hemorrhoids   . History of recurrent UTIs   . Hx of adenomatous colonic polyps    tubular  . Hyperlipidemia   . Hypertension   . Peripheral vascular disease ([place])   . Sleep apnea    wears CPAP    Past Surgical History:  Procedure Laterality Date  . ANGIOPLASTY  5/08  and  12/08    stent x3  . Cataract left    . EYE SURGERY    . FOOT TENDON SURGERY Left Sept. 20, 2016    Left Calf / foot -   Dr. Sharol Given  . Right shoulder surgery    . Vitrectomy left eye      Family History  Problem Relation Age of Onset  . Colon polyps Brother   . Heart disease Brother        before age 109  . Cancer Brother   . Hyperlipidemia Brother   . Heart attack Brother   . Heart disease Father        before age 30  . Deep vein  thrombosis Father   . Hyperlipidemia Father   . Heart attack Father 73  . Heart disease Brother   . Stroke Mother        TIA's  . Colon polyps Paternal Grandfather   . Heart disease Other        Grandmother   . Colon cancer Neg Hx   . Esophageal cancer Neg Hx   . Stomach cancer Neg Hx   . Rectal cancer Neg Hx    Social History:  reports that she has never smoked. She has never used smokeless tobacco. She reports current alcohol use of about 2.0 - 3.0 standard drinks of alcohol per week. She reports that she does not use drugs.  Allergies:  Allergies  Allergen Reactions  . Fluoride Preparations Other (See Comments)    A test for eye to make pictures, (  causing pressure of Left arm )    No medications prior to admission.    No results found for this or any previous visit (from the past 48 hour(s)).  No results found.   Pertinent items are noted in HPI.  Height  5\' 3"  (1.6 m), weight 72.6 kg.  General appearance: alert, cooperative and appears stated age Head: Normocephalic, without obvious abnormality Neck: no JVD Resp: clear to auscultation bilaterally Cardio: regular rate and rhythm, S1, S2 normal, no murmur, click, rub or gallop GI: soft, non-tender; bowel sounds normal; no masses,  no organomegaly Extremities: mass right wrist Pulses: 2+ and symmetric Skin: Skin color, texture, turgor normal. No rashes or lesions Neurologic: Grossly normal Incision/Wound: na  Assessment/Plan  Assessment:  1. Mass of joint of right wrist    Plan: We have discussed with her the ultrasound. This is reviewed and we have discussed that the chart obviously was mislabeled. She would like to have the cyst removed. Preperi-and postoperative course been discussed along with risk and complications. She is aware there is no guarantee to the surgery the possibility of infection recurrence injury to arteries nerves tendons incomplete relief symptoms dystrophy. She is advised that the pain  may be coming from the carpometacarpal joint of her thumb rather than from the cyst. She is advised to 20% recurrence right volar wrist ganglions. She is scheduled as an outpatient for excision of right wrist volar ganglion. This will be done as an outpatient under regional anesthesia. Questions are encouraged and answered to her satisfaction. She does not want anything done to the left Dupuytren's at the present time.    Daryll Brod 07/13/2019, 5:17 AM

## 2019-07-13 NOTE — Anesthesia Postprocedure Evaluation (Addendum)
Anesthesia Post Note  Patient: [name]  Procedure(s) Performed: EXCISION VOLAR CYST RIGHT WRIST (Right Wrist)     Patient location during evaluation: Phase II Anesthesia Type: MAC Level of consciousness: awake and alert, patient cooperative and oriented Pain management: pain level controlled Vital Signs Assessment: post-procedure vital signs reviewed and stable Respiratory status: spontaneous breathing, respiratory function stable and nonlabored ventilation Cardiovascular status: blood pressure returned to baseline and stable Postop Assessment: no apparent nausea or vomiting and adequate PO intake Anesthetic complications: no    Last Vitals:  Vitals:   07/13/19 1030 07/13/19 1050  BP: (!) 134/98 139/76  Pulse: 70 (!) 56  Resp: 17 18  Temp: 36.6 C 36.6 C  SpO2: 98% 100%    Last Pain:  Vitals:   07/13/19 1050  TempSrc:   PainSc: 0-No pain                 Tidus Upchurch,E. Ammy Lienhard

## 2019-07-13 NOTE — Anesthesia Procedure Notes (Signed)
Procedure Name: MAC Date/Time: 07/13/2019 9:49 AM Performed by: Maryella Shivers, CRNA Pre-anesthesia Checklist: Patient identified, Emergency Drugs available, Suction available, Patient being monitored and Timeout performed Patient Re-evaluated:Patient Re-evaluated prior to induction Oxygen Delivery Method: Simple face mask

## 2019-07-13 NOTE — Anesthesia Procedure Notes (Signed)
Anesthesia Regional Block: Bier block (IV Regional)   Pre-Anesthetic Checklist: ,, timeout performed, Correct Patient, Correct Site, Correct Laterality, Correct Procedure,, site marked, surgical consent,, at surgeon's request  Laterality: Right     Needles:  Injection technique: Single-shot  Needle Type: Other      Needle Gauge: 20     Additional Needles:   Procedures:,,,,, intact distal pulses, Esmarch exsanguination,, double tourniquet utilized  Narrative:   Performed by: Personally       

## 2019-07-13 NOTE — Discharge Instructions (Signed)

## 2019-07-14 ENCOUNTER — Encounter: Payer: Self-pay | Admitting: *Deleted

## 2019-07-14 ENCOUNTER — Ambulatory Visit: Payer: Medicare PPO

## 2019-07-14 LAB — SURGICAL PATHOLOGY

## 2019-07-21 ENCOUNTER — Encounter (HOSPITAL_COMMUNITY): Payer: Medicare Other

## 2019-08-05 ENCOUNTER — Encounter (HOSPITAL_COMMUNITY): Payer: Medicare PPO

## 2019-08-05 ENCOUNTER — Ambulatory Visit: Payer: Self-pay

## 2019-08-20 DIAGNOSIS — Z889 Allergy status to unspecified drugs, medicaments and biological substances status: Secondary | ICD-10-CM | POA: Insufficient documentation

## 2019-08-24 ENCOUNTER — Other Ambulatory Visit: Payer: Self-pay | Admitting: *Deleted

## 2019-08-24 DIAGNOSIS — I6523 Occlusion and stenosis of bilateral carotid arteries: Secondary | ICD-10-CM

## 2019-08-24 NOTE — Progress Notes (Addendum)
HISTORY AND PHYSICAL     CC:  follow up. Requesting Provider:  Shon Baton, MD  HPI: This is a 79 y.o. female here for follow up for carotid artery stenosis.  Pt has hx of right ICA stenosis of 60-79% and < 40% left ICA stenosis.   Pt was last seen 07/16/2018 and at that time pt was asymptomatic.  She has hx of cardiac stents.  She remains asymptomatic. She denies any symptoms of TIA, amaurosis or stroke  She denies any new medical problems since her last visit   The pt is on a statin for cholesterol management.  The pt is on a daily aspirin.   Other AC:  Plavix The pt is on CCB for hypertension.   The pt is not diabetic.   Tobacco hx:  never   Past Medical History:  Diagnosis Date  . CAD (coronary artery disease)    a. DES to LAD, diagonal 10/2006. b. DES to RCA 05/2007. c. Last cath 11/2012: patent stents, stable dz. d. Carlton Adam cardiolite negative 12/2012.  . Cataract    left eye  . Diverticulitis   . Diverticulosis of colon   . Hemorrhoids   . History of recurrent UTIs   . Hx of adenomatous colonic polyps    tubular  . Hyperlipidemia   . Hypertension   . Peripheral vascular disease ([place])   . Sleep apnea    wears CPAP    Past Surgical History:  Procedure Laterality Date  . ANGIOPLASTY  5/08  and  12/08    stent x3  . Cataract left    . EYE SURGERY    . FOOT TENDON SURGERY Left Sept. 20, 2016    Left Calf / foot -   Dr. Sharol Given  . MASS EXCISION Right 07/13/2019   Procedure: EXCISION VOLAR CYST RIGHT WRIST;  Surgeon: Daryll Brod, MD;  Location: [facility];  Service: Orthopedics;  Laterality: Right;  IV REGIONAL FOREARM BLOCK  . Right shoulder surgery    . Vitrectomy left eye      Allergies  Allergen Reactions  . Fluoride Preparations Other (See Comments)    A test for eye to make pictures, (  causing pressure of Left arm )    Current Outpatient Medications  Medication Sig Dispense Refill  . amLODipine (NORVASC) 2.5 MG tablet TAKE 1 TABLET BY  MOUTH EVERY DAY 90 tablet 3  . aspirin 81 MG tablet Take 1 tablet (81 mg total) by mouth daily.    . cholecalciferol (VITAMIN D) 1000 UNITS tablet Take 1,000 Units by mouth daily.    . clopidogrel (PLAVIX) 75 MG tablet TAKE 1 TABLET BY MOUTH EVERY DAY 90 tablet 3  . fish oil-omega-3 fatty acids 1000 MG capsule Take 1 g by mouth 2 (two) times daily.    . fluticasone (FLONASE) 50 MCG/ACT nasal spray Place 1 spray into both nostrils as needed for allergies.   2  . Multiple Vitamin (MULTIVITAMIN WITH MINERALS) TABS Take 1 tablet by mouth daily.    . nitroGLYCERIN (NITROSTAT) 0.4 MG SL tablet Place 1 tablet (0.4 mg total) under the tongue every 5 (five) minutes as needed for chest pain. 25 tablet 5  . Psyllium (METAMUCIL) 30.9 % POWD Take by mouth 1 dose over 24 hours.      . rosuvastatin (CRESTOR) 40 MG tablet TAKE 1 TABLET BY MOUTH EVERY OTHER DAY 45 tablet 3  . traMADol (ULTRAM) 50 MG tablet Take 1 tablet (50 mg total) by mouth  every 6 (six) hours as needed. 20 tablet 0  . vitamin B-12 (CYANOCOBALAMIN) 1000 MCG tablet Take 1,000 mcg by mouth daily.    . vitamin C (ASCORBIC ACID) 500 MG tablet Take 500 mg by mouth daily.    Marland Kitchen zinc gluconate 50 MG tablet Take 50 mg by mouth every other day.     No current facility-administered medications for this visit.    Family History  Problem Relation Age of Onset  . Colon polyps Brother   . Heart disease Brother        before age 33  . Cancer Brother   . Hyperlipidemia Brother   . Heart attack Brother   . Heart disease Father        before age 53  . Deep vein thrombosis Father   . Hyperlipidemia Father   . Heart attack Father 73  . Heart disease Brother   . Stroke Mother        TIA's  . Colon polyps Paternal Grandfather   . Heart disease Other        Grandmother   . Colon cancer Neg Hx   . Esophageal cancer Neg Hx   . Stomach cancer Neg Hx   . Rectal cancer Neg Hx     Social History   Socioeconomic History  . Marital status: Married     Spouse name: Not on file  . Number of children: 0  . Years of education: Not on file  . Highest education level: Not on file  Occupational History  . Occupation: retired     Fish farm manager: RETIRED  Tobacco Use  . Smoking status: Never Smoker  . Smokeless tobacco: Never Used  Substance and Sexual Activity  . Alcohol use: Yes    Alcohol/week: 2.0 - 3.0 standard drinks    Types: 2 - 3 Standard drinks or equivalent per week  . Drug use: No  . Sexual activity: Not on file  Other Topics Concern  . Not on file  Social History Narrative  . Not on file   Social Determinants of Health   Financial Resource Strain:   . Difficulty of Paying Living Expenses: Not on file  Food Insecurity:   . Worried About Charity fundraiser in the Last Year: Not on file  . Ran Out of Food in the Last Year: Not on file  Transportation Needs:   . Lack of Transportation (Medical): Not on file  . Lack of Transportation (Non-Medical): Not on file  Physical Activity:   . Days of Exercise per Week: Not on file  . Minutes of Exercise per Session: Not on file  Stress:   . Feeling of Stress : Not on file  Social Connections:   . Frequency of Communication with Friends and Family: Not on file  . Frequency of Social Gatherings with Friends and Family: Not on file  . Attends Religious Services: Not on file  . Active Member of Clubs or Organizations: Not on file  . Attends Archivist Meetings: Not on file  . Marital Status: Not on file  Intimate Partner Violence:   . Fear of Current or Ex-Partner: Not on file  . Emotionally Abused: Not on file  . Physically Abused: Not on file  . Sexually Abused: Not on file     REVIEW OF SYSTEMS:   [X]  denotes positive finding, [ ]  denotes negative finding Cardiac  Comments:  Chest pain or chest pressure:    Shortness of breath upon exertion:  Short of breath when lying flat:    Irregular heart rhythm:        Vascular    Pain in calf, thigh, or hip brought  on by ambulation:    Pain in feet at night that wakes you up from your sleep:     Blood clot in your veins:    Leg swelling:     Cramping in legs:  X Occurs often at night and when sitting  Pulmonary    Oxygen at home:    Productive cough:     Wheezing:         Neurologic    Sudden weakness in arms or legs:     Sudden numbness in arms or legs:     Sudden onset of difficulty speaking or slurred speech:    Temporary loss of vision in one eye:     Problems with dizziness:         Gastrointestinal    Blood in stool:     Vomited blood:         Genitourinary    Burning when urinating:     Blood in urine:        Psychiatric    Major depression:         Hematologic    Bleeding problems:    Problems with blood clotting too easily:        Skin    Rashes or ulcers:        Constitutional    Fever or chills:     Vitals:   08/25/19 1553 08/25/19 1558  BP: 140/78 (!) 150/82  Pulse: 77 77  Resp: 16   Temp: 98 F (36.7 C)   SpO2: 96%     PHYSICAL EXAMINATION: General:  WDWN in NAD; vital signs documented above Gait: normal HENT: WNL, normocephalic Pulmonary: normal non-labored breathing , without Rales, rhonchi,  wheezing Cardiac: regular HR, without  Murmurs, rubs or gallops; without carotid bruit Abdomen: soft, NT, no masses Skin: without rashes Vascular Exam/Pulses:  Right Left  Radial 2+ (normal) 2+ (normal)  Ulnar 2+ (normal) 2+ (normal)  Femoral 2+ (normal) 2+ (normal)  Popliteal 2+ (normal) 2+ (normal)  DP 2+ (normal) 2+ (normal)  PT 2+ (normal) 2+ (normal)   Extremities: without ischemic changes, without Gangrene , without cellulitis; without open wounds;  Musculoskeletal: no muscle wasting or atrophy  Neurologic: A&O X 3 Psychiatric:  The pt has Normal affect.   Non-Invasive Vascular Imaging:   Carotid Duplex on 08/25/2019: Right:  60-79 % ICA stenosis Left:  1-39% ICA stenosis Bilateral vertebral arteries demonstrate antegrade flow Normal flow  hemodynamics were seen in bilateral subclavian arteries  Previous Carotid duplex on 07/16/2018: Right: 40-59% ICA stenosis Left:   1-39% ICA stenosis Vertebrals: Bilateral vertebral arteries demonstrate antegrade flow. Subclavians: Normal flow hemodynamics were seen in bilateral subclavianarteries.   ASSESSMENT/PLAN:: 79 y.o. female here for follow up carotid artery stenosis. She remains asymptomatic. Her carotid duplex today is similar to her duplex in 2019 but compared to her 2020 duplex her Right ICA stenosis has increased some. I recommend that she follow up in 6 months for repeat duplex  -She will continue her Plavix, aspirin and Statin -discussed s/s of stroke with pt and they understand should they develop any of these sx, they will go to the nearest ER. - she will follow up in 6 months with carotid duplex  Paulo Fruit, PA-C [facility] [phone]  Clinic MD:  Scot Dock

## 2019-08-25 ENCOUNTER — Ambulatory Visit (HOSPITAL_COMMUNITY)
Admission: RE | Admit: 2019-08-25 | Discharge: 2019-08-25 | Disposition: A | Discharge status: Home / Self Care | Payer: Medicare PPO | Source: Ambulatory Visit | Attending: Vascular Surgery | Admitting: Vascular Surgery

## 2019-08-25 ENCOUNTER — Other Ambulatory Visit: Payer: Self-pay

## 2019-08-25 ENCOUNTER — Ambulatory Visit (INDEPENDENT_AMBULATORY_CARE_PROVIDER_SITE_OTHER): Payer: Medicare PPO | Admitting: Physician Assistant

## 2019-08-25 VITALS — BP 150/82 | HR 77 | Temp 98.0°F | Resp 16 | Ht 63.0 in | Wt 160.0 lb

## 2019-08-25 DIAGNOSIS — I6523 Occlusion and stenosis of bilateral carotid arteries: Secondary | ICD-10-CM

## 2019-08-26 ENCOUNTER — Other Ambulatory Visit: Payer: Self-pay | Admitting: *Deleted

## 2019-08-26 DIAGNOSIS — I6523 Occlusion and stenosis of bilateral carotid arteries: Secondary | ICD-10-CM

## 2019-09-23 ENCOUNTER — Ambulatory Visit: Payer: Medicare PPO | Admitting: Physician Assistant

## 2019-09-23 ENCOUNTER — Telehealth: Payer: Self-pay | Admitting: Cardiovascular Disease

## 2019-09-23 ENCOUNTER — Encounter: Payer: Self-pay | Admitting: *Deleted

## 2019-09-23 ENCOUNTER — Other Ambulatory Visit: Payer: Self-pay

## 2019-09-23 ENCOUNTER — Encounter: Payer: Self-pay | Admitting: Physician Assistant

## 2019-09-23 VITALS — BP 130/70 | HR 84 | Ht 63.0 in | Wt 161.6 lb

## 2019-09-23 DIAGNOSIS — I6523 Occlusion and stenosis of bilateral carotid arteries: Secondary | ICD-10-CM

## 2019-09-23 DIAGNOSIS — I2510 Atherosclerotic heart disease of native coronary artery without angina pectoris: Secondary | ICD-10-CM | POA: Diagnosis not present

## 2019-09-23 DIAGNOSIS — R011 Cardiac murmur, unspecified: Secondary | ICD-10-CM

## 2019-09-23 DIAGNOSIS — R079 Chest pain, unspecified: Secondary | ICD-10-CM

## 2019-09-23 DIAGNOSIS — M25512 Pain in left shoulder: Secondary | ICD-10-CM

## 2019-09-23 DIAGNOSIS — E7800 Pure hypercholesterolemia, unspecified: Secondary | ICD-10-CM

## 2019-09-23 DIAGNOSIS — R0789 Other chest pain: Secondary | ICD-10-CM

## 2019-09-23 DIAGNOSIS — I10 Essential (primary) hypertension: Secondary | ICD-10-CM

## 2019-09-23 NOTE — Telephone Encounter (Signed)
Outside this am, doing yard work -Left arm and shoulder started hurting.  Very painful.  Felt very weak.  Sat down in chair.  Pain eased.  Went in and took 1 ntg, pain subsided gradually.  This has occurred 3 times since first of March.      No nausea, SOB, no diaphoresis or chest pain.  BP  120/64, 113/63 today  According to orthopedist no reason she should have trouble in left arm and shoulder.  Digging with shovel, using a pole saw and putting out pine straw each of the times symptoms have occurred.   Has history of this according to chart, had left arm pain prior to previous intervention.  Scheduled as add on with V. Bhagat this afternoon.

## 2019-09-23 NOTE — Telephone Encounter (Signed)
Patient calling stating she has been having pain in her left arm and shoulder. She states she is not having any chest pain, nausea, or SOB and has not checked her BP. She states she is also having fatigue and a fleeting dizziness. She has an appointment 10/13/2019 with Estella Husk, and would like to speak with a nurse about whether she should be seen sooner. Please advise.

## 2019-09-23 NOTE — Patient Instructions (Signed)
Medication Instructions:  Your physician recommends that you continue on your current medications as directed. Please refer to the Current Medication list given to you today.  *If you need a refill on your cardiac medications before your next appointment, please call your pharmacy*   Lab Work: None ordered  If you have labs (blood work) drawn today and your tests are completely normal, you will receive your results only by: Marland Kitchen MyChart Message (if you have MyChart) OR . A paper copy in the mail If you have any lab test that is abnormal or we need to change your treatment, we will call you to review the results.   Testing/Procedures: Your physician has requested that you have a lexiscan myoview. For further information please visit [email or web address]. Please follow instruction sheet, as given.     Follow-Up: At [facility], you and your health needs are our priority.  As part of our continuing mission to provide you with exceptional heart care, we have created designated Provider Care Teams.  These Care Teams include your primary Cardiologist (physician) and Advanced Practice Providers (APPs -  Physician Assistants and Nurse Practitioners) who all work together to provide you with the care you need, when you need it.  We recommend signing up for the patient portal called "MyChart".  Sign up information is provided on this After Visit Summary.  MyChart is used to connect with patients for Virtual Visits (Telemedicine).  Patients are able to view lab/test results, encounter notes, upcoming appointments, etc.  Non-urgent messages can be sent to your provider as well.   To learn more about what you can do with MyChart, go to [email or web address].    Your next appointment:   3 month(s)  The format for your next appointment:   In Person  Provider:   You may see Lauree Chandler, MD or one of the following Advanced Practice Providers on your designated Care Team:    Melina Copa,  PA-C  Ermalinda Barrios, PA-C    Other Instructions  Cardiac Nuclear Scan A cardiac nuclear scan is a test that is done to check the flow of blood to your heart. It is done when you are resting and when you are exercising. The test looks for problems such as:  Not enough blood reaching a portion of the heart.  The heart muscle not working as it should. You may need this test if:  You have heart disease.  You have had lab results that are not normal.  You have had heart surgery or a balloon procedure to open up blocked arteries (angioplasty).  You have chest pain.  You have shortness of breath. In this test, a special dye (tracer) is put into your bloodstream. The tracer will travel to your heart. A camera will then take pictures of your heart to see how the tracer moves through your heart. This test is usually done at a hospital and takes 2-4 hours. Tell a doctor about:  Any allergies you have.  All medicines you are taking, including vitamins, herbs, eye drops, creams, and over-the-counter medicines.  Any problems you or family members have had with anesthetic medicines.  Any blood disorders you have.  Any surgeries you have had.  Any medical conditions you have.  Whether you are pregnant or may be pregnant. What are the risks? Generally, this is a safe test. However, problems may occur, such as:  Serious chest pain and heart attack. This is only a risk if the stress portion of the test is  done.  Rapid heartbeat.  A feeling of warmth in your chest. This feeling usually does not last long.  Allergic reaction to the tracer. What happens before the test?  Ask your doctor about changing or stopping your normal medicines. This is important.  Follow instructions from your doctor about what you cannot eat or drink.  Remove your jewelry on the day of the test. What happens during the test?  An IV tube will be inserted into one of your veins.  Your doctor will give  you a small amount of tracer through the IV tube.  You will wait for 20-40 minutes while the tracer moves through your bloodstream.  Your heart will be monitored with an electrocardiogram (ECG).  You will lie down on an exam table.  Pictures of your heart will be taken for about 15-20 minutes.  You may also have a stress test. For this test, one of these things may be done: ? You will be asked to exercise on a treadmill or a stationary bike. ? You will be given medicines that will make your heart work harder. This is done if you are unable to exercise.  When blood flow to your heart has peaked, a tracer will again be given through the IV tube.  After 20-40 minutes, you will get back on the exam table. More pictures will be taken of your heart.  Depending on the tracer that is used, more pictures may need to be taken 3-4 hours later.  Your IV tube will be removed when the test is over. The test may vary among doctors and hospitals. What happens after the test?  Ask your doctor: ? Whether you can return to your normal schedule, including diet, activities, and medicines. ? Whether you should drink more fluids. This will help to remove the tracer from your body. Drink enough fluid to keep your pee (urine) pale yellow.  Ask your doctor, or the department that is doing the test: ? When will my results be ready? ? How will I get my results? Summary  A cardiac nuclear scan is a test that is done to check the flow of blood to your heart.  Tell your doctor whether you are pregnant or may be pregnant.  Before the test, ask your doctor about changing or stopping your normal medicines. This is important.  Ask your doctor whether you can return to your normal activities. You may be asked to drink more fluids. This information is not intended to replace advice given to you by your health care provider. Make sure you discuss any questions you have with your health care provider. Document  Revised: 09/23/2018 Document Reviewed: 11/17/2017 Elsevier Patient Education  [place].

## 2019-09-23 NOTE — Progress Notes (Signed)
Cardiology Office Note    Date:  09/23/2019   ID:  [name], DOB Aug 03, 1940, MRN [id number]  PCP:  Shon Baton, MD  Cardiologist:  Dr. Angelena Form  Chief Complaint: L arm and shoulder pain   History of Present Illness:   [name] is a 79 y.o. female with hx of CAD s/p multiple stents, HTN, HLD, OSA, carotid artery disease added to my scheduled for L arm and shoulder pain.   Hx of CAD s/p DES to LAD and Diagonal in May 2008, followed by drug eluting stent placement in the RCA in December 2008. Cardiac cath June 2013 with stable disease, patent stents LAD, Diagonal, and RCA. Admitted July 2014 with left shoulder pain. Stress myoview 12/28/12 with no ischemia. She had some left sided weakness in March 2015 and was seen in [facility] and was found to have moderate carotid artery disease.   Last seen by Dr. Angelena Form 04/2019.  Added to my schedule for evaluation of left shoulder and arm pain.  Patient had 3 episodes of left shoulder pain radiating to her arm since beginning of March.  First time she was trimming limbs, last episode this morning after digging holes for planting.  No associated chest pressure, shortness of breath, diaphoresis, nausea or vomiting.  She uses push mowing lawn without any problem however sometimes feels fatigue while walking uphill.  She dig hole  yesterday without any problem.  Denies any any trauma or prior arthritic pain on left shoulder.  History of right shoulder surgery many years ago.  She cannot recall her prior anginal symptoms.  She had tried sublingual nitroglycerin x1 this morning and eventually resolution of pain within 30 minutes.  Past Medical History:  Diagnosis Date  . CAD (coronary artery disease)    a. DES to LAD, diagonal 10/2006. b. DES to RCA 05/2007. c. Last cath 11/2012: patent stents, stable dz. d. Carlton Adam cardiolite negative 12/2012.  . Cataract    left eye  . Diverticulitis   . Diverticulosis of colon   . Hemorrhoids   . History of  recurrent UTIs   . Hx of adenomatous colonic polyps    tubular  . Hyperlipidemia   . Hypertension   . Peripheral vascular disease ([place])   . Sleep apnea    wears CPAP    Past Surgical History:  Procedure Laterality Date  . ANGIOPLASTY  5/08  and  12/08    stent x3  . Cataract left    . EYE SURGERY    . FOOT TENDON SURGERY Left Sept. 20, 2016    Left Calf / foot -   Dr. Sharol Given  . MASS EXCISION Right 07/13/2019   Procedure: EXCISION VOLAR CYST RIGHT WRIST;  Surgeon: Daryll Brod, MD;  Location: [facility];  Service: Orthopedics;  Laterality: Right;  IV REGIONAL FOREARM BLOCK  . Right shoulder surgery    . Vitrectomy left eye      Current Medications: Prior to Admission medications   Medication Sig Start Date End Date Taking? Authorizing Provider  amLODipine (NORVASC) 2.5 MG tablet TAKE 1 TABLET BY MOUTH EVERY DAY 06/02/19   Burnell Blanks, MD  aspirin 81 MG tablet Take 1 tablet (81 mg total) by mouth daily. 12/28/12   Dunn, Nedra Hai, PA-C  cholecalciferol (VITAMIN D) 1000 UNITS tablet Take 1,000 Units by mouth daily.    [provider]  clopidogrel (PLAVIX) 75 MG tablet TAKE 1 TABLET BY MOUTH EVERY DAY 06/02/19   McAlhany,  Annita Brod, MD  fish oil-omega-3 fatty acids 1000 MG capsule Take 1 g by mouth 2 (two) times daily.    [provider]  fluticasone (FLONASE) 50 MCG/ACT nasal spray Place 1 spray into both nostrils as needed for allergies.  06/02/14   [provider]  Multiple Vitamin (MULTIVITAMIN WITH MINERALS) TABS Take 1 tablet by mouth daily.    [provider]  nitroGLYCERIN (NITROSTAT) 0.4 MG SL tablet Place 1 tablet (0.4 mg total) under the tongue every 5 (five) minutes as needed for chest pain. 04/23/19   Burnell Blanks, MD  Psyllium (METAMUCIL) 30.9 % POWD Take by mouth 1 dose over 24 hours.      [provider]  rosuvastatin (CRESTOR) 40 MG tablet TAKE 1 TABLET BY MOUTH EVERY OTHER DAY 06/05/19    Burnell Blanks, MD  traMADol (ULTRAM) 50 MG tablet Take 1 tablet (50 mg total) by mouth every 6 (six) hours as needed. 07/13/19   Daryll Brod, MD  vitamin B-12 (CYANOCOBALAMIN) 1000 MCG tablet Take 1,000 mcg by mouth daily.    [provider]  vitamin C (ASCORBIC ACID) 500 MG tablet Take 500 mg by mouth daily.    [provider]  zinc gluconate 50 MG tablet Take 50 mg by mouth every other day.    [provider]    Allergies:   Fluoride preparations   Social History   Socioeconomic History  . Marital status: Married    Spouse name: Not on file  . Number of children: 0  . Years of education: Not on file  . Highest education level: Not on file  Occupational History  . Occupation: retired     Fish farm manager: RETIRED  Tobacco Use  . Smoking status: Never Smoker  . Smokeless tobacco: Never Used  Substance and Sexual Activity  . Alcohol use: Yes    Alcohol/week: 2.0 - 3.0 standard drinks    Types: 2 - 3 Standard drinks or equivalent per week  . Drug use: No  . Sexual activity: Not on file  Other Topics Concern  . Not on file  Social History Narrative  . Not on file   Social Determinants of Health   Financial Resource Strain:   . Difficulty of Paying Living Expenses:   Food Insecurity:   . Worried About Charity fundraiser in the Last Year:   . Arboriculturist in the Last Year:   Transportation Needs:   . Film/video editor (Medical):   Marland Kitchen Lack of Transportation (Non-Medical):   Physical Activity:   . Days of Exercise per Week:   . Minutes of Exercise per Session:   Stress:   . Feeling of Stress :   Social Connections:   . Frequency of Communication with Friends and Family:   . Frequency of Social Gatherings with Friends and Family:   . Attends Religious Services:   . Active Member of Clubs or Organizations:   . Attends Archivist Meetings:   Marland Kitchen Marital Status:      Family History:  The patient's family history includes  Cancer in her brother; Colon polyps in her brother and paternal grandfather; Deep vein thrombosis in her father; Heart attack in her brother; Heart attack (age of onset: 11) in her father; Heart disease in her brother, brother, father, and another family member; Hyperlipidemia in her brother and father; Stroke in her mother.   ROS:   Please see the history of present illness.  ROS All other systems reviewed and are negative.   PHYSICAL EXAM:   VS:  BP 130/70   Pulse 84   Ht 5\' 3"  (1.6 m)   Wt 161 lb 9.6 oz (73.3 kg)   SpO2 95%   BMI 28.63 kg/m    GEN: Well nourished, well developed, in no acute distress  HEENT: normal  Neck: no JVD, carotid bruits, or masses Cardiac:RRR; no murmurs, rubs, or gallops,no edema  Respiratory:  clear to auscultation bilaterally, normal work of breathing GI: soft, nontender, nondistended, + BS MS: no deformity or atrophy  Skin: warm and dry, no rash Neuro:  Alert and Oriented x 3, Strength and sensation are intact Psych: euthymic mood, full affect  Wt Readings from Last 3 Encounters:  09/23/19 161 lb 9.6 oz (73.3 kg)  08/25/19 160 lb (72.6 kg)  07/13/19 160 lb 11.5 oz (72.9 kg)      Studies/Labs Reviewed:   EKG:  EKG is ordered today.  The ekg ordered today demonstrates normal sinus rhythm at rate of 79 bpm  Recent Labs: No results found for requested labs within last 8760 hours.   Lipid Panel    Component Value Date/Time   CHOL 159 12/16/2012 0902   TRIG 132.0 12/16/2012 0902   HDL 56.00 12/16/2012 0902   CHOLHDL 3 12/16/2012 0902   VLDL 26.4 12/16/2012 0902   LDLCALC 77 12/16/2012 0902    Additional studies/ records that were reviewed today include:   Carotid doppler 08/2019 Summary:  Right Carotid: Velocities in the right ICA are consistent with a 60-79%  stenosis. Non-hemodynamically significant plaque <50% noted  in the CCA. The ECA appears <50% stenosed.   Left Carotid: Velocities in the left ICA are consistent with a 1-39%   stenosis.  Non-hemodynamically significant plaque <50% noted in the  CCA. The ECA appears <50% stenosed.   Echocardiogram: 04/2019 IMPRESSIONS    1. Left ventricular ejection fraction, by visual estimation, is 60 to  65%. The left ventricle has normal function. There is no left ventricular  hypertrophy.  2. Left ventricular diastolic parameters are indeterminate.  3. The average left ventricular global longitudinal strain is -22.3 %.  4. Global right ventricle has normal systolic function.The right  ventricular size is normal. No increase in right ventricular wall  thickness.  5. Left atrial size was normal.  6. Right atrial size was normal.  7. The mitral valve is normal in structure. Trace mitral valve  regurgitation.  8. The tricuspid valve is normal in structure. Tricuspid valve  regurgitation is trivial.  9. The aortic valve is normal in structure. Aortic valve regurgitation is  trivial. No evidence of aortic valve sclerosis or stenosis.  10. The pulmonic valve was not well visualized. Pulmonic valve  regurgitation is not visualized.  11. The inferior vena cava is normal in size with <50% respiratory  variability, suggesting right atrial pressure of 8 mmHg.  12. TR signal is inadequate for assessing pulmonary artery systolic  pressure.   Cardiac Catheterization:     ASSESSMENT & PLAN:   1.  Left arm/shoulder pain -Intermittent exertional left shoulder pain radiating to her arm without chest discomfort, shortness of breath, diaphoresis or nausea.  This could be musculoskeletal versus anginal.  She cannot recall her prior anginal pain when she had her PCI.  Her symptoms improved after sublingual nitroglycerin x1 this morning.  Discussed invasive versus noninvasive evaluation.  Patient decided to proceed with Lexiscan Myoview to rule out any ischemia.  Does not  want to try any medication yet.  As needed sublingual nitroglycerin.  2. CAD s/p stenting to LAD,  diagonal and RCA -Compliant with aspirin, Plavix and Crestor.  3. HTN -Blood pressure stable on current medication  4. HLD -Continue statin. - No results found for requested labs within last 8760 hours.  5. Carotid artery disease - Follow up vascular  - Last doppler 08/2019 showed right ICA stenosis of 60-79% and < 40% left ICA stenosis    Medication Adjustments/Labs and Tests Ordered: Current medicines are reviewed at length with the patient today.  Concerns regarding medicines are outlined above.  Medication changes, Labs and Tests ordered today are listed in the Patient Instructions below. Patient Instructions  Medication Instructions:  Your physician recommends that you continue on your current medications as directed. Please refer to the Current Medication list given to you today.  *If you need a refill on your cardiac medications before your next appointment, please call your pharmacy*   Lab Work: None ordered  If you have labs (blood work) drawn today and your tests are completely normal, you will receive your results only by: Marland Kitchen MyChart Message (if you have MyChart) OR . A paper copy in the mail If you have any lab test that is abnormal or we need to change your treatment, we will call you to review the results.   Testing/Procedures: Your physician has requested that you have a lexiscan myoview. For further information please visit [email or web address]. Please follow instruction sheet, as given.     Follow-Up: At [facility], you and your health needs are our priority.  As part of our continuing mission to provide you with exceptional heart care, we have created designated Provider Care Teams.  These Care Teams include your primary Cardiologist (physician) and Advanced Practice Providers (APPs -  Physician Assistants and Nurse Practitioners) who all work together to provide you with the care you need, when you need it.  We recommend signing up for the patient portal  called "MyChart".  Sign up information is provided on this After Visit Summary.  MyChart is used to connect with patients for Virtual Visits (Telemedicine).  Patients are able to view lab/test results, encounter notes, upcoming appointments, etc.  Non-urgent messages can be sent to your provider as well.   To learn more about what you can do with MyChart, go to [email or web address].    Your next appointment:   3 month(s)  The format for your next appointment:   In Person  Provider:   You may see Lauree Chandler, MD or one of the following Advanced Practice Providers on your designated Care Team:    Melina Copa, PA-C  Ermalinda Barrios, PA-C    Other Instructions  Cardiac Nuclear Scan A cardiac nuclear scan is a test that is done to check the flow of blood to your heart. It is done when you are resting and when you are exercising. The test looks for problems such as:  Not enough blood reaching a portion of the heart.  The heart muscle not working as it should. You may need this test if:  You have heart disease.  You have had lab results that are not normal.  You have had heart surgery or a balloon procedure to open up blocked arteries (angioplasty).  You have chest pain.  You have shortness of breath. In this test, a special dye (tracer) is put into your bloodstream. The tracer will travel to your heart. A camera will then take pictures of  your heart to see how the tracer moves through your heart. This test is usually done at a hospital and takes 2-4 hours. Tell a doctor about:  Any allergies you have.  All medicines you are taking, including vitamins, herbs, eye drops, creams, and over-the-counter medicines.  Any problems you or family members have had with anesthetic medicines.  Any blood disorders you have.  Any surgeries you have had.  Any medical conditions you have.  Whether you are pregnant or may be pregnant. What are the risks? Generally, this is a  safe test. However, problems may occur, such as:  Serious chest pain and heart attack. This is only a risk if the stress portion of the test is done.  Rapid heartbeat.  A feeling of warmth in your chest. This feeling usually does not last long.  Allergic reaction to the tracer. What happens before the test?  Ask your doctor about changing or stopping your normal medicines. This is important.  Follow instructions from your doctor about what you cannot eat or drink.  Remove your jewelry on the day of the test. What happens during the test?  An IV tube will be inserted into one of your veins.  Your doctor will give you a small amount of tracer through the IV tube.  You will wait for 20-40 minutes while the tracer moves through your bloodstream.  Your heart will be monitored with an electrocardiogram (ECG).  You will lie down on an exam table.  Pictures of your heart will be taken for about 15-20 minutes.  You may also have a stress test. For this test, one of these things may be done: ? You will be asked to exercise on a treadmill or a stationary bike. ? You will be given medicines that will make your heart work harder. This is done if you are unable to exercise.  When blood flow to your heart has peaked, a tracer will again be given through the IV tube.  After 20-40 minutes, you will get back on the exam table. More pictures will be taken of your heart.  Depending on the tracer that is used, more pictures may need to be taken 3-4 hours later.  Your IV tube will be removed when the test is over. The test may vary among doctors and hospitals. What happens after the test?  Ask your doctor: ? Whether you can return to your normal schedule, including diet, activities, and medicines. ? Whether you should drink more fluids. This will help to remove the tracer from your body. Drink enough fluid to keep your pee (urine) pale yellow.  Ask your doctor, or the department that is  doing the test: ? When will my results be ready? ? How will I get my results? Summary  A cardiac nuclear scan is a test that is done to check the flow of blood to your heart.  Tell your doctor whether you are pregnant or may be pregnant.  Before the test, ask your doctor about changing or stopping your normal medicines. This is important.  Ask your doctor whether you can return to your normal activities. You may be asked to drink more fluids. This information is not intended to replace advice given to you by your health care provider. Make sure you discuss any questions you have with your health care provider. Document Revised: 09/23/2018 Document Reviewed: 11/17/2017 Elsevier Patient Education  2020 [place], [place], [place]  09/23/2019 3:06 PM  [facility] [place], [place], [place]  [address] Phone: [phone]; Fax: [phone]

## 2019-10-05 ENCOUNTER — Telehealth (HOSPITAL_COMMUNITY): Payer: Self-pay | Admitting: *Deleted

## 2019-10-05 NOTE — Telephone Encounter (Signed)
Patient given detailed instructions per Myocardial Perfusion Study Information Sheet for the test on 10/08/19 at 10:30. Patient notified to arrive 15 minutes early and that it is imperative to arrive on time for appointment to keep from having the test rescheduled.  If you need to cancel or reschedule your appointment, please call the office within 24 hours of your appointment. . Patient verbalized understanding.[name]

## 2019-10-08 ENCOUNTER — Ambulatory Visit (HOSPITAL_COMMUNITY): Payer: Medicare PPO | Attending: Physician Assistant

## 2019-10-08 ENCOUNTER — Other Ambulatory Visit: Payer: Self-pay

## 2019-10-08 DIAGNOSIS — R0789 Other chest pain: Secondary | ICD-10-CM | POA: Diagnosis not present

## 2019-10-08 DIAGNOSIS — R079 Chest pain, unspecified: Secondary | ICD-10-CM

## 2019-10-08 LAB — MYOCARDIAL PERFUSION IMAGING
LV dias vol: 47 mL (ref 46–106)
LV sys vol: 15 mL
Peak HR: 95 {beats}/min
Rest HR: 63 {beats}/min
SDS: 3
SRS: 0
SSS: 3
TID: 1.02

## 2019-10-08 MED ORDER — TECHNETIUM TC 99M TETROFOSMIN IV KIT
10.2000 | PACK | Freq: Once | INTRAVENOUS | Status: AC | PRN
  Administered 2019-10-08: 10.2 via INTRAVENOUS
  Filled 2019-10-08: qty 11

## 2019-10-08 MED ORDER — REGADENOSON 0.4 MG/5ML IV SOLN
0.4000 mg | Freq: Once | INTRAVENOUS | Status: AC
  Administered 2019-10-08: 0.4 mg via INTRAVENOUS

## 2019-10-08 MED ORDER — TECHNETIUM TC 99M TETROFOSMIN IV KIT
30.7000 | PACK | Freq: Once | INTRAVENOUS | Status: AC | PRN
  Administered 2019-10-08: 30.7 via INTRAVENOUS
  Filled 2019-10-08: qty 31

## 2019-10-11 ENCOUNTER — Telehealth: Payer: Self-pay | Admitting: Cardiovascular Disease

## 2019-10-11 NOTE — Telephone Encounter (Signed)
Returned call to pt.  Left another message for pt to call back. 

## 2019-10-11 NOTE — Telephone Encounter (Signed)
New Message    Pt is returnring call for results     Please call back

## 2019-10-11 NOTE — Telephone Encounter (Signed)
-----   Message from [place], [place] sent at 10/08/2019  6:07 PM EDT ----- Low risk stress test. This would not explains her symptoms.

## 2019-10-11 NOTE — Telephone Encounter (Signed)
Pt returned call.  She has been made aware of her stress test results an verbalized understanding.

## 2019-10-13 ENCOUNTER — Ambulatory Visit: Payer: Medicare PPO | Admitting: Physician Assistant

## 2019-10-13 DIAGNOSIS — M67431 Ganglion, right wrist: Secondary | ICD-10-CM | POA: Insufficient documentation

## 2019-11-29 DIAGNOSIS — N3281 Overactive bladder: Secondary | ICD-10-CM | POA: Insufficient documentation

## 2019-11-29 DIAGNOSIS — N1831 Chronic kidney disease, stage 3a: Secondary | ICD-10-CM | POA: Insufficient documentation

## 2019-11-30 ENCOUNTER — Encounter: Payer: Self-pay | Admitting: Physician Assistant

## 2019-12-16 ENCOUNTER — Other Ambulatory Visit (HOSPITAL_BASED_OUTPATIENT_CLINIC_OR_DEPARTMENT_OTHER): Payer: Self-pay | Admitting: Internal Medicine

## 2019-12-16 ENCOUNTER — Ambulatory Visit (HOSPITAL_BASED_OUTPATIENT_CLINIC_OR_DEPARTMENT_OTHER)
Admission: RE | Admit: 2019-12-16 | Discharge: 2019-12-16 | Disposition: A | Discharge status: Home / Self Care | Payer: Medicare PPO | Source: Ambulatory Visit | Attending: Internal Medicine | Admitting: Internal Medicine

## 2019-12-16 ENCOUNTER — Other Ambulatory Visit: Payer: Self-pay

## 2019-12-16 DIAGNOSIS — R110 Nausea: Secondary | ICD-10-CM | POA: Diagnosis present

## 2019-12-16 DIAGNOSIS — R197 Diarrhea, unspecified: Secondary | ICD-10-CM | POA: Diagnosis present

## 2019-12-16 DIAGNOSIS — D72829 Elevated white blood cell count, unspecified: Secondary | ICD-10-CM

## 2019-12-16 DIAGNOSIS — R1084 Generalized abdominal pain: Secondary | ICD-10-CM | POA: Insufficient documentation

## 2019-12-16 MED ORDER — IOHEXOL 300 MG/ML  SOLN
100.0000 mL | Freq: Once | INTRAMUSCULAR | Status: AC | PRN
  Administered 2019-12-16: 100 mL via INTRAVENOUS

## 2020-01-04 ENCOUNTER — Ambulatory Visit: Payer: Medicare PPO | Admitting: Physician Assistant

## 2020-01-04 ENCOUNTER — Telehealth: Payer: Self-pay | Admitting: *Deleted

## 2020-01-04 ENCOUNTER — Encounter: Payer: Self-pay | Admitting: Physician Assistant

## 2020-01-04 VITALS — BP 124/64 | HR 80 | Ht 62.0 in | Wt 159.0 lb

## 2020-01-04 DIAGNOSIS — Z7901 Long term (current) use of anticoagulants: Secondary | ICD-10-CM | POA: Diagnosis not present

## 2020-01-04 DIAGNOSIS — K5732 Diverticulitis of large intestine without perforation or abscess without bleeding: Secondary | ICD-10-CM

## 2020-01-04 DIAGNOSIS — K5900 Constipation, unspecified: Secondary | ICD-10-CM

## 2020-01-04 DIAGNOSIS — Z86010 Personal history of colonic polyps: Secondary | ICD-10-CM | POA: Diagnosis not present

## 2020-01-04 MED ORDER — SUTAB 1479-225-188 MG PO TABS: 1.0000 | ORAL_TABLET | Freq: Once | ORAL | 0 refills | Status: AC

## 2020-01-04 NOTE — Telephone Encounter (Signed)
[facility] Pre-operative Risk Assessment     Request for surgical clearance:     Endoscopy Procedure  What type of surgery is being performed?     Colonoscopy  When is this surgery scheduled?     Tuesday 03/07/20  What type of clearance is required ?   Pharmacy  Are there any medications that need to be held prior to surgery and how long? Plavix 5 days  Practice name and name of physician performing surgery?      [facility]  What is your office phone and fax number?      Phone- [phone]  [phone]  Anesthesia type (None, local, MAC, general) ?       MAC

## 2020-01-04 NOTE — Progress Notes (Signed)
Reviewed and agree with management plan.  Shelma Eiben T. Carrington Mullenax, MD FACG [facility]  

## 2020-01-04 NOTE — Progress Notes (Addendum)
Chief Complaint: History of polyps, recent episode of diverticulitis, constipation  HPI:    [name] is a 79 year old female, previously known to Dr. Olevia Perches, with a past medical history as listed below including CAD and PVD on Plavix, who presents to clinic today to discuss her history of polyps and need for repeat colonoscopy and also recent episode of diverticulitis and constipation.      11/23/2014 colonoscopy with 2 sessile polyps in the cecum, mild diverticulosis and small internal grade 1 hemorrhoids.  Pathology showed tubular adenomas and repeat was recommended in 5 years.     12/16/2019 CT abdomen pelvis with contrast showed sigmoid diverticulosis with inflammatory stranding around the mid sigmoid colon compatible with active diverticulitis, cholelithiasis and aortic atherosclerosis.    12/16/2019 CMP normal.  CBC with a white count high at 19.36.    12/28/2019 CBC with a white count of 12.79, lymphocytes 6.4.    Today, the patient presents to clinic and tells me she just now finished her antibiotics for an episode of diverticulitis.  This is her second episode ever.  She continues with some lingering discomfort but she would not call it pain.  Initially, this radiated across her lower abdomen and now residually is mostly in the right side of her colon.  Today patient mostly complains that she has battled with constipation her whole life and been on fiber supplement for many years now daily.  This helps her to have daily stools but they are still little balls and she tells me she can feel them move throughout her colon and almost feels like "it is raw in there".  Has not tried any laxatives or stool softeners.  Does drink at least eight 8 ounce glasses of water per day.    Denies fever, chills, nausea, vomiting, blood in her stool or symptoms that awaken her from sleep.  Past Medical History:  Diagnosis Date  . CAD (coronary artery disease)    a. DES to LAD, diagonal 10/2006. b. DES to RCA  05/2007. c. Last cath 11/2012: patent stents, stable dz. d. Carlton Adam cardiolite negative 12/2012.  . Cataract    left eye  . Diverticulitis   . Diverticulosis of colon   . Hemorrhoids   . History of recurrent UTIs   . Hx of adenomatous colonic polyps    tubular  . Hyperlipidemia   . Hypertension   . Peripheral vascular disease ([place])   . Sleep apnea    wears CPAP    Past Surgical History:  Procedure Laterality Date  . ANGIOPLASTY  5/08  and  12/08    stent x3  . Cataract left    . EYE SURGERY    . FOOT TENDON SURGERY Left Sept. 20, 2016    Left Calf / foot -   Dr. Sharol Given  . MASS EXCISION Right 07/13/2019   Procedure: EXCISION VOLAR CYST RIGHT WRIST;  Surgeon: Daryll Brod, MD;  Location: [facility];  Service: Orthopedics;  Laterality: Right;  IV REGIONAL FOREARM BLOCK  . Right shoulder surgery    . Vitrectomy left eye      Current Outpatient Medications  Medication Sig Dispense Refill  . amLODipine (NORVASC) 2.5 MG tablet TAKE 1 TABLET BY MOUTH EVERY DAY 90 tablet 3  . aspirin 81 MG tablet Take 1 tablet (81 mg total) by mouth daily.    . calcium carbonate (TUMS - DOSED IN MG ELEMENTAL CALCIUM) 500 MG chewable tablet Chew 1 tablet by mouth as needed for  indigestion or heartburn.    . cholecalciferol (VITAMIN D) 1000 UNITS tablet Take 1,000 Units by mouth daily.    . clopidogrel (PLAVIX) 75 MG tablet TAKE 1 TABLET BY MOUTH EVERY DAY 90 tablet 3  . fish oil-omega-3 fatty acids 1000 MG capsule Take 1 g by mouth 2 (two) times daily.    . fluticasone (FLONASE) 50 MCG/ACT nasal spray Place 1 spray into both nostrils as needed for allergies.   2  . magnesium oxide (MAG-OX) 400 MG tablet Take 400 mg by mouth daily.    . Multiple Vitamin (MULTIVITAMIN WITH MINERALS) TABS Take 1 tablet by mouth daily.    . nitroGLYCERIN (NITROSTAT) 0.4 MG SL tablet Place 1 tablet (0.4 mg total) under the tongue every 5 (five) minutes as needed for chest pain. 25 tablet 5  . Psyllium (METAMUCIL)  30.9 % POWD Take by mouth 1 dose over 24 hours.      . rosuvastatin (CRESTOR) 40 MG tablet TAKE 1 TABLET BY MOUTH EVERY OTHER DAY 45 tablet 3  . vitamin B-12 (CYANOCOBALAMIN) 1000 MCG tablet Take 1,000 mcg by mouth daily.    . vitamin C (ASCORBIC ACID) 500 MG tablet Take 500 mg by mouth daily.     No current facility-administered medications for this visit.    Allergies as of 01/04/2020 - Review Complete 01/04/2020  Allergen Reaction Noted  . Fluoride preparations Other (See Comments) 05/23/2011    Family History  Problem Relation Age of Onset  . Colon polyps Brother   . Heart disease Brother        before age 81  . Cancer Brother   . Hyperlipidemia Brother   . Heart attack Brother   . Heart disease Father        before age 62  . Deep vein thrombosis Father   . Hyperlipidemia Father   . Heart attack Father 52  . Heart disease Brother   . Stroke Mother        TIA's  . Colon polyps Paternal Grandfather   . Heart disease Other        Grandmother   . Colon cancer Neg Hx   . Esophageal cancer Neg Hx   . Stomach cancer Neg Hx   . Rectal cancer Neg Hx     Social History   Socioeconomic History  . Marital status: Married    Spouse name: Not on file  . Number of children: 0  . Years of education: Not on file  . Highest education level: Not on file  Occupational History  . Occupation: retired     Fish farm manager: RETIRED  Tobacco Use  . Smoking status: Never Smoker  . Smokeless tobacco: Never Used  Substance and Sexual Activity  . Alcohol use: Yes    Alcohol/week: 2.0 - 3.0 standard drinks    Types: 2 - 3 Standard drinks or equivalent per week  . Drug use: No  . Sexual activity: Not on file  Other Topics Concern  . Not on file  Social History Narrative  . Not on file   Social Determinants of Health   Financial Resource Strain:   . Difficulty of Paying Living Expenses:   Food Insecurity:   . Worried About Charity fundraiser in the Last Year:   . Arboriculturist in  the Last Year:   Transportation Needs:   . Film/video editor (Medical):   Marland Kitchen Lack of Transportation (Non-Medical):   Physical Activity:   . Days of  Exercise per Week:   . Minutes of Exercise per Session:   Stress:   . Feeling of Stress :   Social Connections:   . Frequency of Communication with Friends and Family:   . Frequency of Social Gatherings with Friends and Family:   . Attends Religious Services:   . Active Member of Clubs or Organizations:   . Attends Archivist Meetings:   Marland Kitchen Marital Status:   Intimate Partner Violence:   . Fear of Current or Ex-Partner:   . Emotionally Abused:   Marland Kitchen Physically Abused:   . Sexually Abused:     Review of Systems:    Constitutional: No weight loss, fever or chills Skin: No rash  Cardiovascular: No chest pain  Respiratory: No SOB Gastrointestinal: See HPI and otherwise negative Genitourinary: No dysuria  Neurological: No headache, dizziness or syncope Musculoskeletal: No new muscle or joint pain Hematologic: No bleeding  Psychiatric: No history of depression or anxiety   Physical Exam:  Vital signs: BP 124/64 (BP Location: Left Arm, Patient Position: Sitting, Cuff Size: Normal)   Pulse 80   Ht 5\' 2"  (1.575 m) Comment: height measured without shoes  Wt 159 lb (72.1 kg)   BMI 29.08 kg/m   Constitutional:   Pleasant Elderly Caucasian female appears to be in NAD, Well developed, Well nourished, alert and cooperative Head:  Normocephalic and atraumatic. Eyes:   PEERL, EOMI. No icterus. Conjunctiva pink. Ears:  Normal auditory acuity. Neck:  Supple Throat: Oral cavity and pharynx without inflammation, swelling or lesion.  Respiratory: Respirations even and unlabored. Lungs clear to auscultation bilaterally.   No wheezes, crackles, or rhonchi.  Cardiovascular: Normal S1, S2. No MRG. Regular rate and rhythm. No peripheral edema, cyanosis or pallor.  Gastrointestinal:  Soft, nondistended, Mild RLQ ttp. No rebound or  guarding. Normal bowel sounds. No appreciable masses or hepatomegaly. Rectal:  Not performed.  Msk:  Symmetrical without gross deformities. Without edema, no deformity or joint abnormality.  Neurologic:  Alert and  oriented x4;  grossly normal neurologically.  Skin:   Dry and intact without significant lesions or rashes. Psychiatric:  Demonstrates good judgement and reason without abnormal affect or behaviors.  See HPI for recent imaging.  Assessment: 1.  History of adenomatous polyps: Last colonoscopy in 2016 with recommendations to repeat in 5 years, patient is now 19 but would like to proceed 2.  Chronic anticoagulation: With Plavix for CAD 3.  Diverticulitis: Recent episode, just finished antibiotics within the past few days 4.  Constipation: Chronic for the patient, some better with fiber  Plan: 1.  Discussed recent episode of diverticulitis with the patient.  I would recommend that we wait at least another 6 weeks before performing surveillance colonoscopy. 2.  Patient was scheduled for surveillance colonoscopy due to history of adenomatous polyps in the [place] with Dr. Fuller Plan.  Did discuss risks, benefits, limitations and alternatives and patient agrees to proceed.  We did discuss that this may be her last colonoscopy due to age, pending findings. 3.  Advised to hold her Plavix for 5 days prior to time of procedure.  We will communicate with her prescribing physician to ensure this is acceptable for her. 4.  Told the patient to let us know if she has any further episodes of diverticulitis or has increased pain prior to time of her colonoscopy. 5.  Discussed constipation, would recommend continuing her fiber and water intake as well as adding in MiraLAX.  We discussed titration of this up to  4 times daily.  Would start with 1 dose a day. 6.  Patient to follow in clinic per recommendations from Dr. Fuller Plan after time of procedure.  Ellouise Newer, PA-C [facility] 01/04/2020,  8:51 AM  Cc: Shon Baton, MD

## 2020-01-04 NOTE — Patient Instructions (Addendum)
If you are age 80 or older, your body mass index should be between 23-30. Your Body mass index is 29.08 kg/m. If this is out of the aforementioned range listed, please consider follow up with your Primary Care Provider.  If you are age 86 or younger, your body mass index should be between 19-25. Your Body mass index is 29.08 kg/m. If this is out of the aformentioned range listed, please consider follow up with your Primary Care Provider.   Miralax 1 capful daily in 8 ounces of liquid, may increase up to 4 times daily.   You have been scheduled for a colonoscopy. Please follow written instructions given to you at your visit today.  Please pick up your prep supplies at the pharmacy within the next 1-3 days. If you use inhalers (even only as needed), please bring them with you on the day of your procedure.  Due to recent changes in healthcare laws, you may see the results of your imaging and laboratory studies on MyChart before your provider has had a chance to review them.  We understand that in some cases there may be results that are confusing or concerning to you. Not all laboratory results come back in the same time frame and the provider may be waiting for multiple results in order to interpret others.  Please give [place] 48 hours in order for your provider to thoroughly review all the results before contacting the office for clarification of your results.

## 2020-01-04 NOTE — Telephone Encounter (Signed)
   Primary Cardiologist: Lauree Chandler, MD  Chart reviewed as part of pre-operative protocol coverage. Given past medical history and time since last visit, based on ACC/AHA guidelines, ZEBA LUBY would be at acceptable risk for the planned procedure without further cardiovascular testing.   Based on previous recommendations from Dr Julianne Handler OK to hold Plavix and aspirin 5-7 days pre op, resume as soon as possible post op.   I will route this recommendation to the requesting party via Epic fax function and remove from pre-op pool.  Please call with questions.  Kerin Ransom, PA-C 01/04/2020, 10:51 AM

## 2020-01-06 ENCOUNTER — Other Ambulatory Visit: Payer: Self-pay | Admitting: Internal Medicine

## 2020-01-06 DIAGNOSIS — Z1231 Encounter for screening mammogram for malignant neoplasm of breast: Secondary | ICD-10-CM

## 2020-01-18 NOTE — Telephone Encounter (Signed)
Called and spoke to pt.  Relayed she needs to hold Plavix starting on Sept 16th. She expressed understanding.

## 2020-02-01 DIAGNOSIS — H0012 Chalazion right lower eyelid: Secondary | ICD-10-CM | POA: Diagnosis not present

## 2020-02-08 NOTE — Progress Notes (Signed)
Chief Complaint  Patient presents with  . Follow-up    CAD   History of Present Illness: 79 yo female with history of CAD with previous stenting of the mid LAD, Diagonal and Proximal and mid RCA, HTN, HLD, OSA, carotid artery disease here today for follow up. Her cardiac history includes drug eluting stents placed in the LAD and Diagonal in May 2008, followed by drug eluting stent placement in the RCA in December 2008. At her initial visit in our office November 2012, she had c/o left arm pain and her prior anginal equivalent was left arm pain. Stress test in November 2012 with no ischemia. I saw her June 2013 and she c/o recurrence of pain in her left arm with exertion associated with fatigue and SOB. Cardiac cath June 2013 with stable disease, patent stents LAD, Diagonal, RCA. Admitted July 2014 with left shoulder pain. Stress myoview 12/28/12 with no ischemia. She had some left sided weakness in March 2015 and was seen in [facility] and was found to have moderate carotid artery disease. She has been followed in the [facility] office for her carotid disease. Echo November 2020 with LVEF=60-65%. No significant valve disease.  Nuclear stress test April 2021 with no ischemia.   She is here today for follow up. The patient denies any chest pain, dyspnea, palpitations, lower extremity edema, orthopnea, PND, dizziness, near syncope or syncope.     Primary Care Physician: Shon Baton, MD  Past Medical History:  Diagnosis Date  . Asthma   . CAD (coronary artery disease)    a. DES to LAD, diagonal 10/2006. b. DES to RCA 05/2007. c. Last cath 11/2012: patent stents, stable dz. d. Carlton Adam cardiolite negative 12/2012.  . Cataract    left eye  . Diverticulitis   . Diverticulosis of colon   . Hemorrhoids   . History of recurrent UTIs   . Hx of adenomatous colonic polyps    tubular  . Hyperlipidemia   . Hypertension   . Peripheral vascular disease ([place])   . Sleep apnea    wears CPAP    Past Surgical History:   Procedure Laterality Date  . ANGIOPLASTY  5/08  and  12/08    stent x3  . Cataract left    . EYE SURGERY Left    laser eye surgery for blood leakage  . FOOT TENDON SURGERY Left Sept. 20, 2016    Left Calf / foot -   Dr. Sharol Given  . MASS EXCISION Right 07/13/2019   Procedure: EXCISION VOLAR CYST RIGHT WRIST;  Surgeon: Daryll Brod, MD;  Location: [place];  Service: Orthopedics;  Laterality: Right;  IV REGIONAL FOREARM BLOCK  . Right shoulder surgery    . Vitrectomy left eye      Current Outpatient Medications  Medication Sig Dispense Refill  . amLODipine (NORVASC) 2.5 MG tablet TAKE 1 TABLET BY MOUTH EVERY DAY 90 tablet 3  . aspirin 81 MG tablet Take 1 tablet (81 mg total) by mouth daily.    . cholecalciferol (VITAMIN D) 1000 UNITS tablet Take 1,000 Units by mouth daily.    . clopidogrel (PLAVIX) 75 MG tablet TAKE 1 TABLET BY MOUTH EVERY DAY 90 tablet 3  . fish oil-omega-3 fatty acids 1000 MG capsule Take 1 g by mouth 2 (two) times daily.    . fluticasone (FLONASE) 50 MCG/ACT nasal spray Place 1 spray into both nostrils as needed for allergies.   2  . levocetirizine (XYZAL) 5 MG tablet Take 1  tablet by mouth daily.    . Multiple Vitamin (MULTIVITAMIN WITH MINERALS) TABS Take 1 tablet by mouth daily.    . nitroGLYCERIN (NITROSTAT) 0.4 MG SL tablet Place 1 tablet (0.4 mg total) under the tongue every 5 (five) minutes as needed for chest pain. 25 tablet 5  . Psyllium (METAMUCIL) 30.9 % POWD Take by mouth 1 dose over 24 hours.      . rosuvastatin (CRESTOR) 40 MG tablet TAKE 1 TABLET BY MOUTH EVERY OTHER DAY 45 tablet 3  . vitamin B-12 (CYANOCOBALAMIN) 1000 MCG tablet Take 1,000 mcg by mouth daily.    . vitamin C (ASCORBIC ACID) 500 MG tablet Take 500 mg by mouth daily.     No current facility-administered medications for this visit.    Allergies  Allergen Reactions  . Other Hives, Itching and Swelling    VICRYL STITCHES Tongue swelling  . Fluoride Preparations Other  (See Comments)    A test for eye to make pictures, (  causing pressure of Left arm )    Social History   Socioeconomic History  . Marital status: Married    Spouse name: Not on file  . Number of children: 0  . Years of education: Not on file  . Highest education level: Not on file  Occupational History  . Occupation: retired     Fish farm manager: RETIRED  Tobacco Use  . Smoking status: Never Smoker  . Smokeless tobacco: Never Used  Vaping Use  . Vaping Use: Never used  Substance and Sexual Activity  . Alcohol use: Yes    Alcohol/week: 2.0 - 3.0 standard drinks    Types: 2 - 3 Standard drinks or equivalent per week    Comment: 1 per day  . Drug use: No  . Sexual activity: Not on file  Other Topics Concern  . Not on file  Social History Narrative  . Not on file   Social Determinants of Health   Financial Resource Strain:   . Difficulty of Paying Living Expenses: Not on file  Food Insecurity:   . Worried About Charity fundraiser in the Last Year: Not on file  . Ran Out of Food in the Last Year: Not on file  Transportation Needs:   . Lack of Transportation (Medical): Not on file  . Lack of Transportation (Non-Medical): Not on file  Physical Activity:   . Days of Exercise per Week: Not on file  . Minutes of Exercise per Session: Not on file  Stress:   . Feeling of Stress : Not on file  Social Connections:   . Frequency of Communication with Friends and Family: Not on file  . Frequency of Social Gatherings with Friends and Family: Not on file  . Attends Religious Services: Not on file  . Active Member of Clubs or Organizations: Not on file  . Attends Archivist Meetings: Not on file  . Marital Status: Not on file  Intimate Partner Violence:   . Fear of Current or Ex-Partner: Not on file  . Emotionally Abused: Not on file  . Physically Abused: Not on file  . Sexually Abused: Not on file    Family History  Problem Relation Age of Onset  . Colon polyps Brother    . Heart disease Brother        before age 78  . Hyperlipidemia Brother   . Prostate cancer Brother   . Heart disease Father        before age 35  .  Deep vein thrombosis Father   . Hyperlipidemia Father   . Heart attack Father 51  . Stroke Father   . Heart disease Brother   . Heart attack Brother   . Cancer Brother        Merkle cell  . Stroke Mother        TIA's  . Dementia Mother   . Ovarian cancer Maternal Grandmother   . Colon polyps Paternal Grandfather   . Liver cancer Paternal Grandfather   . Heart disease Other        Grandmother   . Colon cancer Neg Hx   . Esophageal cancer Neg Hx   . Stomach cancer Neg Hx   . Rectal cancer Neg Hx     Review of Systems:  As stated in the HPI and otherwise negative.   BP 118/78   Pulse 60   Ht 5\' 3"  (1.6 m)   Wt 150 lb (68 kg)   BMI 26.57 kg/m   Physical Examination:  General: Well developed, well nourished, NAD  HEENT: OP clear, mucus membranes moist  SKIN: warm, dry. No rashes. Neuro: No focal deficits  Musculoskeletal: Muscle strength 5/5 all ext  Psychiatric: Mood and affect normal  Neck: No JVD, no carotid bruits, no thyromegaly, no lymphadenopathy.  Lungs:Clear bilaterally, no wheezes, rhonci, crackles Cardiovascular: Regular rate and rhythm. No murmurs, gallops or rubs. Abdomen:Soft. Bowel sounds present. Non-tender.  Extremities: No lower extremity edema. Pulses are 2 + in the bilateral DP/PT.  Cardiac cath 11/27/11:  Left main:  Left Anterior Descending Artery: Large caliber vessel that courses to the apex. There is a patent stent in the proximal to mid LAD with minimal, 20% in stent restenosis. The diagonal branch arises just before the LAD stent. There is a patent stent in the ostium and proximal segment of the diagonal branch with perhaps 20% ostial stenosis.  Circumflex Artery: Moderate sized vessel with no disease noted. Small early OM branch with no disease.  Right Coronary Artery: Large, dominant vessel  with patent stent in the proximal vessel. Just before the stent, there is a 20% plaque. Just beyond the stent, there is a 30% plaque.  Left Ventricular Angiogram: LVEF 65-70%.   EKG:  EKG is not ordered today. The ekg ordered today demonstrates   Recent Labs: No results found for requested labs within last 8760 hours.   Lipid Panel Followed in primary care   Wt Readings from Last 3 Encounters:  02/09/20 150 lb (68 kg)  01/04/20 159 lb (72.1 kg)  10/08/19 161 lb (73 kg)     Other studies Reviewed: Additional studies/ records that were reviewed today include: . Review of the above records demonstrates:    Assessment and Plan:   1. CAD without angina: She has had stents placed in the LAD, Diagonal and RCA. Last cath in June 2013 with stable CAD.  Nuclear stress test April 2021 with no ischemia. No chest pain. Will continue ASA, Plavix and statin.  Refill NTG today.   2. HTN: BP is well controlled. Continue current therapy  3. HYPERLIPIDEMIA: Lipids followed in primary care. LDL 67 in June 2021. Continue statin  4. Carotid artery disease: Followed in [facility]   Current medicines are reviewed at length with the patient today.  The patient does not have concerns regarding medicines.  The following changes have been made:  no change  Labs/ tests ordered today include:   No orders of the defined types were placed in this encounter.  Disposition:   FU with me in 12  months  Signed, Lauree Chandler, MD 02/09/2020 9:14 AM    [facility] [address], [place], [place]  [address] Phone: [phone]; Fax: [phone]

## 2020-02-09 ENCOUNTER — Other Ambulatory Visit: Payer: Self-pay

## 2020-02-09 ENCOUNTER — Ambulatory Visit: Payer: Medicare PPO | Admitting: Cardiovascular Disease

## 2020-02-09 ENCOUNTER — Encounter: Payer: Self-pay | Admitting: Cardiovascular Disease

## 2020-02-09 VITALS — BP 118/78 | HR 60 | Ht 63.0 in | Wt 150.0 lb

## 2020-02-09 DIAGNOSIS — E7800 Pure hypercholesterolemia, unspecified: Secondary | ICD-10-CM

## 2020-02-09 DIAGNOSIS — I10 Essential (primary) hypertension: Secondary | ICD-10-CM

## 2020-02-09 DIAGNOSIS — I2510 Atherosclerotic heart disease of native coronary artery without angina pectoris: Secondary | ICD-10-CM | POA: Diagnosis not present

## 2020-02-09 DIAGNOSIS — I6523 Occlusion and stenosis of bilateral carotid arteries: Secondary | ICD-10-CM | POA: Diagnosis not present

## 2020-02-09 MED ORDER — NITROGLYCERIN 0.4 MG SL SUBL: 0.4000 mg | SUBLINGUAL_TABLET | SUBLINGUAL | 5 refills | Status: DC | PRN

## 2020-02-09 NOTE — Patient Instructions (Signed)

## 2020-02-10 ENCOUNTER — Ambulatory Visit
Admission: RE | Admit: 2020-02-10 | Discharge: 2020-02-10 | Disposition: A | Discharge status: Home / Self Care | Payer: Medicare PPO | Source: Ambulatory Visit | Attending: Internal Medicine | Admitting: Internal Medicine

## 2020-02-10 DIAGNOSIS — Z1231 Encounter for screening mammogram for malignant neoplasm of breast: Secondary | ICD-10-CM | POA: Diagnosis not present

## 2020-03-07 ENCOUNTER — Other Ambulatory Visit: Payer: Self-pay

## 2020-03-07 ENCOUNTER — Encounter: Payer: Self-pay | Admitting: Gastroenterology

## 2020-03-07 ENCOUNTER — Ambulatory Visit (AMBULATORY_SURGERY_CENTER): Payer: Medicare PPO | Admitting: Gastroenterology

## 2020-03-07 VITALS — BP 129/65 | HR 64 | Temp 95.7°F | Resp 14 | Ht 63.0 in | Wt 150.0 lb

## 2020-03-07 DIAGNOSIS — K5732 Diverticulitis of large intestine without perforation or abscess without bleeding: Secondary | ICD-10-CM

## 2020-03-07 DIAGNOSIS — D122 Benign neoplasm of ascending colon: Secondary | ICD-10-CM

## 2020-03-07 DIAGNOSIS — Z86010 Personal history of colonic polyps: Secondary | ICD-10-CM

## 2020-03-07 DIAGNOSIS — R933 Abnormal findings on diagnostic imaging of other parts of digestive tract: Secondary | ICD-10-CM

## 2020-03-07 DIAGNOSIS — K635 Polyp of colon: Secondary | ICD-10-CM

## 2020-03-07 MED ORDER — SODIUM CHLORIDE 0.9 % IV SOLN: 500.0000 mL | Freq: Once | INTRAVENOUS | Status: DC

## 2020-03-07 NOTE — Op Note (Signed)
[facility] Patient Name: [name] Procedure Date: 03/07/2020 10:27 AM MRN: [id number] Endoscopist: Ladene Artist , MD Age: 79 Referring MD:  Date of Birth: January 26, 1941 Gender: Female Account #: [id number] Procedure:                Colonoscopy Indications:              Surveillance: Personal history of adenomatous                            polyps on last colonoscopy 5 years ago. Recent                            diverticulitis. Abnormal CT of the colon. Medicines:                Monitored Anesthesia Care Procedure:                Pre-Anesthesia Assessment:                           - Prior to the procedure, a History and Physical                            was performed, and patient medications and                            allergies were reviewed. The patient's tolerance of                            previous anesthesia was also reviewed. The risks                            and benefits of the procedure and the sedation                            options and risks were discussed with the patient.                            All questions were answered, and informed consent                            was obtained. Prior Anticoagulants: The patient has                            taken Plavix (clopidogrel), last dose was 5 days                            prior to procedure. ASA Grade Assessment: III - A                            patient with severe systemic disease. After                            reviewing the risks and benefits, the patient was  deemed in satisfactory condition to undergo the                            procedure.                           After obtaining informed consent, the colonoscope                            was passed under direct vision. Throughout the                            procedure, the patient's blood pressure, pulse, and                            oxygen saturations were monitored continuously. The                             Colonoscope was introduced through the anus and                            advanced to the the cecum, identified by                            appendiceal orifice and ileocecal valve. The                            ileocecal valve, appendiceal orifice, and rectum                            were photographed. The quality of the bowel                            preparation was adequate. The colonoscopy was                            performed without difficulty. The patient tolerated                            the procedure well. Scope In: 10:33:15 AM Scope Out: 10:52:33 AM Scope Withdrawal Time: 0 hours 15 minutes 35 seconds  Total Procedure Duration: 0 hours 19 minutes 18 seconds  Findings:                 Skin tags were found on perianal exam.                           A 4 mm polyp was found in the proximal ascending                            colon. The polyp was sessile. The polyp was removed                            with a cold biopsy forceps. Resection and retrieval  were complete.                           Two sessile polyps were found in the proximal                            ascending colon. The polyps were 5 to 6 mm in size.                            These polyps were removed with a cold snare.                            Resection wasn complete and one was not retrieved.                           Multiple medium-mouthed diverticula were found in                            the left colon. There was narrowing of the colon in                            association with the diverticular opening. There                            was evidence of diverticular spasm. There was no                            evidence of diverticular bleeding.                           Anal papilla(e) were hypertrophied.                           Internal hemorrhoids were found during                            retroflexion. The hemorrhoids were small and  Grade                            I (internal hemorrhoids that do not prolapse).                           The exam was otherwise without abnormality on                            direct and retroflexion views. Complications:            No immediate complications. Estimated blood loss:                            None. Estimated Blood Loss:     Estimated blood loss: none. Impression:               - Perianal skin tags found on perianal exam.                           -  One 4 mm polyp in the proximal ascending colon,                            removed with a cold biopsy forceps. Resected and                            retrieved.                           - Two 5 to 6 mm polyps in the proximal ascending                            colon, removed with a cold snare. Resected and                            retrieved.                           - Moderate diverticulosis in the left colon.                           - Anal papilla(e) were hypertrophied.                           - Internal hemorrhoids.                           - The examination was otherwise normal on direct                            and retroflexion views. Recommendation:           - Resume Plavix (clopidogrel) tomorrow at prior                            dose. Refer to managing physician for further                            adjustment of therapy.                           - Patient has a contact number available for                            emergencies. The signs and symptoms of potential                            delayed complications were discussed with the                            patient. Return to normal activities tomorrow.                            Written discharge instructions were provided to the  patient.                           - High fiber diet.                           - Continue present medications.                           - Await pathology results.                            - No repeat colonoscopy due to age.                           - Follow up with PCP. GI follow up prn. Ladene Artist, MD 03/07/2020 10:58:48 AM This report has been signed electronically.

## 2020-03-07 NOTE — Progress Notes (Signed)
PT taken to PACU. Monitors in place. VSS. Report given to RN. 

## 2020-03-07 NOTE — Progress Notes (Signed)
[facility] 

## 2020-03-07 NOTE — Progress Notes (Signed)
Called to room to assist during endoscopic procedure.  Patient ID and intended procedure confirmed with present staff. Received instructions for my participation in the procedure from the performing physician.  

## 2020-03-07 NOTE — Patient Instructions (Signed)
HANDOUTS PROVIDED ON: HIGH FIBER DIET, POLYPS, DIVERTICULOSIS, & HEMORRHOIDS  The polyps removed today have been sent for pathology.  The results can take 1-3 weeks to receive.    You may resume your previous diet and medication schedule.  Thank you for allowing [place] to care for you today!!!   YOU HAD AN ENDOSCOPIC PROCEDURE TODAY AT [facility]:   Refer to the procedure report that was given to you for any specific questions about what was found during the examination.  If the procedure report does not answer your questions, please call your gastroenterologist to clarify.  If you requested that your care partner not be given the details of your procedure findings, then the procedure report has been included in a sealed envelope for you to review at your convenience later.  YOU SHOULD EXPECT: Some feelings of bloating in the abdomen. Passage of more gas than usual.  Walking can help get rid of the air that was put into your GI tract during the procedure and reduce the bloating. If you had a lower endoscopy (such as a colonoscopy or flexible sigmoidoscopy) you may notice spotting of blood in your stool or on the toilet paper. If you underwent a bowel prep for your procedure, you may not have a normal bowel movement for a few days.  Please Note:  You might notice some irritation and congestion in your nose or some drainage.  This is from the oxygen used during your procedure.  There is no need for concern and it should clear up in a day or so.  SYMPTOMS TO REPORT IMMEDIATELY:   Following lower endoscopy (colonoscopy or flexible sigmoidoscopy):  Excessive amounts of blood in the stool  Significant tenderness or worsening of abdominal pains  Swelling of the abdomen that is new, acute  Fever of 100F or higher  For urgent or emergent issues, a gastroenterologist can be reached at any hour by calling [phone]. Do not use MyChart messaging for urgent concerns.    DIET:  We  do recommend a small meal at first, but then you may proceed to your regular diet.  Drink plenty of fluids but you should avoid alcoholic beverages for 24 hours.  ACTIVITY:  You should plan to take it easy for the rest of today and you should NOT DRIVE or use heavy machinery until tomorrow (because of the sedation medicines used during the test).    FOLLOW UP: Our staff will call the number listed on your records 48-72 hours following your procedure to check on you and address any questions or concerns that you may have regarding the information given to you following your procedure. If we do not reach you, we will leave a message.  We will attempt to reach you two times.  During this call, we will ask if you have developed any symptoms of COVID 19. If you develop any symptoms (ie: fever, flu-like symptoms, shortness of breath, cough etc.) before then, please call [phone].  If you test positive for Covid 19 in the 2 weeks post procedure, please call and report this information to [place].    If any biopsies were taken you will be contacted by phone or by letter within the next 1-3 weeks.  Please call us at [phone] if you have not heard about the biopsies in 3 weeks.    SIGNATURES/CONFIDENTIALITY: You and/or your care partner have signed paperwork which will be entered into your electronic medical record.  These signatures attest  to the fact that that the information above on your After Visit Summary has been reviewed and is understood.  Full responsibility of the confidentiality of this discharge information lies with you and/or your care-partner. 

## 2020-03-08 DIAGNOSIS — M25552 Pain in left hip: Secondary | ICD-10-CM | POA: Diagnosis not present

## 2020-03-08 DIAGNOSIS — M25551 Pain in right hip: Secondary | ICD-10-CM | POA: Diagnosis not present

## 2020-03-08 DIAGNOSIS — M7061 Trochanteric bursitis, right hip: Secondary | ICD-10-CM | POA: Diagnosis not present

## 2020-03-09 ENCOUNTER — Telehealth: Payer: Self-pay

## 2020-03-09 NOTE — Telephone Encounter (Signed)
  Follow up Call-  Call back number 03/07/2020  Post procedure Call Back phone  # [phone]  Permission to leave phone message Yes  Some recent data might be hidden     Patient questions:  Do you have a fever, pain , or abdominal swelling? No. Pain Score  0 *  Have you tolerated food without any problems? Yes.    Have you been able to return to your normal activities? Yes.    Do you have any questions about your discharge instructions: Diet   No. Medications  No. Follow up visit  No.  Do you have questions or concerns about your Care? No.  Actions: * If pain score is 4 or above: No action needed, pain <4.  1. Have you developed a fever since your procedure? no  2.   Have you had an respiratory symptoms (SOB or cough) since your procedure? no  3.   Have you tested positive for COVID 19 since your procedure no  4.   Have you had any family members/close contacts diagnosed with the COVID 19 since your procedure?  no   If yes to any of these questions please route to Joylene John, RN and Joella Prince, RN

## 2020-03-09 NOTE — Telephone Encounter (Signed)
1st follow up call made.  NALM 

## 2020-03-15 DIAGNOSIS — M25552 Pain in left hip: Secondary | ICD-10-CM | POA: Diagnosis not present

## 2020-03-15 DIAGNOSIS — M25551 Pain in right hip: Secondary | ICD-10-CM | POA: Diagnosis not present

## 2020-03-15 DIAGNOSIS — M7061 Trochanteric bursitis, right hip: Secondary | ICD-10-CM | POA: Diagnosis not present

## 2020-03-16 ENCOUNTER — Encounter: Payer: Self-pay | Admitting: Gastroenterology

## 2020-03-16 ENCOUNTER — Other Ambulatory Visit: Payer: Self-pay

## 2020-03-16 DIAGNOSIS — I6523 Occlusion and stenosis of bilateral carotid arteries: Secondary | ICD-10-CM

## 2020-03-24 ENCOUNTER — Other Ambulatory Visit: Payer: Self-pay

## 2020-03-24 ENCOUNTER — Ambulatory Visit: Payer: Medicare PPO | Admitting: Physician Assistant

## 2020-03-24 ENCOUNTER — Ambulatory Visit (HOSPITAL_COMMUNITY)
Admission: RE | Admit: 2020-03-24 | Discharge: 2020-03-24 | Disposition: A | Discharge status: Home / Self Care | Payer: Medicare PPO | Source: Ambulatory Visit | Attending: Vascular Surgery | Admitting: Vascular Surgery

## 2020-03-24 VITALS — BP 145/77 | HR 69 | Temp 98.0°F | Resp 20 | Ht 63.0 in | Wt 160.2 lb

## 2020-03-24 DIAGNOSIS — I6523 Occlusion and stenosis of bilateral carotid arteries: Secondary | ICD-10-CM

## 2020-03-24 NOTE — Progress Notes (Signed)
Office Note     CC:  follow up Requesting Provider:  Shon Baton, MD  HPI: [name] is a 79 y.o. (1940/10/02) female who presents routine scheduled follow-up bilateral carotid artery stenosis.  She has had no current or previous symptoms of amaurosis fugax, extremity weakness, slurred speech or facial drooping.  Her family history significant for significant stroke in her father's history and TIAs in her mother's history.  She is status post coronary artery stent placement and is maintained on Plavix.  She discussed with her cardiologist using her statin every other day due to muscle aches.  She has no history of diabetes mellitus. No history of tobacco use.  She has nighttime bilateral thigh cramping but denies claudication.   Past Medical History:  Diagnosis Date  . Allergy   . Arthritis   . CAD (coronary artery disease)    a. DES to LAD, diagonal 10/2006. b. DES to RCA 05/2007. c. Last cath 11/2012: patent stents, stable dz. d. Carlton Adam cardiolite negative 12/2012.  . Cataract    left eye  . Diverticulitis   . Diverticulosis of colon   . GERD (gastroesophageal reflux disease)   . Hemorrhoids   . History of recurrent UTIs   . Hx of adenomatous colonic polyps    tubular  . Hyperlipidemia   . Hypertension   . Peripheral vascular disease ([facility])   . Sleep apnea    wears CPAP- 03/07/2020 no longer wears CPAP    Past Surgical History:  Procedure Laterality Date  . ANGIOPLASTY  5/08  and  12/08    stent x3  . Cataract left    . COLONOSCOPY    . EYE SURGERY Left    laser eye surgery for blood leakage  . FOOT TENDON SURGERY Left Sept. 20, 2016    Left Calf / foot -   Dr. Sharol Given  . MASS EXCISION Right 07/13/2019   Procedure: EXCISION VOLAR CYST RIGHT WRIST;  Surgeon: Daryll Brod, MD;  Location: [facility];  Service: Orthopedics;  Laterality: Right;  IV REGIONAL FOREARM BLOCK  . Right shoulder surgery    . Vitrectomy left eye      Social History    Socioeconomic History  . Marital status: Married    Spouse name: Not on file  . Number of children: 0  . Years of education: Not on file  . Highest education level: Not on file  Occupational History  . Occupation: retired     Fish farm manager: RETIRED  Tobacco Use  . Smoking status: Never Smoker  . Smokeless tobacco: Never Used  Vaping Use  . Vaping Use: Never used  Substance and Sexual Activity  . Alcohol use: Yes    Alcohol/week: 2.0 - 3.0 standard drinks    Types: 2 - 3 Standard drinks or equivalent per week    Comment: 1 per day  . Drug use: No  . Sexual activity: Not on file  Other Topics Concern  . Not on file  Social History Narrative  . Not on file   Social Determinants of Health   Financial Resource Strain:   . Difficulty of Paying Living Expenses: Not on file  Food Insecurity:   . Worried About Charity fundraiser in the Last Year: Not on file  . Ran Out of Food in the Last Year: Not on file  Transportation Needs:   . Lack of Transportation (Medical): Not on file  . Lack of Transportation (Non-Medical): Not on file  Physical  Activity:   . Days of Exercise per Week: Not on file  . Minutes of Exercise per Session: Not on file  Stress:   . Feeling of Stress : Not on file  Social Connections:   . Frequency of Communication with Friends and Family: Not on file  . Frequency of Social Gatherings with Friends and Family: Not on file  . Attends Religious Services: Not on file  . Active Member of Clubs or Organizations: Not on file  . Attends Archivist Meetings: Not on file  . Marital Status: Not on file  Intimate Partner Violence:   . Fear of Current or Ex-Partner: Not on file  . Emotionally Abused: Not on file  . Physically Abused: Not on file  . Sexually Abused: Not on file   Family History  Problem Relation Age of Onset  . Colon polyps Brother   . Heart disease Brother        before age 71  . Hyperlipidemia Brother   . Prostate cancer Brother   .  Heart disease Father        before age 50  . Deep vein thrombosis Father   . Hyperlipidemia Father   . Heart attack Father 66  . Stroke Father   . Heart disease Brother   . Heart attack Brother   . Cancer Brother        Merkle cell  . Stroke Mother        TIA's  . Dementia Mother   . Ovarian cancer Maternal Grandmother   . Colon polyps Paternal Grandfather   . Liver cancer Paternal Grandfather   . Heart disease Other        Grandmother   . Colon cancer Neg Hx   . Esophageal cancer Neg Hx   . Stomach cancer Neg Hx   . Rectal cancer Neg Hx     Current Outpatient Medications  Medication Sig Dispense Refill  . amLODipine (NORVASC) 2.5 MG tablet TAKE 1 TABLET BY MOUTH EVERY DAY 90 tablet 3  . aspirin 81 MG tablet Take 1 tablet (81 mg total) by mouth daily.    . cholecalciferol (VITAMIN D) 1000 UNITS tablet Take 1,000 Units by mouth daily.    . clopidogrel (PLAVIX) 75 MG tablet TAKE 1 TABLET BY MOUTH EVERY DAY 90 tablet 3  . fish oil-omega-3 fatty acids 1000 MG capsule Take 1 g by mouth 2 (two) times daily.    . fluticasone (FLONASE) 50 MCG/ACT nasal spray Place 1 spray into both nostrils as needed for allergies.   2  . levocetirizine (XYZAL) 5 MG tablet Take 1 tablet by mouth daily.    . Multiple Vitamin (MULTIVITAMIN WITH MINERALS) TABS Take 1 tablet by mouth daily.    . nitroGLYCERIN (NITROSTAT) 0.4 MG SL tablet Place 1 tablet (0.4 mg total) under the tongue every 5 (five) minutes as needed for chest pain. 25 tablet 5  . Psyllium (METAMUCIL) 30.9 % POWD Take by mouth 1 dose over 24 hours.      . rosuvastatin (CRESTOR) 40 MG tablet TAKE 1 TABLET BY MOUTH EVERY OTHER DAY 45 tablet 3  . vitamin B-12 (CYANOCOBALAMIN) 1000 MCG tablet Take 1,000 mcg by mouth daily.    . vitamin C (ASCORBIC ACID) 500 MG tablet Take 500 mg by mouth daily.     No current facility-administered medications for this visit.    Allergies  Allergen Reactions  . Other Hives, Itching and Swelling    VICRYL  STITCHES Tongue  swelling  . Fluoride Preparations Other (See Comments)    A test for eye to make pictures, (  causing pressure of Left arm )     REVIEW OF SYSTEMS:   [X]  denotes positive finding, [ ]  denotes negative finding Cardiac  Comments:  Chest pain or chest pressure:    Shortness of breath upon exertion:    Short of breath when lying flat:    Irregular heart rhythm:        Vascular    Pain in calf, thigh, or hip brought on by ambulation:    Pain in feet at night that wakes you up from your sleep:     Blood clot in your veins:    Leg swelling:         Pulmonary    Oxygen at home:    Productive cough:     Wheezing:         Neurologic    Sudden weakness in arms or legs:     Sudden numbness in arms or legs:     Sudden onset of difficulty speaking or slurred speech:    Temporary loss of vision in one eye:     Problems with dizziness:         Gastrointestinal    Blood in stool:     Vomited blood:         Genitourinary    Burning when urinating:     Blood in urine:        Psychiatric    Major depression:         Hematologic    Bleeding problems:    Problems with blood clotting too easily:        Skin    Rashes or ulcers:        Constitutional    Fever or chills:      PHYSICAL EXAMINATION:  Vitals:   03/24/20 1328 03/24/20 1330  BP: 138/74 (!) 145/77  Pulse: 69   Resp: 20   Temp: 98 F (36.7 C)   SpO2: 99%     General:  WDWN in NAD; vital signs documented above Gait: Unaided, no ataxia HENT: WNL, normocephalic Pulmonary: normal non-labored breathing , without Rales, rhonchi,  wheezing Cardiac: regular HR, without  Murmurs without carotid bruit Abdomen: soft, NT, no masses Skin: without rashes Vascular Exam/Pulses: 2+ brachial and radial pulses bilaterally.  1+ dorsalis pedis and posterior tibial pulses bilaterally. Extremities: without ischemic changes, without Gangrene , without cellulitis; without open wounds;  Musculoskeletal: no muscle  wasting or atrophy  Neurologic: A&O X 3;  No focal weakness or paresthesias are detected Psychiatric:  The pt has Normal affect.   Non-Invasive Vascular Imaging:   03/24/2020 Right Carotid: Velocities in the right ICA are consistent with a 60-79% stenosis.   Left Carotid: Velocities in the left ICA are consistent with a 1-39% stenosis.   Vertebrals: Bilateral vertebral arteries demonstrate antegrade flow.  Subclavians: Normal flow hemodynamics were seen in bilateral subclavian arteries.   ASSESSMENT/PLAN:: 79 y.o. female here for follow up for bilateral carotid artery stenosis.  At her last examination, her right internal carotid artery had progressed to the range of 60 to 79% and this is stable on today's examination.  Her left ICA remains at 1 to 39%.  She has no symptoms and is maintained on Plavix for prior history of coronary artery stent placement.  We reviewed the signs and symptoms of stroke and encouraged her to seek immediate medical attention should these  occur.  We will follow-up in 6 months with carotid artery duplex.    Barbie Banner, PA-C [facility] [phone]  Clinic MD:   Donzetta Matters

## 2020-03-28 ENCOUNTER — Other Ambulatory Visit: Payer: Self-pay | Admitting: *Deleted

## 2020-03-28 DIAGNOSIS — I6523 Occlusion and stenosis of bilateral carotid arteries: Secondary | ICD-10-CM

## 2020-03-29 DIAGNOSIS — M7061 Trochanteric bursitis, right hip: Secondary | ICD-10-CM | POA: Diagnosis not present

## 2020-03-29 DIAGNOSIS — M25552 Pain in left hip: Secondary | ICD-10-CM | POA: Diagnosis not present

## 2020-03-29 DIAGNOSIS — M25551 Pain in right hip: Secondary | ICD-10-CM | POA: Diagnosis not present

## 2020-03-31 ENCOUNTER — Ambulatory Visit: Payer: Medicare PPO | Attending: Critical Care Medicine

## 2020-03-31 DIAGNOSIS — Z23 Encounter for immunization: Secondary | ICD-10-CM

## 2020-03-31 NOTE — Progress Notes (Signed)
   Covid-19 Vaccination Clinic  Name:  [name]    MRN: [id number] DOB: Oct 30, 1940  03/31/2020  [name] was observed post Covid-19 immunization for 15 minutes without incident. She was provided with Vaccine Information Sheet and instruction to access the V-Safe system.   [name] was instructed to call 911 with any severe reactions post vaccine: Marland Kitchen Difficulty breathing  . Swelling of face and throat  . A fast heartbeat  . A bad rash all over body  . Dizziness and weakness

## 2020-04-19 DIAGNOSIS — M7061 Trochanteric bursitis, right hip: Secondary | ICD-10-CM | POA: Diagnosis not present

## 2020-04-19 DIAGNOSIS — M13851 Other specified arthritis, right hip: Secondary | ICD-10-CM | POA: Diagnosis not present

## 2020-05-23 DIAGNOSIS — T783XXA Angioneurotic edema, initial encounter: Secondary | ICD-10-CM | POA: Diagnosis not present

## 2020-05-23 DIAGNOSIS — J3089 Other allergic rhinitis: Secondary | ICD-10-CM | POA: Diagnosis not present

## 2020-05-23 DIAGNOSIS — J3081 Allergic rhinitis due to animal (cat) (dog) hair and dander: Secondary | ICD-10-CM | POA: Diagnosis not present

## 2020-05-23 DIAGNOSIS — L501 Idiopathic urticaria: Secondary | ICD-10-CM | POA: Diagnosis not present

## 2020-05-25 ENCOUNTER — Other Ambulatory Visit: Payer: Self-pay | Admitting: Cardiovascular Disease

## 2020-06-22 DIAGNOSIS — N3281 Overactive bladder: Secondary | ICD-10-CM | POA: Diagnosis not present

## 2020-06-22 DIAGNOSIS — E663 Overweight: Secondary | ICD-10-CM | POA: Diagnosis not present

## 2020-06-22 DIAGNOSIS — I700 Atherosclerosis of aorta: Secondary | ICD-10-CM | POA: Diagnosis not present

## 2020-06-22 DIAGNOSIS — I6529 Occlusion and stenosis of unspecified carotid artery: Secondary | ICD-10-CM | POA: Diagnosis not present

## 2020-06-22 DIAGNOSIS — Z889 Allergy status to unspecified drugs, medicaments and biological substances status: Secondary | ICD-10-CM | POA: Diagnosis not present

## 2020-06-22 DIAGNOSIS — I1310 Hypertensive heart and chronic kidney disease without heart failure, with stage 1 through stage 4 chronic kidney disease, or unspecified chronic kidney disease: Secondary | ICD-10-CM | POA: Diagnosis not present

## 2020-06-22 DIAGNOSIS — I2510 Atherosclerotic heart disease of native coronary artery without angina pectoris: Secondary | ICD-10-CM | POA: Diagnosis not present

## 2020-06-22 DIAGNOSIS — N1831 Chronic kidney disease, stage 3a: Secondary | ICD-10-CM | POA: Diagnosis not present

## 2020-06-22 DIAGNOSIS — E785 Hyperlipidemia, unspecified: Secondary | ICD-10-CM | POA: Diagnosis not present

## 2020-08-22 ENCOUNTER — Other Ambulatory Visit: Payer: Self-pay | Admitting: Physician Assistant

## 2020-08-22 DIAGNOSIS — I6523 Occlusion and stenosis of bilateral carotid arteries: Secondary | ICD-10-CM

## 2020-09-20 ENCOUNTER — Encounter (HOSPITAL_COMMUNITY): Payer: Medicare PPO

## 2020-09-20 ENCOUNTER — Ambulatory Visit: Payer: Medicare PPO

## 2020-09-20 DIAGNOSIS — L718 Other rosacea: Secondary | ICD-10-CM | POA: Diagnosis not present

## 2020-09-20 DIAGNOSIS — L821 Other seborrheic keratosis: Secondary | ICD-10-CM | POA: Diagnosis not present

## 2020-09-20 DIAGNOSIS — L814 Other melanin hyperpigmentation: Secondary | ICD-10-CM | POA: Diagnosis not present

## 2020-09-20 DIAGNOSIS — D72829 Elevated white blood cell count, unspecified: Secondary | ICD-10-CM | POA: Insufficient documentation

## 2020-09-20 DIAGNOSIS — D1801 Hemangioma of skin and subcutaneous tissue: Secondary | ICD-10-CM | POA: Diagnosis not present

## 2020-09-20 DIAGNOSIS — L570 Actinic keratosis: Secondary | ICD-10-CM | POA: Diagnosis not present

## 2020-09-20 DIAGNOSIS — D225 Melanocytic nevi of trunk: Secondary | ICD-10-CM | POA: Diagnosis not present

## 2020-09-22 ENCOUNTER — Ambulatory Visit: Payer: Medicare PPO | Admitting: Physician Assistant

## 2020-09-22 ENCOUNTER — Other Ambulatory Visit: Payer: Self-pay

## 2020-09-22 ENCOUNTER — Ambulatory Visit (HOSPITAL_COMMUNITY)
Admission: RE | Admit: 2020-09-22 | Discharge: 2020-09-22 | Disposition: A | Discharge status: Home / Self Care | Payer: Medicare PPO | Source: Ambulatory Visit | Attending: Vascular Surgery | Admitting: Vascular Surgery

## 2020-09-22 VITALS — BP 146/77 | HR 63 | Temp 98.0°F | Resp 20 | Ht 63.0 in | Wt 159.5 lb

## 2020-09-22 DIAGNOSIS — I6523 Occlusion and stenosis of bilateral carotid arteries: Secondary | ICD-10-CM | POA: Diagnosis not present

## 2020-09-22 NOTE — Progress Notes (Signed)
Office Note     CC:  follow up Requesting Provider:  Shon Baton, MD  HPI: [name] is a 80 y.o. (1941/04/05) female who presents for surveillance follow up of carotid artery stenosis. She has no history of TIA or stroke. She has been followed since 2015 with Right ICA stenosis of 60-79% and left ICA stenosis of 1-39%. She denies any monocular vision loss or amaurosis fugax, slurred speech, facial drooping, unilateral upper or lower extremity weakness or numbness. She does state that she has had some recent intermittent episodes of light headedness when going from sitting to standing quickly. Also she reports that she has to get up frequently at night to void and recently she has had recurrent events where she feels a little unbalanced and dizzy and feels like she is leaning towards the right and it takes her a minute to get her bearings. She expresses concern about this having to due with her carotid arteries. She otherwise has no concerns today and says she has otherwise been feeling well. She is maintained on Aspirin, Plavix and statin.   The pt is on a statin for cholesterol management.  The pt is on a daily aspirin.   Other AC:  Plavix for coronary artery stents The pt is on CCB  for hypertension.   The pt  Is not diabetic.   Tobacco hx: never smoker  Past Medical History:  Diagnosis Date  . Allergy   . Arthritis   . CAD (coronary artery disease)    a. DES to LAD, diagonal 10/2006. b. DES to RCA 05/2007. c. Last cath 11/2012: patent stents, stable dz. d. Carlton Adam cardiolite negative 12/2012.  . Cataract    left eye  . Diverticulitis   . Diverticulosis of colon   . GERD (gastroesophageal reflux disease)   . Hemorrhoids   . History of recurrent UTIs   . Hx of adenomatous colonic polyps    tubular  . Hyperlipidemia   . Hypertension   . Peripheral vascular disease ([place])   . Sleep apnea    wears CPAP- 03/07/2020 no longer wears CPAP    Past Surgical History:  Procedure  Laterality Date  . ANGIOPLASTY  5/08  and  12/08    stent x3  . Cataract left    . COLONOSCOPY    . EYE SURGERY Left    laser eye surgery for blood leakage  . FOOT TENDON SURGERY Left Sept. 20, 2016    Left Calf / foot -   Dr. Sharol Given  . MASS EXCISION Right 07/13/2019   Procedure: EXCISION VOLAR CYST RIGHT WRIST;  Surgeon: Daryll Brod, MD;  Location: [facility];  Service: Orthopedics;  Laterality: Right;  IV REGIONAL FOREARM BLOCK  . Right shoulder surgery    . Vitrectomy left eye      Social History   Socioeconomic History  . Marital status: Married    Spouse name: Not on file  . Number of children: 0  . Years of education: Not on file  . Highest education level: Not on file  Occupational History  . Occupation: retired     Fish farm manager: RETIRED  Tobacco Use  . Smoking status: Never Smoker  . Smokeless tobacco: Never Used  Vaping Use  . Vaping Use: Never used  Substance and Sexual Activity  . Alcohol use: Yes    Alcohol/week: 2.0 - 3.0 standard drinks    Types: 2 - 3 Standard drinks or equivalent per week    Comment:  1 per day  . Drug use: No  . Sexual activity: Not on file  Other Topics Concern  . Not on file  Social History Narrative  . Not on file   Social Determinants of Health   Financial Resource Strain: Not on file  Food Insecurity: Not on file  Transportation Needs: Not on file  Physical Activity: Not on file  Stress: Not on file  Social Connections: Not on file  Intimate Partner Violence: Not on file    Family History  Problem Relation Age of Onset  . Colon polyps Brother   . Heart disease Brother        before age 4  . Hyperlipidemia Brother   . Prostate cancer Brother   . Heart disease Father        before age 11  . Deep vein thrombosis Father   . Hyperlipidemia Father   . Heart attack Father 70  . Stroke Father   . Heart disease Brother   . Heart attack Brother   . Cancer Brother        Merkle cell  . Stroke Mother         TIA's  . Dementia Mother   . Ovarian cancer Maternal Grandmother   . Colon polyps Paternal Grandfather   . Liver cancer Paternal Grandfather   . Heart disease Other        Grandmother   . Colon cancer Neg Hx   . Esophageal cancer Neg Hx   . Stomach cancer Neg Hx   . Rectal cancer Neg Hx     Current Outpatient Medications  Medication Sig Dispense Refill  . amLODipine (NORVASC) 2.5 MG tablet TAKE 1 TABLET BY MOUTH EVERY DAY 90 tablet 3  . aspirin 81 MG tablet Take 1 tablet (81 mg total) by mouth daily.    . cholecalciferol (VITAMIN D) 1000 UNITS tablet Take 1,000 Units by mouth daily.    . clopidogrel (PLAVIX) 75 MG tablet TAKE 1 TABLET BY MOUTH EVERY DAY 90 tablet 3  . EPINEPHrine 0.3 mg/0.3 mL IJ SOAJ injection See admin instructions.    . fish oil-omega-3 fatty acids 1000 MG capsule Take 1 g by mouth 2 (two) times daily.    . fluticasone (FLONASE) 50 MCG/ACT nasal spray Place 1 spray into both nostrils as needed for allergies.   2  . levocetirizine (XYZAL) 5 MG tablet Take 1 tablet by mouth daily.    . Multiple Vitamin (MULTIVITAMIN WITH MINERALS) TABS Take 1 tablet by mouth daily.    . nitroGLYCERIN (NITROSTAT) 0.4 MG SL tablet Place 1 tablet (0.4 mg total) under the tongue every 5 (five) minutes as needed for chest pain. 25 tablet 5  . Psyllium 30.9 % POWD Take by mouth 1 day or 1 dose.    . rosuvastatin (CRESTOR) 40 MG tablet TAKE 1 TABLET BY MOUTH EVERY OTHER DAY 45 tablet 3  . vitamin B-12 (CYANOCOBALAMIN) 1000 MCG tablet Take 1,000 mcg by mouth daily.    . vitamin C (ASCORBIC ACID) 500 MG tablet Take 500 mg by mouth daily.     No current facility-administered medications for this visit.    Allergies  Allergen Reactions  . Other Hives, Itching and Swelling    VICRYL STITCHES Tongue swelling Other reaction(s): Unknown  . Fluoride Preparations Other (See Comments)    A test for eye to make pictures, (  causing pressure of Left arm )     REVIEW OF SYSTEMS:  [X]  denotes  positive  finding, [ ]  denotes negative finding Cardiac  Comments:  Chest pain or chest pressure:    Shortness of breath upon exertion:    Short of breath when lying flat:    Irregular heart rhythm:        Vascular    Pain in calf, thigh, or hip brought on by ambulation:    Pain in feet at night that wakes you up from your sleep:     Blood clot in your veins:    Leg swelling:         Pulmonary    Oxygen at home:    Productive cough:     Wheezing:         Neurologic    Sudden weakness in arms or legs:     Sudden numbness in arms or legs:     Sudden onset of difficulty speaking or slurred speech:    Temporary loss of vision in one eye:     Problems with dizziness:         Gastrointestinal    Blood in stool:     Vomited blood:         Genitourinary    Burning when urinating:     Blood in urine:        Psychiatric    Major depression:         Hematologic    Bleeding problems:    Problems with blood clotting too easily:        Skin    Rashes or ulcers:        Constitutional    Fever or chills:      PHYSICAL EXAMINATION:  Vitals:   09/22/20 1034 09/22/20 1036  BP: 136/67 (!) 146/77  Pulse: 63   Resp: 20   Temp: 98 F (36.7 C)   TempSrc: Temporal   SpO2: 97%   Weight: 159 lb 8 oz (72.3 kg)   Height: 5\' 3"  (1.6 m)     General:  WDWN in NAD; vital signs documented above Gait: Normal HENT: WNL, normocephalic Pulmonary: normal non-labored breathing , without Rales, rhonchi,  wheezing Cardiac: regular HR, without  Murmurs without carotid bruit Abdomen: soft, NT, no masses Vascular Exam/Pulses:  Right Left  Radial 2+ (normal) 2+ (normal)  Femoral 2+ (normal) 2+ (normal)  Popliteal 2+ (normal) 2+ (normal)  DP 2+ (normal) 2+ (normal)  PT 2+ (normal) 2+ (normal)   Extremities: without ischemic changes, without Gangrene , without cellulitis; without open wounds;  Musculoskeletal: no muscle wasting or atrophy  Neurologic: A&O X 3;  No focal weakness or  paresthesias are detected Psychiatric:  The pt has Normal affect.   Non-Invasive Vascular Imaging:   Bilateral Carotid Artery Duplex: 09/22/20 Right Carotid: Velocities in the right ICA are consistent with a 60-79% stenosis.proximally and 40-59% in the mid portion.   Left Carotid: Velocities in the left ICA are consistent with a 1-39% stenosis.   Vertebrals: Bilateral vertebral arteries demonstrate antegrade flow.  Subclavians: Normal flow hemodynamics were seen in bilateral subclavian arteries.    ASSESSMENT/PLAN:: 80 y.o. female here for follow up for carotid artery stenosis. She remains without any new neurological symptoms associated with her carotid disease. Her duplex today is essentially unchanged with 60-79% in right ICA and 1-39% left ICA stenosis. Normal antegrade flow in vertebral arteries and normal flow in subclavian arteries bilaterally.  - she will continue her aspirin, Statin and Plavix - reviewed signs and symptoms of TIA/ Stroke with her and should these occur  she knows to present to the ER - She will follow up in 6 months with repeat carotid duplex   Karoline Caldwell, PA-C [facility] [phone]  Clinic MD:  Donzetta Matters

## 2020-09-27 ENCOUNTER — Other Ambulatory Visit: Payer: Self-pay

## 2020-09-27 DIAGNOSIS — I6523 Occlusion and stenosis of bilateral carotid arteries: Secondary | ICD-10-CM

## 2020-12-01 DIAGNOSIS — Z0000 Encounter for general adult medical examination without abnormal findings: Secondary | ICD-10-CM | POA: Diagnosis not present

## 2020-12-01 DIAGNOSIS — E785 Hyperlipidemia, unspecified: Secondary | ICD-10-CM | POA: Diagnosis not present

## 2020-12-08 DIAGNOSIS — I2510 Atherosclerotic heart disease of native coronary artery without angina pectoris: Secondary | ICD-10-CM | POA: Diagnosis not present

## 2020-12-08 DIAGNOSIS — Z23 Encounter for immunization: Secondary | ICD-10-CM | POA: Diagnosis not present

## 2020-12-08 DIAGNOSIS — E663 Overweight: Secondary | ICD-10-CM | POA: Diagnosis not present

## 2020-12-08 DIAGNOSIS — D692 Other nonthrombocytopenic purpura: Secondary | ICD-10-CM | POA: Diagnosis not present

## 2020-12-08 DIAGNOSIS — N1831 Chronic kidney disease, stage 3a: Secondary | ICD-10-CM | POA: Diagnosis not present

## 2020-12-08 DIAGNOSIS — I1310 Hypertensive heart and chronic kidney disease without heart failure, with stage 1 through stage 4 chronic kidney disease, or unspecified chronic kidney disease: Secondary | ICD-10-CM | POA: Diagnosis not present

## 2020-12-08 DIAGNOSIS — I700 Atherosclerosis of aorta: Secondary | ICD-10-CM | POA: Diagnosis not present

## 2020-12-08 DIAGNOSIS — Z0000 Encounter for general adult medical examination without abnormal findings: Secondary | ICD-10-CM | POA: Diagnosis not present

## 2020-12-08 DIAGNOSIS — E785 Hyperlipidemia, unspecified: Secondary | ICD-10-CM | POA: Diagnosis not present

## 2020-12-08 DIAGNOSIS — M8580 Other specified disorders of bone density and structure, unspecified site: Secondary | ICD-10-CM | POA: Diagnosis not present

## 2020-12-12 DIAGNOSIS — H26492 Other secondary cataract, left eye: Secondary | ICD-10-CM | POA: Diagnosis not present

## 2020-12-12 DIAGNOSIS — H52203 Unspecified astigmatism, bilateral: Secondary | ICD-10-CM | POA: Diagnosis not present

## 2020-12-12 DIAGNOSIS — H524 Presbyopia: Secondary | ICD-10-CM | POA: Diagnosis not present

## 2020-12-12 DIAGNOSIS — H2511 Age-related nuclear cataract, right eye: Secondary | ICD-10-CM | POA: Diagnosis not present

## 2020-12-22 DIAGNOSIS — M8589 Other specified disorders of bone density and structure, multiple sites: Secondary | ICD-10-CM | POA: Diagnosis not present

## 2021-01-08 ENCOUNTER — Other Ambulatory Visit: Payer: Self-pay | Admitting: Internal Medicine

## 2021-01-08 DIAGNOSIS — Z1231 Encounter for screening mammogram for malignant neoplasm of breast: Secondary | ICD-10-CM

## 2021-02-07 NOTE — Progress Notes (Signed)
Chief Complaint  Patient presents with   Follow-up    CAD    History of Present Illness: 80 yo female with history of CAD with previous stenting of the mid LAD, Diagonal and Proximal and mid RCA, HTN, HLD, OSA, carotid artery disease here today for follow up. Her cardiac history includes drug eluting stents placed in the LAD and Diagonal in May 2008, followed by drug eluting stent placement in the RCA in December 2008. At her initial visit in our office November 2012, she had c/o left arm pain and her prior anginal equivalent was left arm pain. Stress test in November 2012 with no ischemia. I saw her June 2013 and she c/o recurrence of pain in her left arm with exertion associated with fatigue and SOB. Cardiac cath June 2013 with stable disease, patent stents LAD, Diagonal, RCA. Admitted July 2014 with left shoulder pain. Stress myoview 12/28/12 with no ischemia. She had some left sided weakness in March 2015 and was seen in [facility] and was found to have moderate carotid artery disease. She has been followed in the [facility] office for her carotid disease. Echo November 2020 with LVEF=60-65%. No significant valve disease.  Nuclear stress test April 2021 with no ischemia.   She is here today for follow up. The patient denies any chest pain, dyspnea, palpitations, lower extremity edema, orthopnea, PND, dizziness, near syncope or syncope. She is very active.    Primary Care Physician: Shon Baton, MD  Past Medical History:  Diagnosis Date   Allergy    Arthritis    CAD (coronary artery disease)    a. DES to LAD, diagonal 10/2006. b. DES to RCA 05/2007. c. Last cath 11/2012: patent stents, stable dz. d. Carlton Adam cardiolite negative 12/2012.   Cataract    left eye   Diverticulitis    Diverticulosis of colon    GERD (gastroesophageal reflux disease)    Hemorrhoids    History of recurrent UTIs    Hx of adenomatous colonic polyps    tubular   Hyperlipidemia    Hypertension    Peripheral vascular disease  ([place])    Sleep apnea    wears CPAP- 03/07/2020 no longer wears CPAP    Past Surgical History:  Procedure Laterality Date   ANGIOPLASTY  5/08  and  12/08    stent x3   Cataract left     COLONOSCOPY     EYE SURGERY Left    laser eye surgery for blood leakage   FOOT TENDON SURGERY Left Sept. 20, 2016    Left Calf / foot -   Dr. Sharol Given   MASS EXCISION Right 07/13/2019   Procedure: EXCISION VOLAR CYST RIGHT WRIST;  Surgeon: Daryll Brod, MD;  Location: [facility];  Service: Orthopedics;  Laterality: Right;  IV REGIONAL FOREARM BLOCK   Right shoulder surgery     Vitrectomy left eye      Current Outpatient Medications  Medication Sig Dispense Refill   amLODipine (NORVASC) 2.5 MG tablet TAKE 1 TABLET BY MOUTH EVERY DAY 90 tablet 3   cholecalciferol (VITAMIN D) 1000 UNITS tablet Take 1,000 Units by mouth daily.     EPINEPHrine 0.3 mg/0.3 mL IJ SOAJ injection See admin instructions.     fish oil-omega-3 fatty acids 1000 MG capsule Take 1 g by mouth 2 (two) times daily.     fluticasone (FLONASE) 50 MCG/ACT nasal spray Place 1 spray into both nostrils as needed for allergies.   2   levocetirizine (XYZAL)  5 MG tablet Take 1 tablet by mouth daily.     Multiple Vitamin (MULTIVITAMIN WITH MINERALS) TABS Take 1 tablet by mouth daily.     Psyllium 30.9 % POWD Take by mouth 1 day or 1 dose.     rosuvastatin (CRESTOR) 40 MG tablet TAKE 1 TABLET BY MOUTH EVERY OTHER DAY 45 tablet 3   vitamin B-12 (CYANOCOBALAMIN) 1000 MCG tablet Take 1,000 mcg by mouth daily.     vitamin C (ASCORBIC ACID) 500 MG tablet Take 500 mg by mouth daily.     clopidogrel (PLAVIX) 75 MG tablet Take 1 tablet (75 mg total) by mouth daily. 90 tablet 3   nitroGLYCERIN (NITROSTAT) 0.4 MG SL tablet Place 1 tablet (0.4 mg total) under the tongue every 5 (five) minutes as needed for chest pain. 25 tablet 5   No current facility-administered medications for this visit.    Allergies  Allergen Reactions   Other Hives,  Itching and Swelling    VICRYL STITCHES Tongue swelling Other reaction(s): Unknown   Fluoride Preparations Other (See Comments)    A test for eye to make pictures, (  causing pressure of Left arm )    Social History   Socioeconomic History   Marital status: Married    Spouse name: Not on file   Number of children: 0   Years of education: Not on file   Highest education level: Not on file  Occupational History   Occupation: retired     Fish farm manager: RETIRED  Tobacco Use   Smoking status: Never   Smokeless tobacco: Never  Vaping Use   Vaping Use: Never used  Substance and Sexual Activity   Alcohol use: Yes    Alcohol/week: 2.0 - 3.0 standard drinks    Types: 2 - 3 Standard drinks or equivalent per week    Comment: 1 per day   Drug use: No   Sexual activity: Not on file  Other Topics Concern   Not on file  Social History Narrative   Not on file   Social Determinants of Health   Financial Resource Strain: Not on file  Food Insecurity: Not on file  Transportation Needs: Not on file  Physical Activity: Not on file  Stress: Not on file  Social Connections: Not on file  Intimate Partner Violence: Not on file    Family History  Problem Relation Age of Onset   Colon polyps Brother    Heart disease Brother        before age 63   Hyperlipidemia Brother    Prostate cancer Brother    Heart disease Father        before age 5   Deep vein thrombosis Father    Hyperlipidemia Father    Heart attack Father 5   Stroke Father    Heart disease Brother    Heart attack Brother    Cancer Brother        Merkle cell   Stroke Mother        TIA's   Dementia Mother    Ovarian cancer Maternal Grandmother    Colon polyps Paternal Grandfather    Liver cancer Paternal Grandfather    Heart disease Other        Grandmother    Colon cancer Neg Hx    Esophageal cancer Neg Hx    Stomach cancer Neg Hx    Rectal cancer Neg Hx     Review of Systems:  As stated in the HPI and  otherwise  negative.   BP 138/88   Pulse 78   Ht '5\' 3"'$  (1.6 m)   Wt 160 lb 3.2 oz (72.7 kg)   SpO2 96%   BMI 28.38 kg/m   Physical Examination:  General: Well developed, well nourished, NAD  HEENT: OP clear, mucus membranes moist  SKIN: warm, dry. No rashes. Neuro: No focal deficits  Musculoskeletal: Muscle strength 5/5 all ext  Psychiatric: Mood and affect normal  Neck: No JVD, no carotid bruits, no thyromegaly, no lymphadenopathy.  Lungs:Clear bilaterally, no wheezes, rhonci, crackles Cardiovascular: Regular rate and rhythm. No murmurs, gallops or rubs. Abdomen:Soft. Bowel sounds present. Non-tender.  Extremities: No lower extremity edema. Pulses are 2 + in the bilateral DP/PT.  Cardiac cath 11/27/11:  Left main:  Left Anterior Descending Artery: Large caliber vessel that courses to the apex. There is a patent stent in the proximal to mid LAD with minimal, 20% in stent restenosis. The diagonal branch arises just before the LAD stent. There is a patent stent in the ostium and proximal segment of the diagonal branch with perhaps 20% ostial stenosis.  Circumflex Artery: Moderate sized vessel with no disease noted. Small early OM branch with no disease.  Right Coronary Artery: Large, dominant vessel with patent stent in the proximal vessel. Just before the stent, there is a 20% plaque. Just beyond the stent, there is a 30% plaque.  Left Ventricular Angiogram: LVEF 65-70%.   EKG:  EKG is ordered today. The ekg ordered today demonstrates Sinus  Recent Labs: No results found for requested labs within last 8760 hours.   Lipid Panel Followed in primary care   Wt Readings from Last 3 Encounters:  02/08/21 160 lb 3.2 oz (72.7 kg)  09/22/20 159 lb 8 oz (72.3 kg)  03/24/20 160 lb 3.2 oz (72.7 kg)     Other studies Reviewed: Additional studies/ records that were reviewed today include: . Review of the above records demonstrates:    Assessment and Plan:   1. CAD without angina:  She has had stents placed in the LAD, Diagonal and RCA. Last cath in June 2013 with stable CAD.  Nuclear stress test April 2021 with no ischemia. She has no chest pain. Continue ASA, Plavix and statin.  We discussed stopping either ASA or Plavix. She will stop ASA.   2. HTN: BP is controlled. No change in therapy  3. HYPERLIPIDEMIA: Lipids followed in primary care. LDL 68 in June 2022. Will continue statin  4. Carotid artery disease: Followed in [facility]  Current medicines are reviewed at length with the patient today.  The patient does not have concerns regarding medicines.  The following changes have been made:  no change  Labs/ tests ordered today include:   Orders Placed This Encounter  Procedures   EKG 12-Lead     Disposition:   FU with me in 12  months  Signed, Lauree Chandler, MD 02/08/2021 9:10 AM    [facility] [address], [place]  [address] Phone: [phone]; Fax: [phone]

## 2021-02-08 ENCOUNTER — Ambulatory Visit: Payer: Medicare PPO | Admitting: Cardiovascular Disease

## 2021-02-08 ENCOUNTER — Other Ambulatory Visit: Payer: Self-pay

## 2021-02-08 ENCOUNTER — Encounter: Payer: Self-pay | Admitting: Cardiovascular Disease

## 2021-02-08 VITALS — BP 138/88 | HR 78 | Ht 63.0 in | Wt 160.2 lb

## 2021-02-08 DIAGNOSIS — I2510 Atherosclerotic heart disease of native coronary artery without angina pectoris: Secondary | ICD-10-CM

## 2021-02-08 DIAGNOSIS — E7800 Pure hypercholesterolemia, unspecified: Secondary | ICD-10-CM | POA: Diagnosis not present

## 2021-02-08 DIAGNOSIS — I10 Essential (primary) hypertension: Secondary | ICD-10-CM | POA: Diagnosis not present

## 2021-02-08 DIAGNOSIS — I6523 Occlusion and stenosis of bilateral carotid arteries: Secondary | ICD-10-CM

## 2021-02-08 MED ORDER — NITROGLYCERIN 0.4 MG SL SUBL: 0.4000 mg | SUBLINGUAL_TABLET | SUBLINGUAL | 5 refills | Status: DC | PRN

## 2021-02-08 MED ORDER — CLOPIDOGREL BISULFATE 75 MG PO TABS: 75.0000 mg | ORAL_TABLET | Freq: Every day | ORAL | 3 refills | Status: DC

## 2021-02-08 NOTE — Patient Instructions (Addendum)
Medication Instructions:  Your physician has recommended you make the following change in your medication:   STOP Aspirin  *If you need a refill on your cardiac medications before your next appointment, please call your pharmacy*   Lab Work: None ordered  If you have labs (blood work) drawn today and your tests are completely normal, you will receive your results only by: [place] (if you have MyChart) OR A paper copy in the mail If you have any lab test that is abnormal or we need to change your treatment, we will call you to review the results.   Testing/Procedures: None ordered   Follow-Up: At [facility], you and your health needs are our priority.  As part of our continuing mission to provide you with exceptional heart care, we have created designated Provider Care Teams.  These Care Teams include your primary Cardiologist (physician) and Advanced Practice Providers (APPs -  Physician Assistants and Nurse Practitioners) who all work together to provide you with the care you need, when you need it.  We recommend signing up for the patient portal called "MyChart".  Sign up information is provided on this After Visit Summary.  MyChart is used to connect with patients for Virtual Visits (Telemedicine).  Patients are able to view lab/test results, encounter notes, upcoming appointments, etc.  Non-urgent messages can be sent to your provider as well.   To learn more about what you can do with MyChart, go to [email or web address].    Your next appointment:   12 month(s)  The format for your next appointment:   In Person  Provider:   Lauree Chandler, MD   Other Instructions

## 2021-02-28 ENCOUNTER — Ambulatory Visit
Admission: RE | Admit: 2021-02-28 | Discharge: 2021-02-28 | Disposition: A | Discharge status: Home / Self Care | Payer: Medicare PPO | Source: Ambulatory Visit | Attending: Internal Medicine | Admitting: Internal Medicine

## 2021-02-28 ENCOUNTER — Other Ambulatory Visit: Payer: Self-pay

## 2021-02-28 DIAGNOSIS — Z1231 Encounter for screening mammogram for malignant neoplasm of breast: Secondary | ICD-10-CM

## 2021-03-22 DIAGNOSIS — M13851 Other specified arthritis, right hip: Secondary | ICD-10-CM | POA: Diagnosis not present

## 2021-03-22 DIAGNOSIS — M25561 Pain in right knee: Secondary | ICD-10-CM | POA: Diagnosis not present

## 2021-03-22 DIAGNOSIS — M7061 Trochanteric bursitis, right hip: Secondary | ICD-10-CM | POA: Diagnosis not present

## 2021-03-27 ENCOUNTER — Ambulatory Visit (HOSPITAL_COMMUNITY)
Admission: RE | Admit: 2021-03-27 | Discharge: 2021-03-27 | Disposition: A | Discharge status: Home / Self Care | Payer: Medicare PPO | Source: Ambulatory Visit | Attending: Physician Assistant | Admitting: Physician Assistant

## 2021-03-27 ENCOUNTER — Other Ambulatory Visit: Payer: Self-pay

## 2021-03-27 ENCOUNTER — Ambulatory Visit (INDEPENDENT_AMBULATORY_CARE_PROVIDER_SITE_OTHER): Payer: Medicare PPO | Admitting: Physician Assistant

## 2021-03-27 VITALS — BP 140/84 | HR 66 | Temp 97.6°F | Resp 16 | Ht 63.0 in | Wt 160.6 lb

## 2021-03-27 DIAGNOSIS — I6523 Occlusion and stenosis of bilateral carotid arteries: Secondary | ICD-10-CM | POA: Insufficient documentation

## 2021-03-27 NOTE — Progress Notes (Signed)
History of Present Illness:  Patient is a 80 y.o. year old female who presents for evaluation of carotid stenosis.  The patient denies symptoms of TIA, amaurosis, or stroke.  She has been followed since 2015 with Right ICA stenosis of 60-79% and left ICA stenosis of 1-39%.    She has history of coronary disease requiring stents.     She stays active with walking 2-3 miles daily, gardening and being social.    The pt is on a statin for cholesterol management.  The pt is on a daily aspirin.   Other AC:  Plavix for coronary artery stents The pt is on CCB  for hypertension.   The pt  Is not diabetic.   Tobacco hx: never smoker      Past Medical History:  Diagnosis Date   Allergy    Arthritis    CAD (coronary artery disease)    a. DES to LAD, diagonal 10/2006. b. DES to RCA 05/2007. c. Last cath 11/2012: patent stents, stable dz. d. Carlton Adam cardiolite negative 12/2012.   Cataract    left eye   Diverticulitis    Diverticulosis of colon    GERD (gastroesophageal reflux disease)    Hemorrhoids    History of recurrent UTIs    Hx of adenomatous colonic polyps    tubular   Hyperlipidemia    Hypertension    Peripheral vascular disease ([place])    Sleep apnea    wears CPAP- 03/07/2020 no longer wears CPAP    Past Surgical History:  Procedure Laterality Date   ANGIOPLASTY  5/08  and  12/08    stent x3   Cataract left     COLONOSCOPY     EYE SURGERY Left    laser eye surgery for blood leakage   FOOT TENDON SURGERY Left Sept. 20, 2016    Left Calf / foot -   Dr. Sharol Given   MASS EXCISION Right 07/13/2019   Procedure: EXCISION VOLAR CYST RIGHT WRIST;  Surgeon: Daryll Brod, MD;  Location: [facility];  Service: Orthopedics;  Laterality: Right;  IV REGIONAL FOREARM BLOCK   Right shoulder surgery     Vitrectomy left eye       Social History Social History   Tobacco Use   Smoking status: Never   Smokeless tobacco: Never  Vaping Use   Vaping Use: Never used   Substance Use Topics   Alcohol use: Yes    Alcohol/week: 2.0 - 3.0 standard drinks    Types: 2 - 3 Standard drinks or equivalent per week    Comment: 1 per day   Drug use: No    Family History Family History  Problem Relation Age of Onset   Colon polyps Brother    Heart disease Brother        before age 28   Hyperlipidemia Brother    Prostate cancer Brother    Heart disease Father        before age 71   Deep vein thrombosis Father    Hyperlipidemia Father    Heart attack Father 67   Stroke Father    Heart disease Brother    Heart attack Brother    Cancer Brother        Merkle cell   Stroke Mother        TIA's   Dementia Mother    Ovarian cancer Maternal Grandmother    Colon polyps Paternal Grandfather    Liver cancer Paternal Grandfather  Heart disease Other        Grandmother    Colon cancer Neg Hx    Esophageal cancer Neg Hx    Stomach cancer Neg Hx    Rectal cancer Neg Hx     Allergies  Allergies  Allergen Reactions   Other Hives, Itching and Swelling    VICRYL STITCHES Tongue swelling Other reaction(s): Unknown   Fluoride Preparations Other (See Comments)    A test for eye to make pictures, (  causing pressure of Left arm )     Current Outpatient Medications  Medication Sig Dispense Refill   amLODipine (NORVASC) 2.5 MG tablet TAKE 1 TABLET BY MOUTH EVERY DAY 90 tablet 3   cholecalciferol (VITAMIN D) 1000 UNITS tablet Take 1,000 Units by mouth daily.     clopidogrel (PLAVIX) 75 MG tablet Take 1 tablet (75 mg total) by mouth daily. 90 tablet 3   EPINEPHrine 0.3 mg/0.3 mL IJ SOAJ injection See admin instructions.     fish oil-omega-3 fatty acids 1000 MG capsule Take 1 g by mouth 2 (two) times daily.     fluticasone (FLONASE) 50 MCG/ACT nasal spray Place 1 spray into both nostrils as needed for allergies.   2   levocetirizine (XYZAL) 5 MG tablet Take 1 tablet by mouth daily.     Multiple Vitamin (MULTIVITAMIN WITH MINERALS) TABS Take 1 tablet by mouth  daily.     nitroGLYCERIN (NITROSTAT) 0.4 MG SL tablet Place 1 tablet (0.4 mg total) under the tongue every 5 (five) minutes as needed for chest pain. 25 tablet 5   Psyllium 30.9 % POWD Take by mouth 1 day or 1 dose.     rosuvastatin (CRESTOR) 40 MG tablet TAKE 1 TABLET BY MOUTH EVERY OTHER DAY 45 tablet 3   vitamin B-12 (CYANOCOBALAMIN) 1000 MCG tablet Take 1,000 mcg by mouth daily.     vitamin C (ASCORBIC ACID) 500 MG tablet Take 500 mg by mouth daily.     No current facility-administered medications for this visit.    ROS:   General:  No weight loss, Fever, chills  HEENT: No recent headaches, no nasal bleeding, no visual changes, no sore throat  Neurologic: No dizziness, blackouts, seizures. No recent symptoms of stroke or mini- stroke. No recent episodes of slurred speech, or temporary blindness.  Cardiac: No recent episodes of chest pain/pressure, no shortness of breath at rest.  No shortness of breath with exertion.  Denies history of atrial fibrillation or irregular heartbeat  Vascular: No history of rest pain in feet.  No history of claudication.  No history of non-healing ulcer, No history of DVT   Pulmonary: No home oxygen, no productive cough, no hemoptysis,  No asthma or wheezing  Musculoskeletal:  [ ]  Arthritis, [ ]  Low back pain,  [ ]  Joint pain  Hematologic:No history of hypercoagulable state.  No history of easy bleeding.  No history of anemia  Gastrointestinal: No hematochezia or melena,  No gastroesophageal reflux, no trouble swallowing  Urinary: [ ]  chronic Kidney disease, [ ]  on HD - [ ]  MWF or [ ]  TTHS, [ ]  Burning with urination, [ ]  Frequent urination, [ ]  Difficulty urinating;   Skin: No rashes  Psychological: No history of anxiety,  No history of depression   Physical Examination  Vitals:   03/27/21 1442  BP: 140/84  Pulse: 66  Resp: 16  Temp: 97.6 F (36.4 C)  TempSrc: Temporal  SpO2: 97%  Weight: 160 lb 9.6 oz (72.8 kg)  Height: 5\' 3"  (1.6 m)     Body mass index is 28.45 kg/m.  General:  Alert and oriented, no acute distress HEENT: Normal Neck: No bruit or JVD Pulmonary: Clear to auscultation bilaterally Cardiac: Regular Rate and Rhythm without murmur Gastrointestinal: Soft, non-tender, non-distended, no mass, no scars Skin: No rash Extremity Pulses:  2+ radial, brachial, femoral, dorsalis pedis, pulses bilaterally Musculoskeletal: No deformity or edema  Neurologic: Upper and lower extremity motor 5/5 and symmetric  DATA:     Right Carotid Findings:  +----------+--------+--------+--------+------------------+-----------+            PSV cm/sEDV cm/sStenosisPlaque DescriptionComments     +----------+--------+--------+--------+------------------+-----------+  CCA Prox  83      17                                             +----------+--------+--------+--------+------------------+-----------+  CCA Distal56      16                                             +----------+--------+--------+--------+------------------+-----------+  ICA Prox  314     99      60-79%                    upper range  +----------+--------+--------+--------+------------------+-----------+  ICA Mid   224     73                                             +----------+--------+--------+--------+------------------+-----------+  ICA Distal194     50                                             +----------+--------+--------+--------+------------------+-----------+  ECA       83      14                                             +----------+--------+--------+--------+------------------+-----------+   +----------+--------+-------+----------------+-------------------+            PSV cm/sEDV cmsDescribe        Arm Pressure (mmHG)  +----------+--------+-------+----------------+-------------------+  [id number]            Multiphasic, WNL                      +----------+--------+-------+----------------+-------------------+   +---------+--------+--+--------+--+---------+  VertebralPSV cm/s64EDV cm/s12Antegrade  +---------+--------+--+--------+--+---------+       Left Carotid Findings:  +----------+--------+--------+--------+------------------+--------+            PSV cm/sEDV cm/sStenosisPlaque DescriptionComments  +----------+--------+--------+--------+------------------+--------+  CCA Prox  85      16                                          +----------+--------+--------+--------+------------------+--------+  CCA Distal58      20                                          +----------+--------+--------+--------+------------------+--------+  ICA Prox  77      20      1-39%                               +----------+--------+--------+--------+------------------+--------+  ICA Mid   79      26                                          +----------+--------+--------+--------+------------------+--------+  ICA Distal67      24                                          +----------+--------+--------+--------+------------------+--------+  ECA       71      8                                           +----------+--------+--------+--------+------------------+--------+   +----------+--------+--------+----------------+-------------------+            PSV cm/sEDV cm/sDescribe        Arm Pressure (mmHG)  +----------+--------+--------+----------------+-------------------+  [id number]             Multiphasic, WNL                     +----------+--------+--------+----------------+-------------------+   +---------+--------+--+--------+--+---------+  VertebralPSV cm/s50EDV cm/s10Antegrade  +---------+--------+--+--------+--+---------+     Summary:  Right Carotid: Velocities in the right ICA are consistent with a 60-79%                 stenosis. Upper end of range- velocities have  increased  since                 last exam.   Left Carotid: Velocities in the left ICA are consistent with a 1-39%  stenosis.   ASSESSMENT:  Asymptomatic carotid stenosis since 2015 The left ICA is < 39%, the right ICA shows increased EDV to 99 today on duplex.  PLAN: She will f/u in 6 months for repeat carotid duplex.  If she has > 80 % stenosis we will order a CTA.  If she develops signs or symptoms of stroke/TIA she will call 911.  She will continue to stay active, and continue to take daily Stain and Plavix.   Roxy Horseman PA-C [facility] Office: [phone]  MD in clinic [place]

## 2021-03-28 ENCOUNTER — Other Ambulatory Visit: Payer: Self-pay

## 2021-03-28 DIAGNOSIS — I6523 Occlusion and stenosis of bilateral carotid arteries: Secondary | ICD-10-CM

## 2021-04-11 DIAGNOSIS — M25551 Pain in right hip: Secondary | ICD-10-CM | POA: Diagnosis not present

## 2021-04-27 ENCOUNTER — Other Ambulatory Visit: Payer: Self-pay | Admitting: Cardiovascular Disease

## 2021-05-25 DIAGNOSIS — L501 Idiopathic urticaria: Secondary | ICD-10-CM | POA: Diagnosis not present

## 2021-05-25 DIAGNOSIS — J3089 Other allergic rhinitis: Secondary | ICD-10-CM | POA: Diagnosis not present

## 2021-05-25 DIAGNOSIS — J3081 Allergic rhinitis due to animal (cat) (dog) hair and dander: Secondary | ICD-10-CM | POA: Diagnosis not present

## 2021-05-25 DIAGNOSIS — T783XXA Angioneurotic edema, initial encounter: Secondary | ICD-10-CM | POA: Diagnosis not present

## 2021-06-21 DIAGNOSIS — I2510 Atherosclerotic heart disease of native coronary artery without angina pectoris: Secondary | ICD-10-CM | POA: Diagnosis not present

## 2021-06-21 DIAGNOSIS — E785 Hyperlipidemia, unspecified: Secondary | ICD-10-CM | POA: Diagnosis not present

## 2021-06-21 DIAGNOSIS — I6529 Occlusion and stenosis of unspecified carotid artery: Secondary | ICD-10-CM | POA: Diagnosis not present

## 2021-06-21 DIAGNOSIS — I1310 Hypertensive heart and chronic kidney disease without heart failure, with stage 1 through stage 4 chronic kidney disease, or unspecified chronic kidney disease: Secondary | ICD-10-CM | POA: Diagnosis not present

## 2021-06-21 DIAGNOSIS — N1831 Chronic kidney disease, stage 3a: Secondary | ICD-10-CM | POA: Diagnosis not present

## 2021-06-21 DIAGNOSIS — I700 Atherosclerosis of aorta: Secondary | ICD-10-CM | POA: Diagnosis not present

## 2021-06-21 DIAGNOSIS — M8580 Other specified disorders of bone density and structure, unspecified site: Secondary | ICD-10-CM | POA: Diagnosis not present

## 2021-06-21 DIAGNOSIS — D692 Other nonthrombocytopenic purpura: Secondary | ICD-10-CM | POA: Diagnosis not present

## 2021-06-21 DIAGNOSIS — R252 Cramp and spasm: Secondary | ICD-10-CM | POA: Diagnosis not present

## 2021-08-01 ENCOUNTER — Telehealth: Payer: Self-pay | Admitting: Cardiovascular Disease

## 2021-08-01 DIAGNOSIS — R42 Dizziness and giddiness: Secondary | ICD-10-CM

## 2021-08-01 DIAGNOSIS — R5383 Other fatigue: Secondary | ICD-10-CM

## 2021-08-01 DIAGNOSIS — I2510 Atherosclerotic heart disease of native coronary artery without angina pectoris: Secondary | ICD-10-CM

## 2021-08-01 NOTE — Addendum Note (Signed)
Addended by: Rodman Key on: 08/01/2021 01:32 PM   Modules accepted: Orders

## 2021-08-01 NOTE — Addendum Note (Signed)
Addended by: Rodman Key on: 08/01/2021 01:58 PM   Modules accepted: Orders

## 2021-08-01 NOTE — Addendum Note (Signed)
Addended by: Lauree Chandler D on: 08/01/2021 02:24 PM   Modules accepted: Orders

## 2021-08-01 NOTE — Telephone Encounter (Signed)
Pt called stating that over last 2-3 months she has been experiencing more tiredness post morning 2 mile walk. Pt states she wakes feeling fine, but after her walk she feels excessively tired and it lasts most the day. Denies any changes in respiration or experiencing breathlessness, but does condone experiencing lightheadedness "a couple of times each day that lasts, not long, about 3-4 seconds" and verifies this occurs with exertion. Pt also c/o L shoulder discomfort that arises from more extreme exertion, such as climbing a hill at her house. Pt denies any other symptoms including vision changes, n/v/ jaw pain, arm pain, chest pain. Does not check her BP at home. States she just was concerned due to her family history of heart issues and wanted MD to advise if she should be seen.

## 2021-08-01 NOTE — Telephone Encounter (Signed)
Can we set her up for a stress test? [name] patient and reviewed recommendation for nuclear stress test.  The patient will plan to walk on the treadmill.  Instructions provided.  She is aware she will be contacted to arrange.

## 2021-08-01 NOTE — Addendum Note (Signed)
Addended by: Rodman Key on: 08/01/2021 11:23 AM   Modules accepted: Orders

## 2021-08-01 NOTE — Telephone Encounter (Signed)
Patient states she has been more tired and does not have the pep she normally has. She says the first indication of heart trouble was shoulder related and she is having that again. She says it a tightness like a tooth ache in the shoulder.

## 2021-08-02 ENCOUNTER — Telehealth: Payer: Self-pay

## 2021-08-02 NOTE — Telephone Encounter (Signed)
Spoke with the patient, detailed instructions given. She stated that she understood and would be here for her test. Asked to call back with any questions. S.Tomer Chalmers EMTP 

## 2021-08-07 ENCOUNTER — Other Ambulatory Visit: Payer: Self-pay

## 2021-08-07 ENCOUNTER — Ambulatory Visit (HOSPITAL_COMMUNITY): Payer: Medicare PPO | Attending: Cardiology

## 2021-08-07 DIAGNOSIS — I2510 Atherosclerotic heart disease of native coronary artery without angina pectoris: Secondary | ICD-10-CM | POA: Diagnosis not present

## 2021-08-07 DIAGNOSIS — R42 Dizziness and giddiness: Secondary | ICD-10-CM | POA: Diagnosis not present

## 2021-08-07 DIAGNOSIS — R5383 Other fatigue: Secondary | ICD-10-CM | POA: Diagnosis not present

## 2021-08-07 LAB — MYOCARDIAL PERFUSION IMAGING
Estimated workload: 7
Exercise duration (min): 5 min
Exercise duration (sec): 30 s
LV dias vol: 49 mL (ref 46–106)
LV sys vol: 15 mL
MPHR: 139 {beats}/min
Nuc Stress EF: 70 %
Peak HR: 129 {beats}/min
Percent HR: 92 %
Rest HR: 70 {beats}/min
Rest Nuclear Isotope Dose: 10.6 mCi
SDS: 4
SRS: 1
SSS: 5
ST Depression (mm): 0 mm
Stress Nuclear Isotope Dose: 31.3 mCi
TID: 0.97

## 2021-08-07 MED ORDER — TECHNETIUM TC 99M TETROFOSMIN IV KIT
31.3000 | PACK | Freq: Once | INTRAVENOUS | Status: AC | PRN
  Administered 2021-08-07: 31.3 via INTRAVENOUS
  Filled 2021-08-07: qty 32

## 2021-08-07 MED ORDER — TECHNETIUM TC 99M TETROFOSMIN IV KIT
10.6000 | PACK | Freq: Once | INTRAVENOUS | Status: AC | PRN
Start: 2021-08-07 — End: 2021-08-07
  Administered 2021-08-07: 10.6 via INTRAVENOUS
  Filled 2021-08-07: qty 11

## 2021-08-08 ENCOUNTER — Telehealth: Payer: Self-pay | Admitting: Cardiovascular Disease

## 2021-08-08 NOTE — Telephone Encounter (Signed)
Julie Blanks, MD  08/08/2021  8:45 AM EST     Normal stress test. cdm        Pt called for results, provided pt with results above

## 2021-08-08 NOTE — Telephone Encounter (Signed)
° °  Pt is calling to stress test result

## 2021-08-20 DIAGNOSIS — M720 Palmar fascial fibromatosis [Dupuytren]: Secondary | ICD-10-CM | POA: Diagnosis not present

## 2021-08-20 DIAGNOSIS — M180 Bilateral primary osteoarthritis of first carpometacarpal joints: Secondary | ICD-10-CM | POA: Diagnosis not present

## 2021-08-20 DIAGNOSIS — M65312 Trigger thumb, left thumb: Secondary | ICD-10-CM | POA: Diagnosis not present

## 2021-09-29 DIAGNOSIS — M79671 Pain in right foot: Secondary | ICD-10-CM | POA: Diagnosis not present

## 2021-10-10 DIAGNOSIS — L821 Other seborrheic keratosis: Secondary | ICD-10-CM | POA: Diagnosis not present

## 2021-10-10 DIAGNOSIS — D0462 Carcinoma in situ of skin of left upper limb, including shoulder: Secondary | ICD-10-CM | POA: Diagnosis not present

## 2021-10-10 DIAGNOSIS — D225 Melanocytic nevi of trunk: Secondary | ICD-10-CM | POA: Diagnosis not present

## 2021-10-10 DIAGNOSIS — D485 Neoplasm of uncertain behavior of skin: Secondary | ICD-10-CM | POA: Diagnosis not present

## 2021-10-10 DIAGNOSIS — L570 Actinic keratosis: Secondary | ICD-10-CM | POA: Diagnosis not present

## 2021-10-10 DIAGNOSIS — L718 Other rosacea: Secondary | ICD-10-CM | POA: Diagnosis not present

## 2021-10-10 DIAGNOSIS — D1801 Hemangioma of skin and subcutaneous tissue: Secondary | ICD-10-CM | POA: Diagnosis not present

## 2021-10-10 DIAGNOSIS — L814 Other melanin hyperpigmentation: Secondary | ICD-10-CM | POA: Diagnosis not present

## 2021-10-16 ENCOUNTER — Ambulatory Visit: Payer: Medicare PPO | Admitting: Physician Assistant

## 2021-10-16 ENCOUNTER — Ambulatory Visit (HOSPITAL_COMMUNITY)
Admission: RE | Admit: 2021-10-16 | Discharge: 2021-10-16 | Disposition: A | Discharge status: Home / Self Care | Payer: Medicare PPO | Source: Ambulatory Visit | Attending: Vascular Surgery | Admitting: Vascular Surgery

## 2021-10-16 VITALS — BP 170/86 | HR 70 | Temp 97.9°F | Resp 18 | Ht 63.0 in | Wt 161.5 lb

## 2021-10-16 DIAGNOSIS — I6523 Occlusion and stenosis of bilateral carotid arteries: Secondary | ICD-10-CM

## 2021-10-16 NOTE — Progress Notes (Addendum)
? ? ? ? ?History of Present Illness:  Patient is a 81 y.o. year old female who presents for evaluation of carotid stenosis.  She denies weakness, aphasia or amaurosis.  She has no history of stroke/TIA.  She does have muscle cramps frequently without history of claudication, rest pain or non healing wounds and walks 2 miles a day.   ? ? She is medically managed on Plavix and a daily Statin.   ? ?  ?Past Medical History:  ?Diagnosis Date  ? Allergy   ? Arthritis   ? CAD (coronary artery disease)   ? a. DES to LAD, diagonal 10/2006. b. DES to RCA 05/2007. c. Last cath 11/2012: patent stents, stable dz. d. Carlton Adam cardiolite negative 12/2012.  ? Cataract   ? left eye  ? Diverticulitis   ? Diverticulosis of colon   ? GERD (gastroesophageal reflux disease)   ? Hemorrhoids   ? History of recurrent UTIs   ? Hx of adenomatous colonic polyps   ? tubular  ? Hyperlipidemia   ? Hypertension   ? Peripheral vascular disease ([place])   ? Sleep apnea   ? wears CPAP- 03/07/2020 no longer wears CPAP  ? ? ?Past Surgical History:  ?Procedure Laterality Date  ? ANGIOPLASTY  5/08  and  12/08   ? stent x3  ? Cataract left    ? COLONOSCOPY    ? EYE SURGERY Left   ? laser eye surgery for blood leakage  ? FOOT TENDON SURGERY Left Sept. 20, 2016  ?  Left Calf / foot -   Dr. Sharol Given  ? MASS EXCISION Right 07/13/2019  ? Procedure: EXCISION VOLAR CYST RIGHT WRIST;  Surgeon: Daryll Brod, MD;  Location: [facility];  Service: Orthopedics;  Laterality: Right;  IV REGIONAL FOREARM BLOCK  ? Right shoulder surgery    ? Vitrectomy left eye    ? ? ? ?Social History ?Social History  ? ?Tobacco Use  ? Smoking status: Never  ? Smokeless tobacco: Never  ?Vaping Use  ? Vaping Use: Never used  ?Substance Use Topics  ? Alcohol use: Yes  ?  Alcohol/week: 2.0 - 3.0 standard drinks  ?  Types: 2 - 3 Standard drinks or equivalent per week  ?  Comment: 1 per day  ? Drug use: No  ? ? ?Family History ?Family History  ?Problem Relation Age of Onset  ? Colon  polyps Brother   ? Heart disease Brother   ?     before age 74  ? Hyperlipidemia Brother   ? Prostate cancer Brother   ? Heart disease Father   ?     before age 64  ? Deep vein thrombosis Father   ? Hyperlipidemia Father   ? Heart attack Father 60  ? Stroke Father   ? Heart disease Brother   ? Heart attack Brother   ? Cancer Brother   ?     Merkle cell  ? Stroke Mother   ?     TIA's  ? Dementia Mother   ? Ovarian cancer Maternal Grandmother   ? Colon polyps Paternal Grandfather   ? Liver cancer Paternal Grandfather   ? Heart disease Other   ?     Grandmother   ? Colon cancer Neg Hx   ? Esophageal cancer Neg Hx   ? Stomach cancer Neg Hx   ? Rectal cancer Neg Hx   ? ? ?Allergies ? ?Allergies  ?Allergen Reactions  ? Other Hives, Itching  and Swelling  ?  VICRYL STITCHES ?Tongue swelling ?Other reaction(s): Unknown  ? Fluoride Preparations Other (See Comments)  ?  A test for eye to make pictures, (  causing pressure of Left arm )  ? ? ? ?Current Outpatient Medications  ?Medication Sig Dispense Refill  ? amLODipine (NORVASC) 2.5 MG tablet TAKE 1 TABLET BY MOUTH EVERY DAY 90 tablet 3  ? cholecalciferol (VITAMIN D) 1000 UNITS tablet Take 1,000 Units by mouth daily.    ? clopidogrel (PLAVIX) 75 MG tablet Take 1 tablet (75 mg total) by mouth daily. 90 tablet 3  ? EPINEPHrine 0.3 mg/0.3 mL IJ SOAJ injection See admin instructions.    ? fish oil-omega-3 fatty acids 1000 MG capsule Take 1 g by mouth 2 (two) times daily.    ? fluticasone (FLONASE) 50 MCG/ACT nasal spray Place 1 spray into both nostrils as needed for allergies.   2  ? levocetirizine (XYZAL) 5 MG tablet Take 1 tablet by mouth daily.    ? Multiple Vitamin (MULTIVITAMIN WITH MINERALS) TABS Take 1 tablet by mouth daily.    ? nitroGLYCERIN (NITROSTAT) 0.4 MG SL tablet Place 1 tablet (0.4 mg total) under the tongue every 5 (five) minutes as needed for chest pain. 25 tablet 5  ? Psyllium 30.9 % POWD Take by mouth 1 day or 1 dose.    ? rosuvastatin (CRESTOR) 40 MG tablet  TAKE 1 TABLET BY MOUTH EVERY OTHER DAY 45 tablet 2  ? vitamin B-12 (CYANOCOBALAMIN) 1000 MCG tablet Take 1,000 mcg by mouth daily.    ? vitamin C (ASCORBIC ACID) 500 MG tablet Take 500 mg by mouth daily.    ? ?No current facility-administered medications for this visit.  ? ? ?ROS:  ? ?General:  No weight loss, Fever, chills ? ?HEENT: No recent headaches, no nasal bleeding, no visual changes, no sore throat ? ?Neurologic: No dizziness, blackouts, seizures. No recent symptoms of stroke or mini- stroke. No recent episodes of slurred speech, or temporary blindness. ? ?Cardiac: No recent episodes of chest pain/pressure, no shortness of breath at rest.  No shortness of breath with exertion.  Denies history of atrial fibrillation or irregular heartbeat ? ?Vascular: No history of rest pain in feet.  No history of claudication.  No history of non-healing ulcer, No history of DVT  ? ?Pulmonary: No home oxygen, no productive cough, no hemoptysis,  No asthma or wheezing ? ?Musculoskeletal:  '[ ]'$  Arthritis, '[ ]'$  Low back pain,  '[ ]'$  Joint pain ? ?Hematologic:No history of hypercoagulable state.  No history of easy bleeding.  No history of anemia ? ?Gastrointestinal: No hematochezia or melena,  No gastroesophageal reflux, no trouble swallowing ? ?Urinary: '[ ]'$  chronic Kidney disease, '[ ]'$  on HD - '[ ]'$  MWF or '[ ]'$  TTHS, '[ ]'$  Burning with urination, '[ ]'$  Frequent urination, '[ ]'$  Difficulty urinating;  ? ?Skin: No rashes ? ?Psychological: No history of anxiety,  No history of depression ? ? ?Physical Examination ? ?Vitals:  ? 10/16/21 1007 10/16/21 1010  ?BP: 139/76 (!) 170/86  ?Pulse: 70   ?Resp: 18   ?Temp: 97.9 ?F (36.6 ?C)   ?TempSrc: Temporal   ?SpO2: 96%   ?Weight: 161 lb 8 oz (73.3 kg)   ?Height: '5\' 3"'$  (1.6 m)   ? ? ?Body mass index is 28.61 kg/m?. ? ?General:  Alert and oriented, no acute distress ?HEENT: Normal ?Neck: No bruit or JVD ?Pulmonary: Clear to auscultation bilaterally ?Cardiac: Regular Rate and Rhythm without  murmur ?Gastrointestinal: Soft, non-tender, non-distended, no mass, no scars ?Skin: No rash ?Extremity Pulses:  2+ radial ?Musculoskeletal: No deformity or edema  ?Neurologic: Upper and lower extremity motor 5/5 and symmetric ? ?DATA:  ?Right Carotid Findings:  ?+----------+--------+--------+--------+------------------+--------+  ?          PSV cm/sEDV cm/sStenosisPlaque DescriptionComments  ?+----------+--------+--------+--------+------------------+--------+  ?CCA Prox  87      19                                          ?+----------+--------+--------+--------+------------------+--------+  ?CCA Mid   65      14                                          ?+----------+--------+--------+--------+------------------+--------+  ?CCA Distal57      17              calcific                    ?+----------+--------+--------+--------+------------------+--------+  ?ICA Prox  254     79      60-79%  heterogenous                ?+----------+--------+--------+--------+------------------+--------+  ?ICA Mid   229     54                                          ?+----------+--------+--------+--------+------------------+--------+  ?ICA [id number]     47                                          ?+----------+--------+--------+--------+------------------+--------+  ?ECA       70      12              heterogenous                ?+----------+--------+--------+--------+------------------+--------+  ? ?+----------+--------+-------+----------------+-------------------+  ?          PSV cm/sEDV cmsDescribe        Arm Pressure (mmHG)  ?+----------+--------+-------+----------------+-------------------+  ?[phone]     2      Multiphasic, WNL                     ?+----------+--------+-------+----------------+-------------------+  ? ?+---------+--------+--+--------+--+---------+  ?[place] cm/s62EDV cm/s16Antegrade  ?+---------+--------+--+--------+--+---------+  ? ?    ? ?Left Carotid Findings:  ?+----------+--------+--------+--------+------------------+--------+  ?          PSV cm/sEDV cm/sStenosisPlaque DescriptionComments  ?+----------+--------+--------+--------+------------------+--------+  ?CCA Prox  76      24

## 2021-10-19 DIAGNOSIS — M2021 Hallux rigidus, right foot: Secondary | ICD-10-CM | POA: Diagnosis not present

## 2021-10-22 ENCOUNTER — Other Ambulatory Visit: Payer: Self-pay | Admitting: *Deleted

## 2021-10-22 DIAGNOSIS — I6523 Occlusion and stenosis of bilateral carotid arteries: Secondary | ICD-10-CM

## 2021-10-28 IMAGING — MG MM DIGITAL SCREENING BILAT W/ TOMO AND CAD
8 series · 8 of 24 positions shown · non-contrast
Comparison: Previous exam(s).

CLINICAL DATA: Screening.

EXAM:
DIGITAL SCREENING BILATERAL MAMMOGRAM WITH TOMOSYNTHESIS AND CAD
TECHNIQUE: Bilateral screening digital craniocaudal and mediolateral oblique
mammograms were obtained. Bilateral screening digital breast
tomosynthesis was performed. The images were evaluated with
computer-aided detection.

[L CC synth-2D]
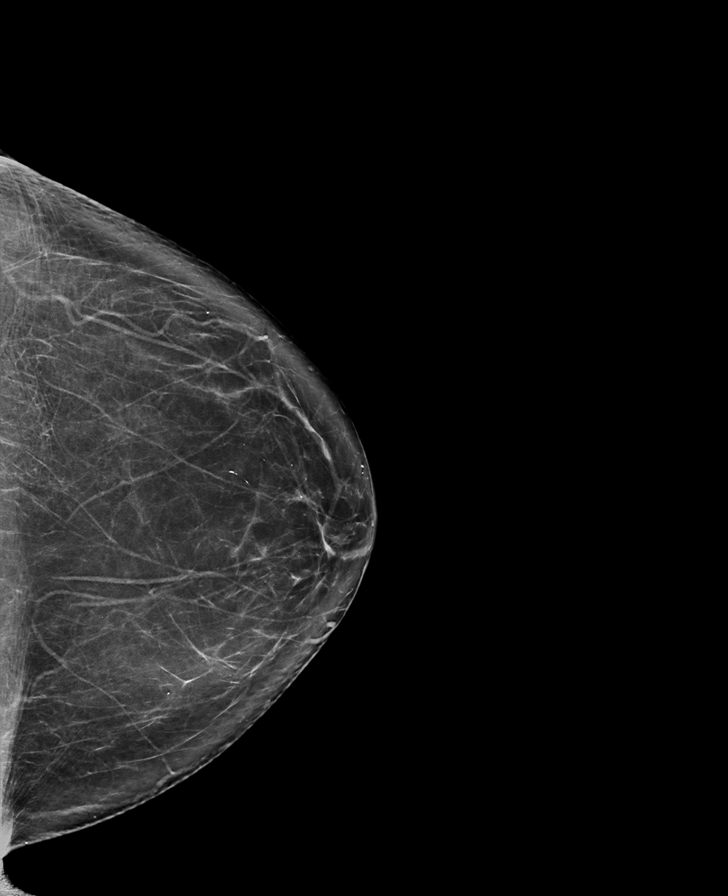

[R CC synth-2D]
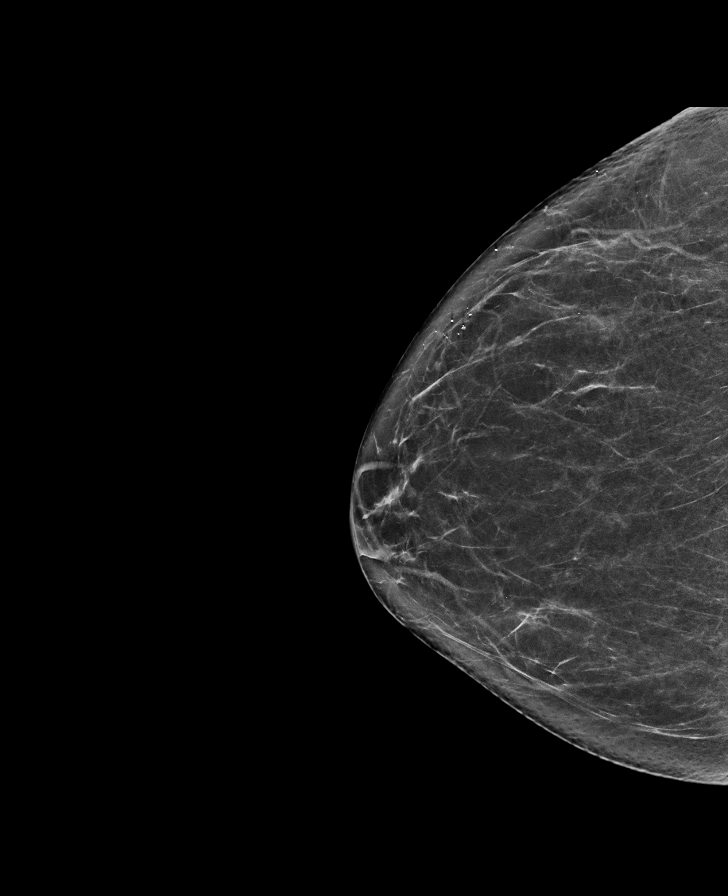

[R MLO synth-2D]
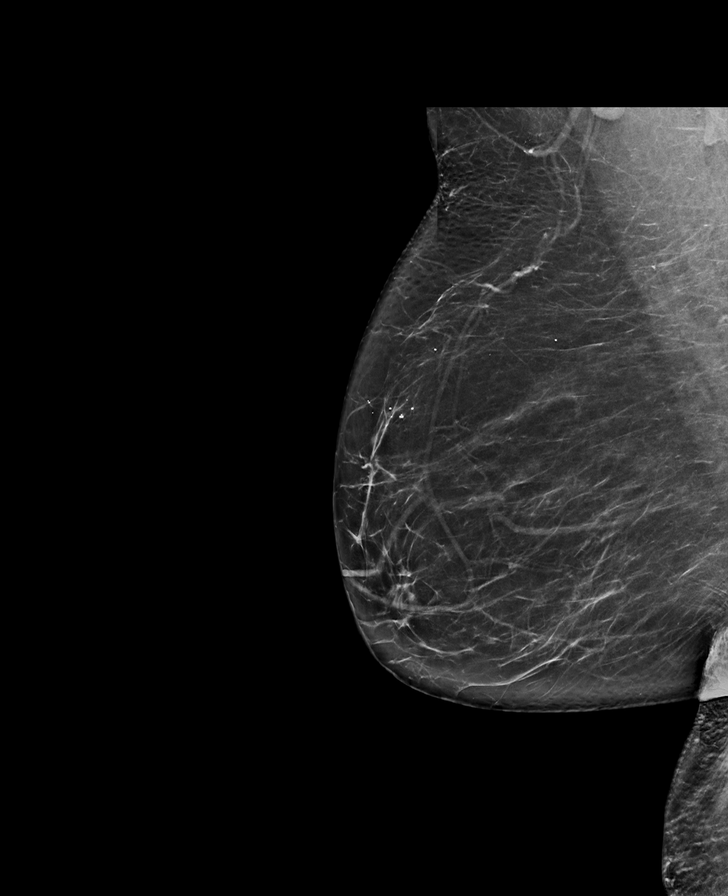

[L MLO synth-2D]
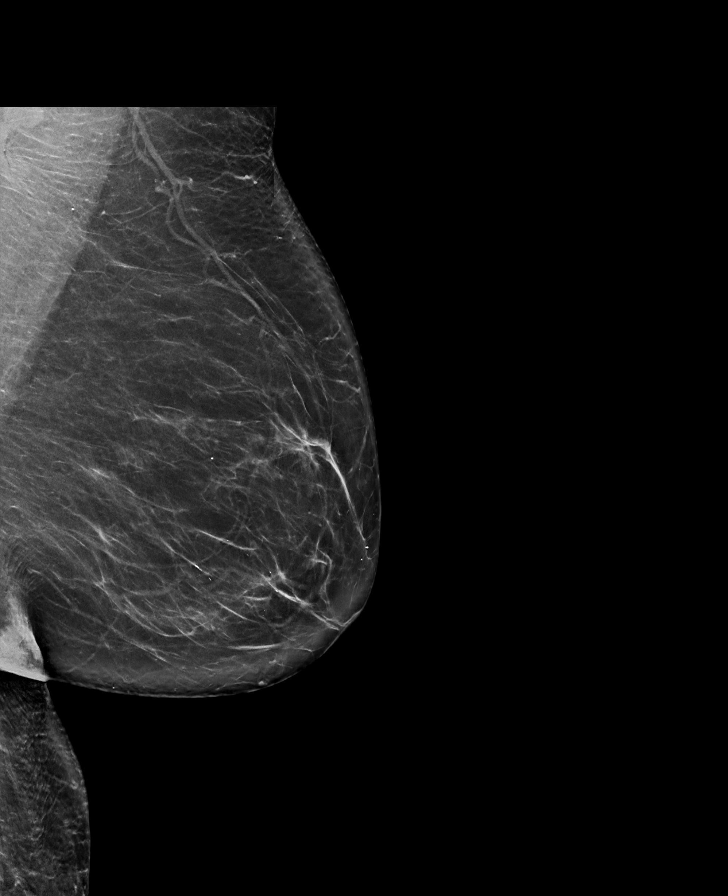

[R CC tomo · tomo slice 37/74.0]
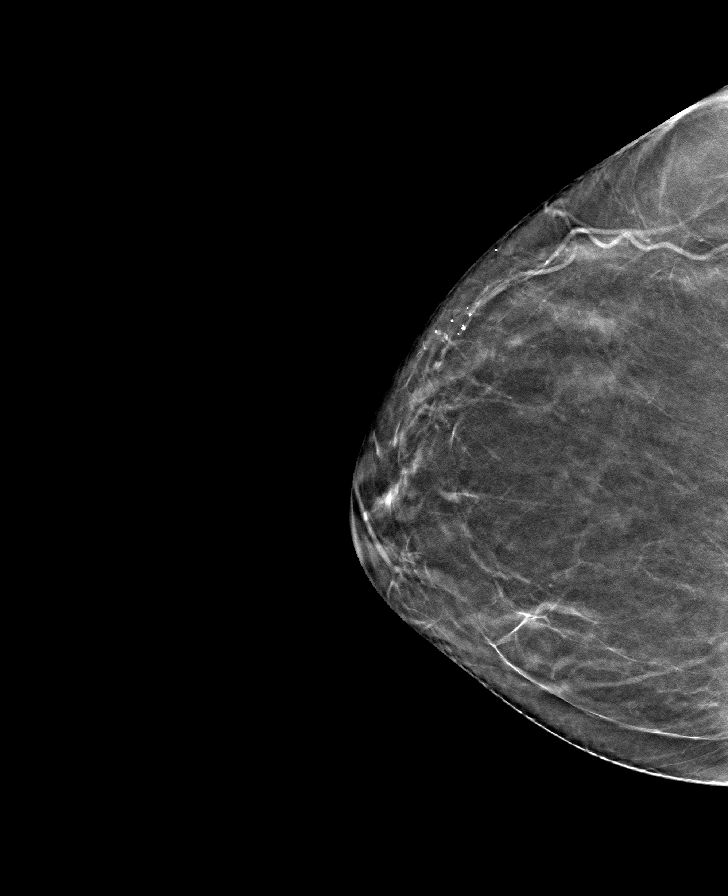

[R MLO tomo · tomo slice 41/81.0]
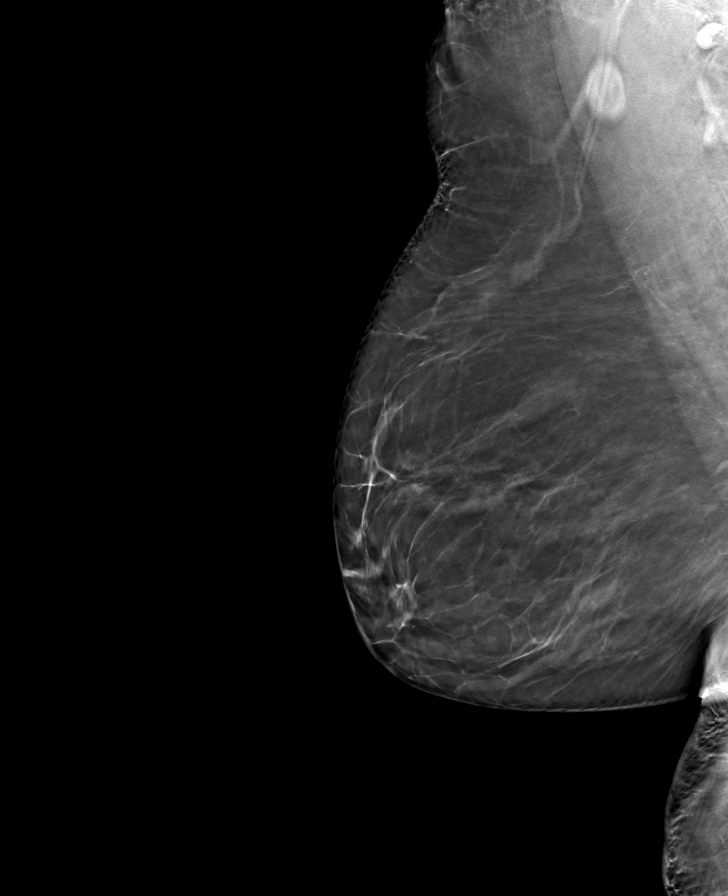

[L CC tomo · tomo slice 41/80.0]
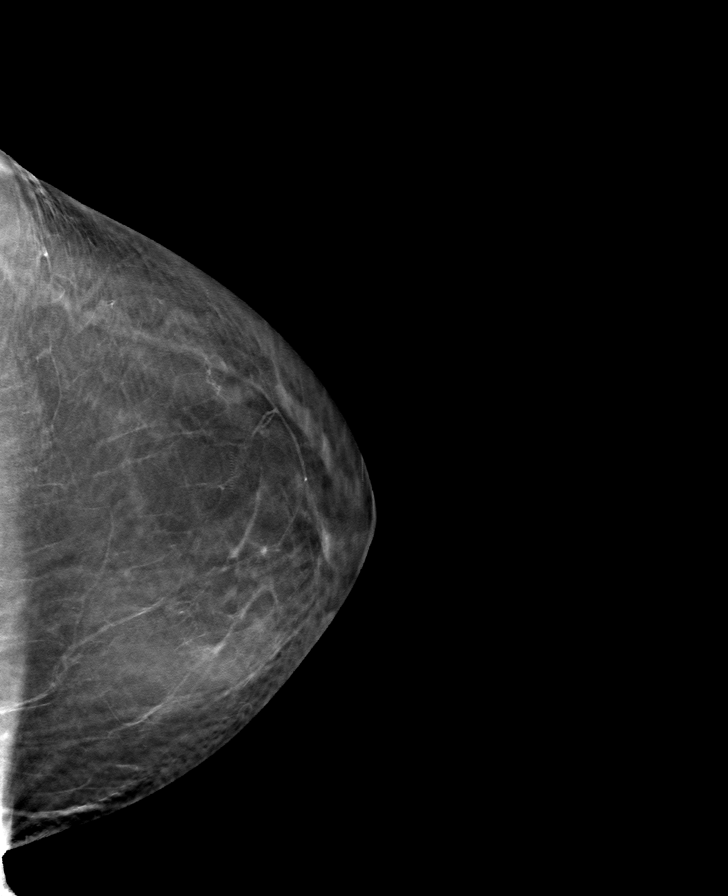

[L MLO tomo · tomo slice 42/83.0]
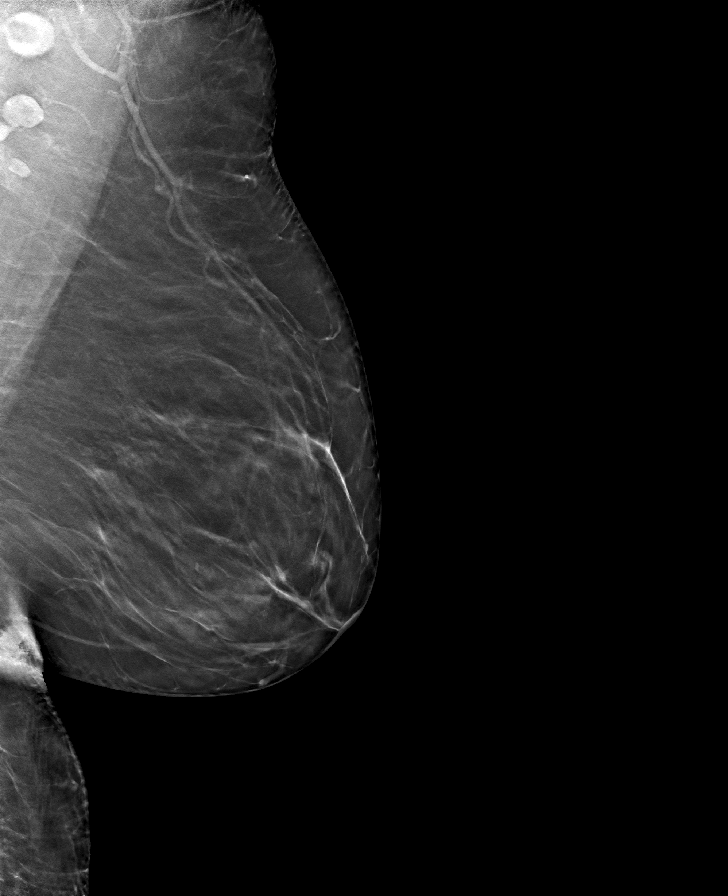

[8 of 24 positions shown; findings below may reference images not displayed]

ACR Breast Density Category b: There are scattered areas of
fibroglandular density.
FINDINGS: There are no findings suspicious for malignancy.
IMPRESSION: No mammographic evidence of malignancy. A result letter of this
screening mammogram will be mailed directly to the patient.

RECOMMENDATION:
Screening mammogram in one year. (Code:51-O-LD2)

BI-RADS CATEGORY  1: Negative.

## 2021-11-01 DIAGNOSIS — D0462 Carcinoma in situ of skin of left upper limb, including shoulder: Secondary | ICD-10-CM | POA: Diagnosis not present

## 2021-12-20 DIAGNOSIS — M859 Disorder of bone density and structure, unspecified: Secondary | ICD-10-CM | POA: Diagnosis not present

## 2021-12-20 DIAGNOSIS — E785 Hyperlipidemia, unspecified: Secondary | ICD-10-CM | POA: Diagnosis not present

## 2021-12-20 DIAGNOSIS — N1831 Chronic kidney disease, stage 3a: Secondary | ICD-10-CM | POA: Diagnosis not present

## 2021-12-20 DIAGNOSIS — I700 Atherosclerosis of aorta: Secondary | ICD-10-CM | POA: Diagnosis not present

## 2021-12-24 DIAGNOSIS — H2511 Age-related nuclear cataract, right eye: Secondary | ICD-10-CM | POA: Diagnosis not present

## 2021-12-24 DIAGNOSIS — H52203 Unspecified astigmatism, bilateral: Secondary | ICD-10-CM | POA: Diagnosis not present

## 2021-12-27 DIAGNOSIS — N1831 Chronic kidney disease, stage 3a: Secondary | ICD-10-CM | POA: Diagnosis not present

## 2021-12-27 DIAGNOSIS — I1310 Hypertensive heart and chronic kidney disease without heart failure, with stage 1 through stage 4 chronic kidney disease, or unspecified chronic kidney disease: Secondary | ICD-10-CM | POA: Diagnosis not present

## 2021-12-27 DIAGNOSIS — R252 Cramp and spasm: Secondary | ICD-10-CM | POA: Diagnosis not present

## 2021-12-27 DIAGNOSIS — D692 Other nonthrombocytopenic purpura: Secondary | ICD-10-CM | POA: Diagnosis not present

## 2021-12-27 DIAGNOSIS — I6529 Occlusion and stenosis of unspecified carotid artery: Secondary | ICD-10-CM | POA: Diagnosis not present

## 2021-12-27 DIAGNOSIS — I2510 Atherosclerotic heart disease of native coronary artery without angina pectoris: Secondary | ICD-10-CM | POA: Diagnosis not present

## 2021-12-27 DIAGNOSIS — M2550 Pain in unspecified joint: Secondary | ICD-10-CM | POA: Diagnosis not present

## 2021-12-27 DIAGNOSIS — Z0000 Encounter for general adult medical examination without abnormal findings: Secondary | ICD-10-CM | POA: Diagnosis not present

## 2021-12-27 DIAGNOSIS — R82998 Other abnormal findings in urine: Secondary | ICD-10-CM | POA: Diagnosis not present

## 2021-12-27 DIAGNOSIS — E785 Hyperlipidemia, unspecified: Secondary | ICD-10-CM | POA: Diagnosis not present

## 2022-01-15 DIAGNOSIS — M1990 Unspecified osteoarthritis, unspecified site: Secondary | ICD-10-CM | POA: Diagnosis not present

## 2022-01-15 DIAGNOSIS — Z7902 Long term (current) use of antithrombotics/antiplatelets: Secondary | ICD-10-CM | POA: Diagnosis not present

## 2022-01-15 DIAGNOSIS — I2510 Atherosclerotic heart disease of native coronary artery without angina pectoris: Secondary | ICD-10-CM | POA: Diagnosis not present

## 2022-01-15 DIAGNOSIS — Z809 Family history of malignant neoplasm, unspecified: Secondary | ICD-10-CM | POA: Diagnosis not present

## 2022-01-15 DIAGNOSIS — J309 Allergic rhinitis, unspecified: Secondary | ICD-10-CM | POA: Diagnosis not present

## 2022-01-15 DIAGNOSIS — K219 Gastro-esophageal reflux disease without esophagitis: Secondary | ICD-10-CM | POA: Diagnosis not present

## 2022-01-15 DIAGNOSIS — R32 Unspecified urinary incontinence: Secondary | ICD-10-CM | POA: Diagnosis not present

## 2022-01-15 DIAGNOSIS — I10 Essential (primary) hypertension: Secondary | ICD-10-CM | POA: Diagnosis not present

## 2022-01-15 DIAGNOSIS — E785 Hyperlipidemia, unspecified: Secondary | ICD-10-CM | POA: Diagnosis not present

## 2022-01-29 ENCOUNTER — Other Ambulatory Visit: Payer: Self-pay | Admitting: Internal Medicine

## 2022-01-29 DIAGNOSIS — Z1231 Encounter for screening mammogram for malignant neoplasm of breast: Secondary | ICD-10-CM

## 2022-02-18 NOTE — Progress Notes (Signed)
Chief Complaint  Patient presents with   Follow-up    CAD    History of Present Illness: 81 yo female with history of CAD with previous stenting of the mid LAD, Diagonal and Proximal and mid RCA, HTN, HLD, OSA, carotid artery disease here today for follow up. Her cardiac history includes drug eluting stents placed in the LAD and Diagonal in May 2008, followed by drug eluting stent placement in the RCA in December 2008. At her initial visit in our office November 2012, she had c/o left arm pain and her prior anginal equivalent was left arm pain. Stress test in November 2012 with no ischemia. I saw her June 2013 and she c/o recurrence of pain in her left arm with exertion associated with fatigue and SOB. Cardiac cath June 2013 with stable disease, patent stents LAD, Diagonal, RCA. Admitted July 2014 with left shoulder pain. Stress myoview 12/28/12 with no ischemia. She had some left sided weakness in March 2015 and was seen in [facility] and was found to have moderate carotid artery disease. She has been followed in the [facility] office for her carotid disease. Echo November 2020 with LVEF=60-65%. No significant valve disease.  Nuclear stress test April 2021 with no ischemia.   She is here today for follow up. The patient denies any chest pain, dyspnea, palpitations, lower extremity edema, orthopnea, PND, dizziness, near syncope or syncope.      Primary Care Physician: Shon Baton, MD  Past Medical History:  Diagnosis Date   Allergy    Arthritis    CAD (coronary artery disease)    a. DES to LAD, diagonal 10/2006. b. DES to RCA 05/2007. c. Last cath 11/2012: patent stents, stable dz. d. Carl[facility] Adam cardiolite negative 12/2012.   Cataract    left eye   Diverticulitis    Diverticulosis of colon    GERD (gastroesophageal reflux disease)    Hemorrhoids    History of recurrent UTIs    Hx of adenomatous colonic polyps    tubular   Hyperlipidemia    Hypertension    Peripheral vascular disease ([place])    Sleep  apnea    wears CPAP- 03/07/2020 no longer wears CPAP    Past Surgical History:  Procedure Laterality Date   ANGIOPLASTY  5/08  and  12/08    stent x3   Cataract left     COLONOSCOPY     EYE SURGERY Left    laser eye surgery for blood leakage   FOOT TENDON SURGERY Left Sept. 20, 2016    Left Calf / foot -   Dr. Sharol Given   MASS EXCISION Right 07/13/2019   Procedure: EXCISION VOLAR CYST RIGHT WRIST;  Surgeon: Daryll Brod, MD;  Location: [facility];  Service: Orthopedics;  Laterality: Right;  IV REGIONAL FOREARM BLOCK   Right shoulder surgery     Vitrectomy left eye      Current Outpatient Medications  Medication Sig Dispense Refill   amLODipine (NORVASC) 2.5 MG tablet TAKE 1 TABLET BY MOUTH EVERY DAY 90 tablet 3   cholecalciferol (VITAMIN D) 1000 UNITS tablet Take 1,000 Units by mouth daily.     clopidogrel (PLAVIX) 75 MG tablet Take 1 tablet (75 mg total) by mouth daily. 90 tablet 3   EPINEPHrine 0.3 mg/0.3 mL IJ SOAJ injection See admin instructions.     fish oil-omega-3 fatty acids 1000 MG capsule Take 1 g by mouth 2 (two) times daily.     fluticasone (FLONASE) 50 MCG/ACT nasal spray  Place 1 spray into both nostrils as needed for allergies.   2   levocetirizine (XYZAL) 5 MG tablet Take 1 tablet by mouth daily.     Multiple Vitamin (MULTIVITAMIN WITH MINERALS) TABS Take 1 tablet by mouth daily.     nitroGLYCERIN (NITROSTAT) 0.4 MG SL tablet Place 1 tablet (0.4 mg total) under the tongue every 5 (five) minutes as needed for chest pain. 25 tablet 5   Psyllium 30.9 % POWD Take by mouth 2 (two) times daily.     rosuvastatin (CRESTOR) 40 MG tablet TAKE 1 TABLET BY MOUTH EVERY OTHER DAY 45 tablet 2   vitamin B-12 (CYANOCOBALAMIN) 1000 MCG tablet Take 1,000 mcg by mouth daily.     vitamin C (ASCORBIC ACID) 500 MG tablet Take 500 mg by mouth daily.     No current facility-administered medications for this visit.    Allergies  Allergen Reactions   Other Hives, Itching and  Swelling    VICRYL STITCHES Tongue swelling Other reaction(s): Unknown   Fluoride Preparations Other (See Comments)    A test for eye to make pictures, (  causing pressure of Left arm )    Social History   Socioeconomic History   Marital status: Married    Spouse name: Not on file   Number of children: 0   Years of education: Not on file   Highest education level: Not on file  Occupational History   Occupation: retired     Fish farm manager: RETIRED  Tobacco Use   Smoking status: Never   Smokeless tobacco: Never  Vaping Use   Vaping Use: Never used  Substance and Sexual Activity   Alcohol use: Yes    Alcohol/week: 2.0 - 3.0 standard drinks of alcohol    Types: 2 - 3 Standard drinks or equivalent per week    Comment: 1 per day   Drug use: No   Sexual activity: Not on file  Other Topics Concern   Not on file  Social History Narrative   Not on file   Social Determinants of Health   Financial Resource Strain: Not on file  Food Insecurity: Not on file  Transportation Needs: Not on file  Physical Activity: Not on file  Stress: Not on file  Social Connections: Not on file  Intimate Partner Violence: Not on file    Family History  Problem Relation Age of Onset   Colon polyps Brother    Heart disease Brother        before age 72   Hyperlipidemia Brother    Prostate cancer Brother    Heart disease Father        before age 27   Deep vein thrombosis Father    Hyperlipidemia Father    Heart attack Father 66   Stroke Father    Heart disease Brother    Heart attack Brother    Cancer Brother        Merkle cell   Stroke Mother        TIA's   Dementia Mother    Ovarian cancer Maternal Grandmother    Colon polyps Paternal Grandfather    Liver cancer Paternal Grandfather    Heart disease Other        Grandmother    Colon cancer Neg Hx    Esophageal cancer Neg Hx    Stomach cancer Neg Hx    Rectal cancer Neg Hx     Review of Systems:  As stated in the HPI and otherwise  negative.  BP 118/80   Pulse 69   Ht '5\' 3"'$  (1.6 m)   Wt 159 lb 9.6 oz (72.4 kg)   SpO2 98%   BMI 28.27 kg/m   Physical Examination:  General: Well developed, well nourished, NAD  HEENT: OP clear, mucus membranes moist  SKIN: warm, dry. No rashes. Neuro: No focal deficits  Musculoskeletal: Muscle strength 5/5 all ext  Psychiatric: Mood and affect normal  Neck: No JVD, no carotid bruits, no thyromegaly, no lymphadenopathy.  Lungs:Clear bilaterally, no wheezes, rhonci, crackles Cardiovascular: Regular rate and rhythm. Soft systolic murmur.  Abdomen:Soft. Bowel sounds present. Non-tender.  Extremities: No lower extremity edema. Pulses are 2 + in the bilateral DP/PT.  Cardiac cath 11/27/11:  Left main:  Left Anterior Descending Artery: Large caliber vessel that courses to the apex. There is a patent stent in the proximal to mid LAD with minimal, 20% in stent restenosis. The diagonal branch arises just before the LAD stent. There is a patent stent in the ostium and proximal segment of the diagonal branch with perhaps 20% ostial stenosis.  Circumflex Artery: Moderate sized vessel with no disease noted. Small early OM branch with no disease.  Right Coronary Artery: Large, dominant vessel with patent stent in the proximal vessel. Just before the stent, there is a 20% plaque. Just beyond the stent, there is a 30% plaque.  Left Ventricular Angiogram: LVEF 65-70%.   EKG:  EKG is ordered today. The ekg ordered today demonstrates sinus  Recent Labs: No results found for requested labs within last 365 days.   Lipid Panel Followed in primary care   Wt Readings from Last 3 Encounters:  02/19/22 159 lb 9.6 oz (72.4 kg)  10/16/21 161 lb 8 oz (73.3 kg)  08/07/21 160 lb (72.6 kg)    Assessment and Plan:   1. CAD without angina: She has had stents placed in the LAD, Diagonal and RCA. Last cath in June 2013 with stable CAD.  Nuclear stress test April 2021 with no ischemia. No chest pain. Will  continue Plavix and statin.    2. HTN: BP is well controlled. No changes  3. HYPERLIPIDEMIA: Lipids followed in primary care. LDL 78 in July 2023. Continue statin but would increase Crestor to 40 mg 4 days per week.   4. Carotid artery disease: Moderate RICA stenosis by dopplers in May 2023. Followed in [facility]  5. Cardiac murmur: Will arrange an echo to assess.   Current medicines are reviewed at length with the patient today.  The patient does not have concerns regarding medicines.  The following changes have been made:  no change  Labs/ tests ordered today include:   Orders Placed This Encounter  Procedures   EKG 12-Lead   ECHOCARDIOGRAM COMPLETE   Disposition:   F/U with me in 12  months  Signed, Lauree Chandler, MD 02/19/2022 9:36 AM    [facility] [address], [place], [place]  [address] Phone: [phone]; Fax: [phone]

## 2022-02-19 ENCOUNTER — Ambulatory Visit: Payer: Medicare PPO | Attending: Cardiovascular Disease | Admitting: Cardiovascular Disease

## 2022-02-19 ENCOUNTER — Encounter: Payer: Self-pay | Admitting: Cardiovascular Disease

## 2022-02-19 VITALS — BP 118/80 | HR 69 | Ht 63.0 in | Wt 159.6 lb

## 2022-02-19 DIAGNOSIS — E7800 Pure hypercholesterolemia, unspecified: Secondary | ICD-10-CM | POA: Diagnosis not present

## 2022-02-19 DIAGNOSIS — I6523 Occlusion and stenosis of bilateral carotid arteries: Secondary | ICD-10-CM

## 2022-02-19 DIAGNOSIS — I2510 Atherosclerotic heart disease of native coronary artery without angina pectoris: Secondary | ICD-10-CM | POA: Diagnosis not present

## 2022-02-19 DIAGNOSIS — I10 Essential (primary) hypertension: Secondary | ICD-10-CM

## 2022-02-19 DIAGNOSIS — R011 Cardiac murmur, unspecified: Secondary | ICD-10-CM | POA: Diagnosis not present

## 2022-02-19 NOTE — Patient Instructions (Signed)
Medication Instructions:  Your physician recommends that you continue on your current medications as directed. Please refer to the Current Medication list given to you today.  *If you need a refill on your cardiac medications before your next appointment, please call your pharmacy*   Lab Work: none If you have labs (blood work) drawn today and your tests are completely normal, you will receive your results only by: [facility] (if you have MyChart) OR A paper copy in the mail If you have any lab test that is abnormal or we need to change your treatment, we will call you to review the results.   Testing/Procedures: Your physician has requested that you have an echocardiogram. Echocardiography is a painless test that uses sound waves to create images of your heart. It provides your doctor with information about the size and shape of your heart and how well your heart's chambers and valves are working. This procedure takes approximately one hour. There are no restrictions for this procedure.    Follow-Up: At [facility], you and your health needs are our priority.  As part of our continuing mission to provide you with exceptional heart care, we have created designated Provider Care Teams.  These Care Teams include your primary Cardiologist (physician) and Advanced Practice Providers (APPs -  Physician Assistants and Nurse Practitioners) who all work together to provide you with the care you need, when you need it.  We recommend signing up for the patient portal called "MyChart".  Sign up information is provided on this After Visit Summary.  MyChart is used to connect with patients for Virtual Visits (Telemedicine).  Patients are able to view lab/test results, encounter notes, upcoming appointments, etc.  Non-urgent messages can be sent to your provider as well.   To learn more about what you can do with MyChart, go to [email or web address].    Your next appointment:   12  month(s)  The format for your next appointment:   In Person  Provider:   Lauree Chandler, MD     Other Instructions    Important Information About Sugar

## 2022-03-01 ENCOUNTER — Ambulatory Visit: Payer: Medicare PPO

## 2022-03-01 ENCOUNTER — Other Ambulatory Visit: Payer: Self-pay | Admitting: Cardiovascular Disease

## 2022-03-04 ENCOUNTER — Ambulatory Visit (HOSPITAL_COMMUNITY): Payer: Medicare PPO | Attending: Cardiovascular Disease

## 2022-03-04 DIAGNOSIS — R011 Cardiac murmur, unspecified: Secondary | ICD-10-CM

## 2022-03-04 LAB — ECHOCARDIOGRAM COMPLETE
Area-P 1/2: 3.27 cm2
S' Lateral: 2.4 cm

## 2022-03-12 ENCOUNTER — Ambulatory Visit: Payer: Medicare PPO

## 2022-03-14 ENCOUNTER — Ambulatory Visit
Admission: RE | Admit: 2022-03-14 | Discharge: 2022-03-14 | Disposition: A | Discharge status: Home / Self Care | Payer: Medicare PPO | Source: Ambulatory Visit | Attending: Internal Medicine | Admitting: Internal Medicine

## 2022-03-14 DIAGNOSIS — Z1231 Encounter for screening mammogram for malignant neoplasm of breast: Secondary | ICD-10-CM

## 2022-03-15 ENCOUNTER — Telehealth: Payer: Self-pay | Admitting: Cardiovascular Disease

## 2022-03-15 DIAGNOSIS — M7061 Trochanteric bursitis, right hip: Secondary | ICD-10-CM | POA: Diagnosis not present

## 2022-03-15 DIAGNOSIS — M25512 Pain in left shoulder: Secondary | ICD-10-CM | POA: Diagnosis not present

## 2022-03-15 DIAGNOSIS — M7502 Adhesive capsulitis of left shoulder: Secondary | ICD-10-CM | POA: Diagnosis not present

## 2022-03-15 NOTE — Telephone Encounter (Signed)
Pt c/o medication issue:  1. Name of Medication: rosuvastatin (CRESTOR) 40 MG tablet  2. How are you currently taking this medication (dosage and times per day)? 1 tablet every other day  3. Are you having a reaction (difficulty breathing--STAT)? no  4. What is your medication issue? Patient calling to see if she can stop the crestor, because it is causing inflammation and pain in her joints.

## 2022-03-15 NOTE — Telephone Encounter (Signed)
Pt is having a lot of pain and inflammation in her joints and mucles.  She has been prompted by her pharmacist and is seeing orthopedics today.  She is hurting, moreso since increasing rosuvastatin 40 mg to 4 times per week from 3.  In the past she has had inflammation in toe, thumb and hip. All treated with steriods. She wonders if these were related to statin also.   I asked her to stop the statin for 2 weeks and let us know if she is better.  If no better, asked her to resume at current dose.  She will call with an update.  Pt aware I will forward to Dr. Angelena Form and call her back if he has other recommendations.

## 2022-03-28 ENCOUNTER — Telehealth: Payer: Self-pay | Admitting: Cardiovascular Disease

## 2022-03-28 DIAGNOSIS — E7800 Pure hypercholesterolemia, unspecified: Secondary | ICD-10-CM

## 2022-03-28 NOTE — Telephone Encounter (Signed)
Pt c/o medication issue:  1. Name of Medication: rosuvastatin (CRESTOR) 40 MG tablet  2. How are you currently taking this medication (dosage and times per day)? Not currently taking  3. Are you having a reaction (difficulty breathing--STAT)? no  4. What is your medication issue? Patient states she has been off the crestor for 2 weeks and needs to know the plan for the next cholesterol medication.

## 2022-03-28 NOTE — Telephone Encounter (Signed)
Patient feels doing better of Crestor.  No leg aches in the mornings now off Crestor.  Feels body has gotten progressively better.  Will route to Dr. Angelena Form for further recommendations.  Her brother is on Leqvio and she wonders if she is a candidate for that.  Adv we may refer to [facility] and if so she will be called to schedule.

## 2022-04-02 DIAGNOSIS — M25512 Pain in left shoulder: Secondary | ICD-10-CM | POA: Diagnosis not present

## 2022-04-02 DIAGNOSIS — M7502 Adhesive capsulitis of left shoulder: Secondary | ICD-10-CM | POA: Diagnosis not present

## 2022-04-25 DIAGNOSIS — M25512 Pain in left shoulder: Secondary | ICD-10-CM | POA: Diagnosis not present

## 2022-04-25 DIAGNOSIS — M7061 Trochanteric bursitis, right hip: Secondary | ICD-10-CM | POA: Diagnosis not present

## 2022-04-25 DIAGNOSIS — M7502 Adhesive capsulitis of left shoulder: Secondary | ICD-10-CM | POA: Diagnosis not present

## 2022-05-13 ENCOUNTER — Ambulatory Visit: Payer: Medicare PPO | Attending: Cardiovascular Disease | Admitting: Pharmacist

## 2022-05-13 DIAGNOSIS — E782 Mixed hyperlipidemia: Secondary | ICD-10-CM | POA: Diagnosis not present

## 2022-05-13 DIAGNOSIS — M25512 Pain in left shoulder: Secondary | ICD-10-CM | POA: Diagnosis not present

## 2022-05-13 DIAGNOSIS — M7502 Adhesive capsulitis of left shoulder: Secondary | ICD-10-CM | POA: Diagnosis not present

## 2022-05-13 MED ORDER — VASCEPA 1 G PO CAPS: 2.0000 g | ORAL_CAPSULE | Freq: Two times a day (BID) | ORAL | 3 refills | Status: DC

## 2022-05-13 MED ORDER — ROSUVASTATIN CALCIUM 10 MG PO TABS: 10.0000 mg | ORAL_TABLET | Freq: Every day | ORAL | 5 refills | Status: DC

## 2022-05-13 NOTE — Progress Notes (Signed)
Patient ID: [name]                 DOB: 12-16-1940                    MRN: [id number]     HPI: [name] is a 81 y.o. female patient referred to lipid clinic by Dr [name]. PMH is significant for CAD s/p stenting to mid LAD and diag in 10/2006, DES to RCA in 05/2007, PVD, HTN, HLD, and OSA. At last visit with Dr [name] in September 2023, Crestor '40mg'$  was increased from 3x a week to 4x a week. 3 weeks later, pt contacted clinic reporting pain in her joints and inflammation. She was advised to stop statin for 2 weeks and reported improvement in symptoms.   Pt presents today for follow up. Started on Crestor '40mg'$  daily after her stents were placed in 2008. 3-4 years ago, her PCP cut back on frequency to 3x per week due to muscle aches. Noticed a bit of an improvement but not full resolution of symptoms. Frequency was increased to 4x/week this past September which caused an increase in her symptoms - primarily body aches and muscle pain in her arms and legs as well as malaise. Started on cyclobenzaprine as needed for cramps, not needing as frequently since she's been off her statin. Takes OTC fish oil 2g BID and CoQ10 as well. Has not taken other prescription cholesterol medications aside from Crestor.  Current Medications: OTC fish oil 2g BID Intolerances: rosuvastatin 20-'40mg'$  daily - leg pain and cramping Risk Factors: CAD, PAD, HTN, age, FHx LDL goal: '55mg'$ /dL  Diet: 1 egg for breakfast 5 days a week, oatmeal the other days. Little meat, lots of fruit and green vegetables. Likes sweets every once in a while. Avoids fried food  Exercise: walks 2-3 miles a day  Family History:   Colon polyps Brother     Heart disease Brother          before age 103   Hyperlipidemia Brother     Prostate cancer Brother     Heart disease Father          before age 71   Deep vein thrombosis Father     Hyperlipidemia Father     Heart attack Father 35   Stroke Father     Heart disease Brother      Heart attack Brother     Cancer Brother          Merkle cell   Stroke Mother          TIA's   Dementia Mother     Ovarian cancer Maternal Grandmother     Colon polyps Paternal Grandfather     Liver cancer Paternal Grandfather     Heart disease Other          Grandmother     Social History: occasional alcohol, denies drug and tobacco use  Labs: 12/20/21: TC 157, TG 137, HDL 52, LDL 78, nonHDL 105 (rosuvastatin '40mg'$  3x/week)  Past Medical History:  Diagnosis Date   Allergy    Arthritis    CAD (coronary artery disease)    a. DES to LAD, diagonal 10/2006. b. DES to RCA 05/2007. c. Last cath 11/2012: patent stents, stable dz. d. [name] cardiolite negative 12/2012.   Cataract    left eye   Diverticulitis    Diverticulosis of colon    GERD (gastroesophageal reflux disease)    Hemorrhoids  History of recurrent UTIs    Hx of adenomatous colonic polyps    tubular   Hyperlipidemia    Hypertension    Peripheral vascular disease ([place])    Sleep apnea    wears CPAP- 03/07/2020 no longer wears CPAP    Current Outpatient Medications on File Prior to Visit  Medication Sig Dispense Refill   amLODipine (NORVASC) 2.5 MG tablet TAKE 1 TABLET BY MOUTH EVERY DAY 90 tablet 3   cholecalciferol (VITAMIN D) 1000 UNITS tablet Take 1,000 Units by mouth daily.     clopidogrel (PLAVIX) 75 MG tablet TAKE 1 TABLET BY MOUTH EVERY DAY 90 tablet 3   EPINEPHrine 0.3 mg/0.3 mL IJ SOAJ injection See admin instructions.     fish oil-omega-3 fatty acids 1000 MG capsule Take 1 g by mouth 2 (two) times daily.     fluticasone (FLONASE) 50 MCG/ACT nasal spray Place 1 spray into both nostrils as needed for allergies.   2   levocetirizine (XYZAL) 5 MG tablet Take 1 tablet by mouth daily.     Multiple Vitamin (MULTIVITAMIN WITH MINERALS) TABS Take 1 tablet by mouth daily.     nitroGLYCERIN (NITROSTAT) 0.4 MG SL tablet Place 1 tablet (0.4 mg total) under the tongue every 5 (five) minutes as needed for chest pain.  25 tablet 5   Psyllium 30.9 % POWD Take by mouth 2 (two) times daily.     vitamin B-12 (CYANOCOBALAMIN) 1000 MCG tablet Take 1,000 mcg by mouth daily.     vitamin C (ASCORBIC ACID) 500 MG tablet Take 500 mg by mouth daily.     No current facility-administered medications on file prior to visit.    Allergies  Allergen Reactions   Other Hives, Itching and Swelling    VICRYL STITCHES Tongue swelling Other reaction(s): Unknown   Fluoride Preparations Other (See Comments)    A test for eye to make pictures, (  causing pressure of Left arm )   Rosuvastatin Other (See Comments)    Leg pains, cramping    Assessment/Plan:  1. Hyperlipidemia - LDL above goal < 55 given hx of PAD and CAD. Intolerant to Crestor '40mg'$  3-4x per week and daily. Will retry Crestor at lower dose of '10mg'$  daily. Pt aware to decrease to '5mg'$  daily if needed for tolerability. Will also change OTC fish oil to Vascepa 2g BID for cardiac benefit, comparable cost as OTC as well. Rechecking baseline lipids today per pt request as she's been off of her statin for at least 6 weeks or so. Not fasting today so will add on direct LDL (has had 1 egg, piece of toast, and coffee with a bit of creamer). Will have her PCP recheck at her next appt in January. Discussed addition of Zetia, Nexlizet, Repatha, or Leqvio as needed pending on statin tolerability and f/u lab results in January. Nexlizet, Repatha, and Vascepa are all Tier 2 medications on her insurance ($40/1 month or $80/3 month supply).  [name] E. Kanya Potteiger, PharmD, BCACP, [facility] [address] [place], [place] [address] Phone: [phone]; Fax: [phone] 05/13/2022 10:15 AM

## 2022-05-13 NOTE — Patient Instructions (Addendum)
Your LDL cholesterol was 78 on rosuvastatin '40mg'$  3x per week  Your LDL goal is < 55   Change your over the counter fish oil to Vascepa (prescription fish oil) 2 capsules twice daily with food. This lowers your triglycerides (which have been normal) but also helps to lower your cardiovascular risk (lower risk of heart attacks and strokes)  Retry a lower dose of rosuvastatin (Crestor) '10mg'$  - 1 tablet once daily. If you notice any aches, you can decrease your dose to '5mg'$  daily  Call Jinny Blossom, PharmD with any tolerability issues [phone]  Other add on options include: -Zetia - daily pill that lowers LDL cholesterol by 20% -Nexlizet - daily pill that lowers LDL cholesterol by 40-50% ($80/3 month supply) -Repatha - twice monthly injection given at home that lowers LDL cholesterol by 60% ($80/3 month supply) -Leqvio - twice yearly injection given at [facility] that lowers LDL cholesterol by 50% (would need to check on cost with insurance)  Have your primary care doctor recheck your cholesterol

## 2022-05-14 LAB — LIPID PANEL
Chol/HDL Ratio: 4.1 ratio (ref 0.0–4.4)
Cholesterol, Total: 223 mg/dL — ABNORMAL HIGH (ref 100–199)
HDL: 54 mg/dL (ref >39)
LDL Chol Calc (NIH): 131 mg/dL — ABNORMAL HIGH (ref 0–99)
Triglycerides: 214 mg/dL — ABNORMAL HIGH (ref 0–149)
VLDL Cholesterol Cal: 38 mg/dL (ref 5–40)

## 2022-05-14 LAB — LDL CHOLESTEROL, DIRECT: LDL Direct: 138 mg/dL — ABNORMAL HIGH (ref 0–99)

## 2022-06-06 DIAGNOSIS — M7502 Adhesive capsulitis of left shoulder: Secondary | ICD-10-CM | POA: Diagnosis not present

## 2022-07-05 ENCOUNTER — Telehealth: Payer: Self-pay | Admitting: Cardiovascular Disease

## 2022-07-05 DIAGNOSIS — M791 Myalgia, unspecified site: Secondary | ICD-10-CM | POA: Diagnosis not present

## 2022-07-05 DIAGNOSIS — I700 Atherosclerosis of aorta: Secondary | ICD-10-CM | POA: Diagnosis not present

## 2022-07-05 DIAGNOSIS — I2510 Atherosclerotic heart disease of native coronary artery without angina pectoris: Secondary | ICD-10-CM | POA: Diagnosis not present

## 2022-07-05 DIAGNOSIS — I6529 Occlusion and stenosis of unspecified carotid artery: Secondary | ICD-10-CM | POA: Diagnosis not present

## 2022-07-05 DIAGNOSIS — D692 Other nonthrombocytopenic purpura: Secondary | ICD-10-CM | POA: Diagnosis not present

## 2022-07-05 DIAGNOSIS — E785 Hyperlipidemia, unspecified: Secondary | ICD-10-CM | POA: Diagnosis not present

## 2022-07-05 DIAGNOSIS — E782 Mixed hyperlipidemia: Secondary | ICD-10-CM

## 2022-07-05 DIAGNOSIS — N1831 Chronic kidney disease, stage 3a: Secondary | ICD-10-CM | POA: Diagnosis not present

## 2022-07-05 DIAGNOSIS — I1310 Hypertensive heart and chronic kidney disease without heart failure, with stage 1 through stage 4 chronic kidney disease, or unspecified chronic kidney disease: Secondary | ICD-10-CM | POA: Diagnosis not present

## 2022-07-05 DIAGNOSIS — Z889 Allergy status to unspecified drugs, medicaments and biological substances status: Secondary | ICD-10-CM | POA: Diagnosis not present

## 2022-07-05 MED ORDER — ROSUVASTATIN CALCIUM 5 MG PO TABS: 5.0000 mg | ORAL_TABLET | Freq: Every day | ORAL | 1 refills | Status: DC

## 2022-07-05 NOTE — Telephone Encounter (Signed)
Patient said she saw her PCP, and they want to determine what medication she should be on. Please advise

## 2022-07-05 NOTE — Telephone Encounter (Signed)
This is a very vague message. Called pt to clarify what is needed.  She's been taking Crestor '10mg'$  daily but still having some leg and arm aches. Had lipids checked at PCP office today, results not back yet.  Will decrease Crestor to '5mg'$  daily, pt will monitor to see if sx improve. Will call her when labs result next week, discussed multiple other lipid lowering options at her last visit with me and can determine which is most appropriate depending on her lab results. She's tolerating her Vascepa just fine.

## 2022-07-08 MED ORDER — ROSUVASTATIN CALCIUM 10 MG PO TABS: 10.0000 mg | ORAL_TABLET | Freq: Every day | ORAL | 3 refills | Status: DC

## 2022-07-08 MED ORDER — NEXLIZET 180-10 MG PO TABS: 1.0000 | ORAL_TABLET | Freq: Every day | ORAL | 3 refills | Status: DC

## 2022-07-08 NOTE — Telephone Encounter (Signed)
Returned call to pt. She reports PCP faxed lab results to our office. We received a cover sheet but it doesn't have her cholesterol results.  Called [facility], left message with medical records.   Pt reports she was off of rosuvastatin '10mg'$  for 1 week before her labs were checked. On Friday,we discussed decreasing her dose to '5mg'$  daily for tolerability. She states she wants to increase back to '10mg'$  daily and that she'll deal with side effects because she wants her cholesterol as low as possible. Discussed that we have other non-statin options that can be used as well once we find her max tolerated statin dose. She feels that her cholesterol isn't being taken seriously because she is only on a low dose of rosuvastatin. Discussed that she can still see close to a 50% LDL drop on rosuvastatin '5mg'$  daily, and that we are waiting on her PCP labs to see how much more LDL lowering is needed to reach her goal < 55. Branded meds like Nexlizet or Repatha are options if needed but would be more expensive than adding on something like ezetimibe, which may be all she needs depending on how her follow up lipids look.

## 2022-07-08 NOTE — Addendum Note (Signed)
Addended by: Shameek Nyquist E on: 07/08/2022 01:45 PM   Modules accepted: Orders

## 2022-07-08 NOTE — Telephone Encounter (Addendum)
PCP labs from 07/05/22: TC 174, TG 107, HDL 57, LDL 96.   She will resume rosuvastatin '10mg'$  daily which caused some leg and arm pain but no cramping per her preference. She wishes to add on Nexlizet. Will recheck lipids in 5 weeks. If LDL far below goal, will discuss decreasing her rosuvastatin dose to '5mg'$  at that time for tolerability, however she wishes to continue at higher dose for now for better efficacy.  Nexlizet prior auth submitted and approved immediately through 06/17/23.

## 2022-07-08 NOTE — Telephone Encounter (Signed)
Pt calling back wanting to ask some additional questions about her medications.

## 2022-07-24 ENCOUNTER — Other Ambulatory Visit: Payer: Self-pay | Admitting: Cardiovascular Disease

## 2022-07-25 ENCOUNTER — Ambulatory Visit: Payer: Medicare PPO | Admitting: Physician Assistant

## 2022-07-25 ENCOUNTER — Ambulatory Visit (HOSPITAL_COMMUNITY)
Admission: RE | Admit: 2022-07-25 | Discharge: 2022-07-25 | Disposition: A | Discharge status: Home / Self Care | Payer: Medicare PPO | Source: Ambulatory Visit | Attending: Vascular Surgery | Admitting: Vascular Surgery

## 2022-07-25 VITALS — BP 153/82 | HR 73 | Temp 97.6°F | Ht 62.0 in | Wt 158.0 lb

## 2022-07-25 DIAGNOSIS — I6523 Occlusion and stenosis of bilateral carotid arteries: Secondary | ICD-10-CM | POA: Diagnosis not present

## 2022-07-25 NOTE — Progress Notes (Signed)
Established Carotid Patient   History of Present Illness   [name] is a 82 y.o. (1941/04/02) female who presents for surveillance of carotid artery stenosis.  She has a history of right ICA stenosis last measured at 60 to 79%.  She has no history of stroke or TIA.  At follow-up today she is doing well.  She denies any strokelike symptoms such as sudden onset of weakness/numbness, sudden changes in vision, or slurred speech.  She also denies any rest pain, claudication, nonhealing wounds of lower extremities.   She currently takes Plavix and statin.  Current Outpatient Medications  Medication Sig Dispense Refill   amLODipine (NORVASC) 2.5 MG tablet TAKE 1 TABLET BY MOUTH EVERY DAY 90 tablet 3   Bempedoic Acid-Ezetimibe (NEXLIZET) 180-10 MG TABS Take 1 tablet by mouth daily. 90 tablet 3   cholecalciferol (VITAMIN D) 1000 UNITS tablet Take 1,000 Units by mouth daily.     clopidogrel (PLAVIX) 75 MG tablet TAKE 1 TABLET BY MOUTH EVERY DAY 90 tablet 3   Coenzyme Q10 (COQ10 PO) Take 1 capsule by mouth daily.     EPINEPHrine 0.3 mg/0.3 mL IJ SOAJ injection See admin instructions.     fluticasone (FLONASE) 50 MCG/ACT nasal spray Place 1 spray into both nostrils as needed for allergies.   2   levocetirizine (XYZAL) 5 MG tablet Take 1 tablet by mouth daily.     Multiple Vitamin (MULTIVITAMIN WITH MINERALS) TABS Take 1 tablet by mouth daily.     nitroGLYCERIN (NITROSTAT) 0.4 MG SL tablet Place 1 tablet (0.4 mg total) under the tongue every 5 (five) minutes as needed for chest pain. 25 tablet 5   Psyllium 30.9 % POWD Take by mouth 2 (two) times daily.     rosuvastatin (CRESTOR) 10 MG tablet Take 1 tablet (10 mg total) by mouth daily. 90 tablet 3   VASCEPA 1 g capsule Take 2 capsules (2 g total) by mouth 2 (two) times daily. 360 capsule 3   vitamin B-12 (CYANOCOBALAMIN) 1000 MCG tablet Take 1,000 mcg by mouth daily.     vitamin C (ASCORBIC ACID) 500 MG tablet Take 500 mg by mouth daily.      No current facility-administered medications for this visit.    REVIEW OF SYSTEMS (negative unless checked):   Cardiac:  '[]'$  Chest pain or chest pressure? '[]'$  Shortness of breath upon activity? '[]'$  Shortness of breath when lying flat? '[]'$  Irregular heart rhythm?  Vascular:  '[]'$  Pain in calf, thigh, or hip brought on by walking? '[]'$  Pain in feet at night that wakes you up from your sleep? '[]'$  Blood clot in your veins? '[]'$  Leg swelling?  Pulmonary:  '[]'$  Oxygen at home? '[]'$  Productive cough? '[]'$  Wheezing?  Neurologic:  '[]'$  Sudden weakness in arms or legs? '[]'$  Sudden numbness in arms or legs? '[]'$  Sudden onset of difficult speaking or slurred speech? '[]'$  Temporary loss of vision in one eye? '[]'$  Problems with dizziness?  Gastrointestinal:  '[]'$  Blood in stool? '[]'$  Vomited blood?  Genitourinary:  '[]'$  Burning when urinating? '[]'$  Blood in urine?  Psychiatric:  '[]'$  Major depression  Hematologic:  '[]'$  Bleeding problems? '[]'$  Problems with blood clotting?  Dermatologic:  '[]'$  Rashes or ulcers?  Constitutional:  '[]'$  Fever or chills?  Ear/Nose/Throat:  '[]'$  Change in hearing? '[]'$  Nose bleeds? '[]'$  Sore throat?  Musculoskeletal:  '[]'$  Back pain? '[]'$  Joint pain? '[]'$  Muscle pain?   Physical Examination   Vitals:   07/25/22 0927 07/25/22 0928  BP: Marland Kitchen)  154/85 (!) 153/82  Pulse: 73   Temp: 97.6 F (36.4 C)   TempSrc: Temporal   SpO2: 98%   Weight: 158 lb (71.7 kg)   Height: '5\' 2"'$  (1.575 m)    Body mass index is 28.9 kg/m.  General:  WDWN in NAD; vital signs documented above Gait: Not observed HENT: WNL, normocephalic Pulmonary: normal non-labored breathing  Cardiac: Regular rate and rhythm, no carotid bruit Abdomen: soft, NT, no masses Skin: without rashes Vascular Exam/Pulses: 2+ radial pulses bilaterally Extremities: without ischemic changes, without Gangrene , without cellulitis; without open wounds;  Musculoskeletal: no muscle wasting or atrophy  Neurologic: A&O X 3;  No focal  weakness or paresthesias are detected Psychiatric:  The pt has Normal affect.  Non-Invasive Vascular Imaging   B Carotid Duplex (07/25/2022):  R ICA stenosis:  60-79% R VA:  patent and antegrade L ICA stenosis:  1-39% L VA:  patent and antegrade   Medical Decision Making   [name] is a 82 y.o. female who presents for surveillance of carotid artery stenosis.  Based on the patient's vascular studies, the patient's bilateral carotid artery stenosis is unchanged.  Right ICA stenosis is measured at 60 to 70% with PSV of 250 cm/s.  Left ICA stenosis at 1 to 39%. She denies any strokelike symptoms such as sudden weakness/numbness, vision changes, or slurred speech.  She has 2+ radial pulses bilaterally She is currently medically managed with Plavix and statin daily She can follow-up with our office in 6 months with repeat bilateral carotid duplex study   Vicente Serene PA-C [facility] Office: [phone]  Call MD: Virl Cagey

## 2022-07-30 ENCOUNTER — Other Ambulatory Visit: Payer: Self-pay

## 2022-07-30 DIAGNOSIS — I6523 Occlusion and stenosis of bilateral carotid arteries: Secondary | ICD-10-CM

## 2022-08-12 ENCOUNTER — Ambulatory Visit: Payer: Medicare PPO | Attending: Vascular Surgery

## 2022-08-12 DIAGNOSIS — E782 Mixed hyperlipidemia: Secondary | ICD-10-CM | POA: Diagnosis not present

## 2022-08-13 LAB — HEPATIC FUNCTION PANEL
ALT: 26 IU/L (ref 0–32)
AST: 32 IU/L (ref 0–40)
Albumin: 4.8 g/dL — ABNORMAL HIGH (ref 3.7–4.7)
Alkaline Phosphatase: 60 IU/L (ref 44–121)
Bilirubin Total: 0.5 mg/dL (ref 0.0–1.2)
Bilirubin, Direct: 0.2 mg/dL (ref 0.00–0.40)
Total Protein: 6.6 g/dL (ref 6.0–8.5)

## 2022-08-13 LAB — LIPID PANEL
Chol/HDL Ratio: 2.1 ratio (ref 0.0–4.4)
Cholesterol, Total: 112 mg/dL (ref 100–199)
HDL: 54 mg/dL (ref >39)
LDL Chol Calc (NIH): 39 mg/dL (ref 0–99)
Triglycerides: 105 mg/dL (ref 0–149)
VLDL Cholesterol Cal: 19 mg/dL (ref 5–40)

## 2022-11-14 DIAGNOSIS — M13851 Other specified arthritis, right hip: Secondary | ICD-10-CM | POA: Diagnosis not present

## 2022-11-27 DIAGNOSIS — M25551 Pain in right hip: Secondary | ICD-10-CM | POA: Diagnosis not present

## 2022-12-09 DIAGNOSIS — D1801 Hemangioma of skin and subcutaneous tissue: Secondary | ICD-10-CM | POA: Diagnosis not present

## 2022-12-09 DIAGNOSIS — L814 Other melanin hyperpigmentation: Secondary | ICD-10-CM | POA: Diagnosis not present

## 2022-12-09 DIAGNOSIS — L821 Other seborrheic keratosis: Secondary | ICD-10-CM | POA: Diagnosis not present

## 2022-12-09 DIAGNOSIS — L570 Actinic keratosis: Secondary | ICD-10-CM | POA: Diagnosis not present

## 2022-12-09 DIAGNOSIS — Z85828 Personal history of other malignant neoplasm of skin: Secondary | ICD-10-CM | POA: Diagnosis not present

## 2022-12-27 DIAGNOSIS — H2511 Age-related nuclear cataract, right eye: Secondary | ICD-10-CM | POA: Diagnosis not present

## 2022-12-27 DIAGNOSIS — H524 Presbyopia: Secondary | ICD-10-CM | POA: Diagnosis not present

## 2023-01-14 DIAGNOSIS — E785 Hyperlipidemia, unspecified: Secondary | ICD-10-CM | POA: Diagnosis not present

## 2023-01-14 DIAGNOSIS — I129 Hypertensive chronic kidney disease with stage 1 through stage 4 chronic kidney disease, or unspecified chronic kidney disease: Secondary | ICD-10-CM | POA: Diagnosis not present

## 2023-01-14 DIAGNOSIS — N1831 Chronic kidney disease, stage 3a: Secondary | ICD-10-CM | POA: Diagnosis not present

## 2023-01-14 DIAGNOSIS — I2510 Atherosclerotic heart disease of native coronary artery without angina pectoris: Secondary | ICD-10-CM | POA: Diagnosis not present

## 2023-01-21 DIAGNOSIS — I1310 Hypertensive heart and chronic kidney disease without heart failure, with stage 1 through stage 4 chronic kidney disease, or unspecified chronic kidney disease: Secondary | ICD-10-CM | POA: Diagnosis not present

## 2023-01-21 DIAGNOSIS — E785 Hyperlipidemia, unspecified: Secondary | ICD-10-CM | POA: Diagnosis not present

## 2023-01-21 DIAGNOSIS — M791 Myalgia, unspecified site: Secondary | ICD-10-CM | POA: Diagnosis not present

## 2023-01-21 DIAGNOSIS — Z1331 Encounter for screening for depression: Secondary | ICD-10-CM | POA: Diagnosis not present

## 2023-01-21 DIAGNOSIS — N1831 Chronic kidney disease, stage 3a: Secondary | ICD-10-CM | POA: Diagnosis not present

## 2023-01-21 DIAGNOSIS — Z1389 Encounter for screening for other disorder: Secondary | ICD-10-CM | POA: Diagnosis not present

## 2023-01-21 DIAGNOSIS — D692 Other nonthrombocytopenic purpura: Secondary | ICD-10-CM | POA: Diagnosis not present

## 2023-01-21 DIAGNOSIS — R82998 Other abnormal findings in urine: Secondary | ICD-10-CM | POA: Diagnosis not present

## 2023-01-21 DIAGNOSIS — T466X5A Adverse effect of antihyperlipidemic and antiarteriosclerotic drugs, initial encounter: Secondary | ICD-10-CM | POA: Diagnosis not present

## 2023-01-21 DIAGNOSIS — I2510 Atherosclerotic heart disease of native coronary artery without angina pectoris: Secondary | ICD-10-CM | POA: Diagnosis not present

## 2023-01-21 DIAGNOSIS — K5900 Constipation, unspecified: Secondary | ICD-10-CM | POA: Diagnosis not present

## 2023-01-21 DIAGNOSIS — Z0000 Encounter for general adult medical examination without abnormal findings: Secondary | ICD-10-CM | POA: Diagnosis not present

## 2023-01-23 ENCOUNTER — Ambulatory Visit (HOSPITAL_COMMUNITY): Payer: Medicare PPO

## 2023-01-23 ENCOUNTER — Ambulatory Visit: Payer: Medicare PPO

## 2023-02-13 ENCOUNTER — Other Ambulatory Visit: Payer: Self-pay | Admitting: Internal Medicine

## 2023-02-13 DIAGNOSIS — Z1231 Encounter for screening mammogram for malignant neoplasm of breast: Secondary | ICD-10-CM

## 2023-02-25 DIAGNOSIS — J3089 Other allergic rhinitis: Secondary | ICD-10-CM | POA: Diagnosis not present

## 2023-02-25 DIAGNOSIS — L501 Idiopathic urticaria: Secondary | ICD-10-CM | POA: Diagnosis not present

## 2023-02-25 DIAGNOSIS — J3081 Allergic rhinitis due to animal (cat) (dog) hair and dander: Secondary | ICD-10-CM | POA: Diagnosis not present

## 2023-03-03 ENCOUNTER — Other Ambulatory Visit: Payer: Self-pay | Admitting: Cardiovascular Disease

## 2023-03-17 ENCOUNTER — Ambulatory Visit: Payer: Medicare PPO

## 2023-03-23 NOTE — Progress Notes (Unsigned)
No chief complaint on file.  History of Present Illness: 82 yo female with history of CAD with previous stenting of the mid LAD, Diagonal and Proximal and mid RCA, HTN, HLD, OSA, carotid artery disease here today for follow up. Her cardiac history includes drug eluting stents placed in the LAD and Diagonal in May 2008, followed by drug eluting stent placement in the RCA in December 2008. At her initial visit in our office November 2012, she had c/o left arm pain and her prior anginal equivalent was left arm pain. Stress test in November 2012 with no ischemia. I saw her June 2013 and she c/o recurrence of pain in her left arm with exertion associated with fatigue and SOB. Cardiac cath June 2013 with stable disease, patent stents LAD, Diagonal, RCA. Admitted July 2014 with left shoulder pain. Stress myoview 12/28/12 with no ischemia. She had some left sided weakness in March 2015 and was seen in [facility] and was found to have moderate carotid artery disease. She has been followed in the [facility] office for her carotid disease. Nuclear stress test February 2023 with no ischemia. Echo September 2023 with LVEF=55-60%. No significant valve disease.  Carotid artery dopplers February 2024 with moderate RICA stenosis (60-79%) and mild disease in the LICA.   She is here today for follow up. The patient denies any chest pain, dyspnea, palpitations, lower extremity edema, orthopnea, PND, dizziness, near syncope or syncope.      Primary Care Physician: Creola Corn, MD  Past Medical History:  Diagnosis Date   Allergy    Arthritis    CAD (coronary artery disease)    a. DES to LAD, diagonal 10/2006. b. DES to RCA 05/2007. c. Last cath 11/2012: patent stents, stable dz. d. Eugenie Birks cardiolite negative 12/2012.   Cataract    left eye   Diverticulitis    Diverticulosis of colon    GERD (gastroesophageal reflux disease)    Hemorrhoids    History of recurrent UTIs    Hx of adenomatous colonic polyps    tubular    Hyperlipidemia    Hypertension    Peripheral vascular disease (HCC)    Sleep apnea    wears CPAP- 03/07/2020 no longer wears CPAP    Past Surgical History:  Procedure Laterality Date   ANGIOPLASTY  5/08  and  12/08    stent x3   Cataract left     COLONOSCOPY     EYE SURGERY Left    laser eye surgery for blood leakage   FOOT TENDON SURGERY Left Sept. 20, 2016    Left Calf / foot -   Dr. Lajoyce Corners   MASS EXCISION Right 07/13/2019   Procedure: EXCISION VOLAR CYST RIGHT WRIST;  Surgeon: Cindee Salt, MD;  [facility];  Service: Orthopedics;  Laterality: Right;  IV REGIONAL FOREARM BLOCK   Right shoulder surgery     Vitrectomy left eye      Current Outpatient Medications  Medication Sig Dispense Refill   amLODipine (NORVASC) 2.5 MG tablet TAKE 1 TABLET BY MOUTH EVERY DAY 90 tablet 3   Bempedoic Acid-Ezetimibe (NEXLIZET) 180-10 MG TABS Take 1 tablet by mouth daily. 90 tablet 3   cholecalciferol (VITAMIN D) 1000 UNITS tablet Take 1,000 Units by mouth daily.     clopidogrel (PLAVIX) 75 MG tablet TAKE 1 TABLET BY MOUTH EVERY DAY 90 tablet 1   Coenzyme Q10 (COQ10 PO) Take 1 capsule by mouth daily.     EPINEPHrine 0.3 mg/0.3 mL IJ  SOAJ injection See admin instructions.     fluticasone (FLONASE) 50 MCG/ACT nasal spray Place 1 spray into both nostrils as needed for allergies.   2   levocetirizine (XYZAL) 5 MG tablet Take 1 tablet by mouth daily.     Multiple Vitamin (MULTIVITAMIN WITH MINERALS) TABS Take 1 tablet by mouth daily.     nitroGLYCERIN (NITROSTAT) 0.4 MG SL tablet PLACE 1 TABLET UNDER THE TONGUE EVERY 5 MINUTES AS NEEDED FOR CHEST PAIN. 25 tablet 8   Psyllium 30.9 % POWD Take by mouth 2 (two) times daily.     rosuvastatin (CRESTOR) 10 MG tablet Take 1 tablet (10 mg total) by mouth daily. 90 tablet 3   VASCEPA 1 g capsule Take 2 capsules (2 g total) by mouth 2 (two) times daily. 360 capsule 3   vitamin B-12 (CYANOCOBALAMIN) 1000 MCG tablet Take 1,000 mcg by mouth  daily.     vitamin C (ASCORBIC ACID) 500 MG tablet Take 500 mg by mouth daily.     No current facility-administered medications for this visit.    Allergies  Allergen Reactions   Other Hives, Itching and Swelling    VICRYL STITCHES Tongue swelling Other reaction(s): Unknown   Fluoride Preparations Other (See Comments)    A test for eye to make pictures, (  causing pressure of Left arm )   Rosuvastatin Other (See Comments)    Leg pains, cramping    Social History   Socioeconomic History   Marital status: Married    Spouse name: Not on file   Number of children: 0   Years of education: Not on file   Highest education level: Not on file  Occupational History   Occupation: retired     Associate Professor: RETIRED  Tobacco Use   Smoking status: Never   Smokeless tobacco: Never  Vaping Use   Vaping status: Never Used  Substance and Sexual Activity   Alcohol use: Yes    Alcohol/week: 2.0 - 3.0 standard drinks of alcohol    Types: 2 - 3 Standard drinks or equivalent per week    Comment: 1 per day   Drug use: No   Sexual activity: Not on file  Other Topics Concern   Not on file  Social History Narrative   Not on file   Social Determinants of Health   Financial Resource Strain: Not on file  Food Insecurity: Not on file  Transportation Needs: Not on file  Physical Activity: Not on file  Stress: Not on file  Social Connections: Not on file  Intimate Partner Violence: Not on file    Family History  Problem Relation Age of Onset   Colon polyps Brother    Heart disease Brother        before age 69   Hyperlipidemia Brother    Prostate cancer Brother    Heart disease Father        before age 30   Deep vein thrombosis Father    Hyperlipidemia Father    Heart attack Father 47   Stroke Father    Heart disease Brother    Heart attack Brother    Cancer Brother        Merkle cell   Stroke Mother        TIA's   Dementia Mother    Ovarian cancer Maternal Grandmother    Colon  polyps Paternal Grandfather    Liver cancer Paternal Grandfather    Heart disease Other  Grandmother    Colon cancer Neg Hx    Esophageal cancer Neg Hx    Stomach cancer Neg Hx    Rectal cancer Neg Hx     Review of Systems:  As stated in the HPI and otherwise negative.   There were no vitals taken for this visit.  Physical Examination:  General: Well developed, well nourished, NAD  HEENT: OP clear, mucus membranes moist  SKIN: warm, dry. No rashes. Neuro: No focal deficits  Musculoskeletal: Muscle strength 5/5 all ext  Psychiatric: Mood and affect normal  Neck: No JVD, no carotid bruits, no thyromegaly, no lymphadenopathy.  Lungs:Clear bilaterally, no wheezes, rhonci, crackles Cardiovascular: Regular rate and rhythm. No murmurs, gallops or rubs. Abdomen:Soft. Bowel sounds present. Non-tender.  Extremities: No lower extremity edema. Pulses are 2 + in the bilateral DP/PT.  Cardiac cath 11/27/11:  Left main:  Left Anterior Descending Artery: Large caliber vessel that courses to the apex. There is a patent stent in the proximal to mid LAD with minimal, 20% in stent restenosis. The diagonal branch arises just before the LAD stent. There is a patent stent in the ostium and proximal segment of the diagonal branch with perhaps 20% ostial stenosis.  Circumflex Artery: Moderate sized vessel with no disease noted. Small early OM branch with no disease.  Right Coronary Artery: Large, dominant vessel with patent stent in the proximal vessel. Just before the stent, there is a 20% plaque. Just beyond the stent, there is a 30% plaque.  Left Ventricular Angiogram: LVEF 65-70%.   EKG:  EKG is *** ordered today. The ekg ordered today demonstrates   Recent Labs: 08/12/2022: ALT 26   Lipid Panel Followed in primary care   Wt Readings from Last 3 Encounters:  07/25/22 71.7 kg  02/19/22 72.4 kg  10/16/21 73.3 kg    Assessment and Plan:   1. CAD without angina: She has had stents  placed in the LAD, Diagonal and RCA. Last cath in June 2013 with stable CAD.  Nuclear stress test February 2023 with no ischemia. She has no chest pain suggestive of angina. Continue Plavix and statin.     2. HTN: BP is controlled. No changes today  3. HYPERLIPIDEMIA: Lipids followed in primary care. LDL 7***. Continue statin.   4. Carotid artery disease: Moderate RICA stenosis by dopplers in February 2024. Followed in [facility]  5. Cardiac murmur: Noted in 2023. No valve disease noted by echo in 2023.   Current medicines are reviewed at length with the patient today.  The patient does not have concerns regarding medicines.  The following changes have been made:  no change  Labs/ tests ordered today include:   No orders of the defined types were placed in this encounter.  Disposition:   F/U with me in 12  months  Signed, Verne Carrow, MD 03/23/2023 5:15 PM    [facility] [address] [place], [place], [place]  [address] Phone: [phone]; Fax: [phone]

## 2023-03-24 ENCOUNTER — Encounter: Payer: Self-pay | Admitting: Cardiovascular Disease

## 2023-03-24 ENCOUNTER — Ambulatory Visit: Payer: Medicare PPO | Attending: Cardiovascular Disease | Admitting: Cardiovascular Disease

## 2023-03-24 VITALS — BP 124/78 | HR 74 | Ht 62.0 in | Wt 154.2 lb

## 2023-03-24 DIAGNOSIS — I10 Essential (primary) hypertension: Secondary | ICD-10-CM

## 2023-03-24 DIAGNOSIS — I2510 Atherosclerotic heart disease of native coronary artery without angina pectoris: Secondary | ICD-10-CM | POA: Diagnosis not present

## 2023-03-24 DIAGNOSIS — E7800 Pure hypercholesterolemia, unspecified: Secondary | ICD-10-CM

## 2023-03-24 DIAGNOSIS — I6523 Occlusion and stenosis of bilateral carotid arteries: Secondary | ICD-10-CM

## 2023-03-24 NOTE — Patient Instructions (Signed)
Medication Instructions:  No changes *If you need a refill on your cardiac medications before your next appointment, please call your pharmacy*   Lab Work: none   Testing/Procedures: none   Follow-Up: At [facility], you and your health needs are our priority.  As part of our continuing mission to provide you with exceptional heart care, we have created designated Provider Care Teams.  These Care Teams include your primary Cardiologist (physician) and Advanced Practice Providers (APPs -  Physician Assistants and Nurse Practitioners) who all work together to provide you with the care you need, when you need it.   Your next appointment:   12 month(s)  Provider:   Christopher McAlhany, MD   

## 2023-04-01 ENCOUNTER — Ambulatory Visit (HOSPITAL_COMMUNITY)
Admission: RE | Admit: 2023-04-01 | Discharge: 2023-04-01 | Disposition: A | Discharge status: Home / Self Care | Payer: Medicare PPO | Source: Ambulatory Visit | Attending: Vascular Surgery | Admitting: Vascular Surgery

## 2023-04-01 ENCOUNTER — Ambulatory Visit: Payer: Medicare PPO | Admitting: Physician Assistant

## 2023-04-01 VITALS — BP 145/75 | HR 62 | Temp 97.4°F | Resp 18 | Ht 62.0 in | Wt 156.1 lb

## 2023-04-01 DIAGNOSIS — I6523 Occlusion and stenosis of bilateral carotid arteries: Secondary | ICD-10-CM

## 2023-04-01 NOTE — Progress Notes (Signed)
Office Note     CC:  follow up Requesting Provider:  Creola Corn, MD  HPI: [name] is a 82 y.o. ([birth year]) female who presents for surveillance of carotid artery stenosis.  She has been followed for years by our clinic for this reason.  Over the past several years she has been seen on a 73-month interval due to 60 to 70% stenosis of the right ICA.  She denies any CVA or TIA diagnosis since last office visit.  She also denies any neurological events including slurring speech, changes in vision, or one-sided weakness.  She continues to take her statin daily.  She is also on daily Plavix prescribed by her cardiologist with history of PCI involving stenting.  She denies tobacco use.  She follows regularly with her PCP for management of chronic medical conditions including hypertension and hyperlipidemia.   Past Medical History:  Diagnosis Date   Allergy    Arthritis    CAD (coronary artery disease)    a. DES to LAD, diagonal 10/2006. b. DES to RCA 05/2007. c. Last cath 11/2012: patent stents, stable dz. d. Eugenie Birks cardiolite negative 12/2012.   Cataract    left eye   Diverticulitis    Diverticulosis of colon    GERD (gastroesophageal reflux disease)    Hemorrhoids    History of recurrent UTIs    Hx of adenomatous colonic polyps    tubular   Hyperlipidemia    Hypertension    Peripheral vascular disease (HCC)    Sleep apnea    wears CPAP- 03/07/2020 no longer wears CPAP    Past Surgical History:  Procedure Laterality Date   ANGIOPLASTY  5/08  and  12/08    stent x3   Cataract left     COLONOSCOPY     EYE SURGERY Left    laser eye surgery for blood leakage   FOOT TENDON SURGERY Left Sept. 20, 2016    Left Calf / foot -   Dr. Lajoyce Corners   MASS EXCISION Right 07/13/2019   Procedure: EXCISION VOLAR CYST RIGHT WRIST;  Surgeon: Cindee Salt, MD;  Location: [facility];  Service: Orthopedics;  Laterality: Right;  IV REGIONAL FOREARM BLOCK   Right shoulder surgery      Vitrectomy left eye      Social History   Socioeconomic History   Marital status: Married    Spouse name: Not on file   Number of children: 0   Years of education: Not on file   Highest education level: Not on file  Occupational History   Occupation: retired     Associate Professor: RETIRED  Tobacco Use   Smoking status: Never   Smokeless tobacco: Never  Vaping Use   Vaping status: Never Used  Substance and Sexual Activity   Alcohol use: Yes    Alcohol/week: 2.0 - 3.0 standard drinks of alcohol    Types: 2 - 3 Standard drinks or equivalent per week    Comment: 1 per day   Drug use: No   Sexual activity: Not on file  Other Topics Concern   Not on file  Social History Narrative   Not on file   Social Determinants of Health   Financial Resource Strain: Not on file  Food Insecurity: Not on file  Transportation Needs: Not on file  Physical Activity: Not on file  Stress: Not on file  Social Connections: Not on file  Intimate Partner Violence: Not on file    Family History  Problem  Relation Age of Onset   Colon polyps Brother    Heart disease Brother        before age 25   Hyperlipidemia Brother    Prostate cancer Brother    Heart disease Father        before age 71   Deep vein thrombosis Father    Hyperlipidemia Father    Heart attack Father 68   Stroke Father    Heart disease Brother    Heart attack Brother    Cancer Brother        Merkle cell   Stroke Mother        TIA's   Dementia Mother    Ovarian cancer Maternal Grandmother    Colon polyps Paternal Grandfather    Liver cancer Paternal Grandfather    Heart disease Other        Grandmother    Colon cancer Neg Hx    Esophageal cancer Neg Hx    Stomach cancer Neg Hx    Rectal cancer Neg Hx     Current Outpatient Medications  Medication Sig Dispense Refill   amLODipine (NORVASC) 2.5 MG tablet TAKE 1 TABLET BY MOUTH EVERY DAY 90 tablet 3   Bempedoic Acid-Ezetimibe (NEXLIZET) 180-10 MG TABS Take 1 tablet by  mouth daily. 90 tablet 3   cholecalciferol (VITAMIN D) 1000 UNITS tablet Take 1,000 Units by mouth daily.     clopidogrel (PLAVIX) 75 MG tablet TAKE 1 TABLET BY MOUTH EVERY DAY 90 tablet 1   Coenzyme Q10 (COQ10 PO) Take 1 capsule by mouth daily.     EPINEPHrine 0.3 mg/0.3 mL IJ SOAJ injection See admin instructions.     fluticasone (FLONASE) 50 MCG/ACT nasal spray Place 1 spray into both nostrils as needed for allergies.   2   levocetirizine (XYZAL) 5 MG tablet Take 1 tablet by mouth daily.     Multiple Vitamin (MULTIVITAMIN WITH MINERALS) TABS Take 1 tablet by mouth daily.     nitroGLYCERIN (NITROSTAT) 0.4 MG SL tablet PLACE 1 TABLET UNDER THE TONGUE EVERY 5 MINUTES AS NEEDED FOR CHEST PAIN. 25 tablet 8   Psyllium 30.9 % POWD Take by mouth 2 (two) times daily.     rosuvastatin (CRESTOR) 10 MG tablet Take 1 tablet (10 mg total) by mouth daily. 90 tablet 3   VASCEPA 1 g capsule Take 2 capsules (2 g total) by mouth 2 (two) times daily. 360 capsule 3   vitamin B-12 (CYANOCOBALAMIN) 1000 MCG tablet Take 1,000 mcg by mouth daily.     vitamin C (ASCORBIC ACID) 500 MG tablet Take 500 mg by mouth daily.     No current facility-administered medications for this visit.    Allergies  Allergen Reactions   Other Hives, Itching and Swelling    VICRYL STITCHES Tongue swelling Other reaction(s): Unknown   Fluoride Preparations Other (See Comments)    A test for eye to make pictures, (  causing pressure of Left arm )   Rosuvastatin Other (See Comments)    Leg pains, cramping     REVIEW OF SYSTEMS:   [X]  denotes positive finding, [ ]  denotes negative finding Cardiac  Comments:  Chest pain or chest pressure:    Shortness of breath upon exertion:    Short of breath when lying flat:    Irregular heart rhythm:        Vascular    Pain in calf, thigh, or hip brought on by ambulation:    Pain in feet at night  that wakes you up from your sleep:     Blood clot in your veins:    Leg swelling:          Pulmonary    Oxygen at home:    Productive cough:     Wheezing:         Neurologic    Sudden weakness in arms or legs:     Sudden numbness in arms or legs:     Sudden onset of difficulty speaking or slurred speech:    Temporary loss of vision in one eye:     Problems with dizziness:         Gastrointestinal    Blood in stool:     Vomited blood:         Genitourinary    Burning when urinating:     Blood in urine:        Psychiatric    Major depression:         Hematologic    Bleeding problems:    Problems with blood clotting too easily:        Skin    Rashes or ulcers:        Constitutional    Fever or chills:      PHYSICAL EXAMINATION:  Vitals:   04/01/23 1242  BP: (!) 145/75  Pulse: 62  Resp: 18  Temp: (!) 97.4 F (36.3 C)  SpO2: 96%  Weight: 156 lb 1.6 oz (70.8 kg)  Height: 5\' 2"  (1.575 m)    General:  WDWN in NAD; vital signs documented above Gait: Not observed HENT: WNL, normocephalic Pulmonary: normal non-labored breathing , without Rales, rhonchi,  wheezing Cardiac: regular HR Abdomen: soft, NT, no masses Skin: without rashes Vascular Exam/Pulses: palpable and symmetrical DP and radial pulses Extremities: without ischemic changes, without Gangrene , without cellulitis; without open wounds;  Musculoskeletal: no muscle wasting or atrophy  Neurologic: A&O X 3; CN grossly intact Psychiatric:  The pt has Normal affect.   Non-Invasive Vascular Imaging:   Right ICA 60 to 79% stenosis  Left ICA 1 to 39% stenosis    ASSESSMENT/PLAN:: 82 y.o. female here for follow up for surveillance of carotid artery stenosis  -Subjectively the patient has not experienced any strokelike symptoms since last office visit including slurring speech, changes in vision, or one-sided weakness.  Carotid duplex is stable.  Right ICA stenosis is 60 to 79% and left ICA stenosis is 1 to 39%.  No indication for revascularization of asymptomatic right ICA stenosis at this  time.  She will continue her Plavix and statin daily.  We will repeat carotid duplex in 6 months.  She will continue to follow regularly with her PCP for management of chronic medical conditions including hypertension and hyperlipidemia.   Emilie Rutter, PA-C [facility] [phone]  Clinic MD:   Lenell Antu

## 2023-04-09 ENCOUNTER — Ambulatory Visit
Admission: RE | Admit: 2023-04-09 | Discharge: 2023-04-09 | Disposition: A | Discharge status: Home / Self Care | Payer: Medicare PPO | Source: Ambulatory Visit | Attending: Internal Medicine | Admitting: Internal Medicine

## 2023-04-09 DIAGNOSIS — Z1231 Encounter for screening mammogram for malignant neoplasm of breast: Secondary | ICD-10-CM

## 2023-04-11 ENCOUNTER — Telehealth: Payer: Self-pay | Admitting: Cardiovascular Disease

## 2023-04-11 MED ORDER — NEXLIZET 180-10 MG PO TABS: 1.0000 | ORAL_TABLET | Freq: Every day | ORAL | 3 refills | Status: DC

## 2023-04-11 NOTE — Telephone Encounter (Signed)
Pt's medication was sent to pt's pharmacy as requested. Confirmation received.  °

## 2023-04-11 NOTE — Telephone Encounter (Signed)
 *  STAT* If patient is at the pharmacy, call can be transferred to refill team.   1. Which medications need to be refilled? (please list name of each medication and dose if known)   Bempedoic Acid-Ezetimibe (NEXLIZET) 180-10 MG TABS    2. Would you like to learn more about the convenience, safety, & potential cost savings by using the [facility]?   3. Are you open to using the [facility] (Type [facility]).   4. Which pharmacy/location (including street and city if local pharmacy) is medication to be sent to?  [id number] [address], [place] [phone]    5. Do they need a 30 day or 90 day supply?  90  Patient is completely out of medication

## 2023-04-25 ENCOUNTER — Other Ambulatory Visit: Payer: Self-pay

## 2023-04-25 DIAGNOSIS — I6523 Occlusion and stenosis of bilateral carotid arteries: Secondary | ICD-10-CM

## 2023-05-05 ENCOUNTER — Telehealth: Payer: Self-pay | Admitting: Cardiovascular Disease

## 2023-05-05 NOTE — Telephone Encounter (Signed)
[facility] can you schedule patient a visit with pharmaD at the end of Dec or early Jan to discuss options?

## 2023-05-05 NOTE — Telephone Encounter (Signed)
Patient called and said that the medication Bempedoic Acid-Ezetimibe (NEXLIZET) 180-10 MG TABS is also going from $80 to $128 for a 90 day supply next year

## 2023-05-05 NOTE — Telephone Encounter (Signed)
Pt c/o medication issue:  1. Name of Medication:   Bempedoic Acid-Ezetimibe (NEXLIZET) 180-10 MG TABS    2. How are you currently taking this medication (dosage and times per day)? Take 1 tablet by mouth daily.   3. Are you having a reaction (difficulty breathing--STAT)? No   4. What is your medication issue? Pt states that per insurance medication has moved to another tier and will be more expensive. She says that they offered the below alternatives and would like to know what Dr. Clifton James suggest: Atorvastatin, Ezitimide or Repatha. Please advise

## 2023-05-10 ENCOUNTER — Other Ambulatory Visit: Payer: Self-pay | Admitting: Cardiovascular Disease

## 2023-06-16 ENCOUNTER — Other Ambulatory Visit (HOSPITAL_COMMUNITY): Payer: Self-pay

## 2023-06-23 ENCOUNTER — Ambulatory Visit: Payer: Medicare PPO | Attending: Cardiology | Admitting: Pharmacist

## 2023-06-23 DIAGNOSIS — E782 Mixed hyperlipidemia: Secondary | ICD-10-CM | POA: Diagnosis not present

## 2023-06-23 NOTE — Progress Notes (Signed)
 Patient ID: [name]                 DOB: [birth year]                    MRN: [id number]     HPI: [name] is a 83 y.o. female patient referred to lipid clinic by Dr Verlin. PMH is significant for CAD s/p stenting to mid LAD and diag in 10/2006, DES to RCA in 05/2007, PVD, HTN, HLD, and OSA. At last visit with Dr Verlin in September 2023, Crestor  40mg  was increased from 3x a week to 4x a week. 3 weeks later, pt contacted clinic reporting pain in her joints and inflammation. She was advised to stop statin for 2 weeks and reported improvement in symptoms.   Patient was seen 05/13/22 in lipid clinic. She was tried on rosuvastatin  10mg  daily due to intolerance at higher doses. She was also started on Nexlizet  in Jan 2024. LDL-C in Feb 2024 was 39. Patient called office in Nov reporting that in 2025 the cost of her Nexlizet  was going to increase to $128/90 days instead on $80. An appointment was made to discuss medication options. Her insurance is [employer] and it appears Nexlizet  is now non-formulary.  Patient presents today to clinic. She is a very pleasant 83 year old female. Lives a [place]. Cooks at home with her husband. We reviewed the options together.   Options- Continue rosuvastatin  and change just to zetia Continue rosuvastaitn- change to Repatha  Continue rosuvastatin - pay for Nexlizet  (grant??)  Patient does not want to accept a [employer] grant as she feels her convictions are strong that there is someone out there that needs it more than her.  Current Medications: Vascepa  2g BID, rosuvastatin  10mg  daily, Nexlizet  180-10mg  daily Intolerances: rosuvastatin  20-40mg  daily - leg pain and cramping Risk Factors: CAD, PAD, HTN, age, FHx LDL goal: 55mg /dL  Diet: 1 egg for breakfast 5 days a week, oatmeal the other days. Little meat, lots of fruit and green vegetables. Likes sweets every once in a while. Avoids fried food  Exercise: walks 2-3  miles a day  Family History:   Colon polyps Brother     Heart disease Brother          before age 40   Hyperlipidemia Brother     Prostate cancer Brother     Heart disease Father          before age 39   Deep vein thrombosis Father     Hyperlipidemia Father     Heart attack Father 47   Stroke Father     Heart disease Brother     Heart attack Brother     Cancer Brother          Merkle cell   Stroke Mother          TIA's   Dementia Mother     Ovarian cancer Maternal Grandmother     Colon polyps Paternal Grandfather     Liver cancer Paternal Grandfather     Heart disease Other          Grandmother     Social History: occasional alcohol , denies drug and tobacco use  Labs: 12/20/21: TC 157, TG 137, HDL 52, LDL 78, nonHDL 105 (rosuvastatin  40mg  3x/week)  Past Medical History:  Diagnosis Date   Allergy    Arthritis    CAD (coronary artery disease)    a. DES to  LAD, diagonal 10/2006. b. DES to RCA 05/2007. c. Last cath 11/2012: patent stents, stable dz. d. Lexiscan  cardiolite  negative 12/2012.   Cataract    left eye   Diverticulitis    Diverticulosis of colon    GERD (gastroesophageal reflux disease)    Hemorrhoids    History of recurrent UTIs    Hx of adenomatous colonic polyps    tubular   Hyperlipidemia    Hypertension    Peripheral vascular disease (HCC)    Sleep apnea    wears CPAP- 03/07/2020 no longer wears CPAP    Current Outpatient Medications on File Prior to Visit  Medication Sig Dispense Refill   amLODipine  (NORVASC ) 2.5 MG tablet TAKE 1 TABLET BY MOUTH EVERY DAY 90 tablet 3   Bempedoic Acid-Ezetimibe (NEXLIZET ) 180-10 MG TABS Take 1 tablet by mouth daily. 90 tablet 3   cholecalciferol  (VITAMIN D) 1000 UNITS tablet Take 1,000 Units by mouth daily.     clopidogrel  (PLAVIX ) 75 MG tablet TAKE 1 TABLET BY MOUTH EVERY DAY 90 tablet 1   Coenzyme Q10 (COQ10 PO) Take 1 capsule by mouth daily.     EPINEPHrine 0.3 mg/0.3 mL IJ SOAJ injection See admin instructions.      fluticasone (FLONASE) 50 MCG/ACT nasal spray Place 1 spray into both nostrils as needed for allergies.   2   levocetirizine (XYZAL ) 5 MG tablet Take 1 tablet by mouth daily.     Multiple Vitamin (MULTIVITAMIN WITH MINERALS) TABS Take 1 tablet by mouth daily.     nitroGLYCERIN  (NITROSTAT ) 0.4 MG SL tablet PLACE 1 TABLET UNDER THE TONGUE EVERY 5 MINUTES AS NEEDED FOR CHEST PAIN. 25 tablet 8   Psyllium 30.9 % POWD Take by mouth 2 (two) times daily.     rosuvastatin  (CRESTOR ) 10 MG tablet Take 1 tablet (10 mg total) by mouth daily. 90 tablet 3   VASCEPA  1 g capsule TAKE 2 CAPSULES BY MOUTH 2 TIMES DAILY. 360 capsule 3   vitamin B-12 (CYANOCOBALAMIN ) 1000 MCG tablet Take 1,000 mcg by mouth daily.     vitamin C  (ASCORBIC ACID ) 500 MG tablet Take 500 mg by mouth daily.     No current facility-administered medications on file prior to visit.    Allergies  Allergen Reactions   Other Hives, Itching and Swelling    VICRYL STITCHES Tongue swelling Other reaction(s): Unknown   Fluoride Preparations Other (See Comments)    A test for eye to make pictures, (  causing pressure of Left arm )   Rosuvastatin  Other (See Comments)    Leg pains, cramping    Assessment/Plan:  1. Hyperlipidemia - After discussion, patient would like to switch to Repatha . Will submit prior authorization. She has about a month of Nexlizet  left. Continue rosuvastatin  10mg  daily and Vascepa . Labs 3 months after switching. Would consider an ApoB.  Thank you,  Quanisha Drewry D Quinesha Selinger, Pharm.D, BCACP, CPP [facility] [address] [place], [place] [address]  Phone: [phone]; Fax: [phone]  06/23/2023 8:16 AM

## 2023-06-27 ENCOUNTER — Telehealth: Payer: Self-pay | Admitting: Pharmacist

## 2023-06-27 DIAGNOSIS — E782 Mixed hyperlipidemia: Secondary | ICD-10-CM

## 2023-06-27 NOTE — Telephone Encounter (Signed)
 Repatha approved through 06/16/24

## 2023-06-30 MED ORDER — REPATHA SURECLICK 140 MG/ML ~~LOC~~ SOAJ: 1.0000 mL | SUBCUTANEOUS | 3 refills | Status: DC

## 2023-06-30 NOTE — Telephone Encounter (Signed)
 Patient made aware of approval. Rx sent in. She still has about 1 month of nexlizet before she will start Repatha. Will get repeat labs in 4 months. Orders are in. I will call patient to remind her.

## 2023-06-30 NOTE — Addendum Note (Signed)
 Addended by: Malena Peer D on: 06/30/2023 04:13 PM   Modules accepted: Orders

## 2023-07-17 ENCOUNTER — Telehealth: Payer: Self-pay | Admitting: Cardiovascular Disease

## 2023-07-17 NOTE — Telephone Encounter (Signed)
Pt c/o medication issue:  1. Name of Medication: Evolocumab (REPATHA SURECLICK) 140 MG/ML SOAJ   2. How are you currently taking this medication (dosage and times per day)? New start  3. Are you having a reaction (difficulty breathing--STAT)? Has not started yet  4. What is your medication issue? Pt requesting a cb to discuss when she should start Repatha

## 2023-07-17 NOTE — Telephone Encounter (Signed)
Called pt advised of [facility] response:  Patient can start Repatha when she finishes her Nexlizet   Pt reports she took last dose today will start Repatha tomorrow.

## 2023-07-17 NOTE — Telephone Encounter (Signed)
Patient can start Repatha when she finishes her Nexlizet

## 2023-07-25 DIAGNOSIS — M791 Myalgia, unspecified site: Secondary | ICD-10-CM | POA: Diagnosis not present

## 2023-07-25 DIAGNOSIS — I700 Atherosclerosis of aorta: Secondary | ICD-10-CM | POA: Diagnosis not present

## 2023-07-25 DIAGNOSIS — T466X5A Adverse effect of antihyperlipidemic and antiarteriosclerotic drugs, initial encounter: Secondary | ICD-10-CM | POA: Diagnosis not present

## 2023-07-25 DIAGNOSIS — E785 Hyperlipidemia, unspecified: Secondary | ICD-10-CM | POA: Diagnosis not present

## 2023-07-25 DIAGNOSIS — N3281 Overactive bladder: Secondary | ICD-10-CM | POA: Diagnosis not present

## 2023-07-25 DIAGNOSIS — I2510 Atherosclerotic heart disease of native coronary artery without angina pectoris: Secondary | ICD-10-CM | POA: Diagnosis not present

## 2023-07-25 DIAGNOSIS — I1310 Hypertensive heart and chronic kidney disease without heart failure, with stage 1 through stage 4 chronic kidney disease, or unspecified chronic kidney disease: Secondary | ICD-10-CM | POA: Diagnosis not present

## 2023-07-25 DIAGNOSIS — N1831 Chronic kidney disease, stage 3a: Secondary | ICD-10-CM | POA: Diagnosis not present

## 2023-07-25 DIAGNOSIS — I8390 Asymptomatic varicose veins of unspecified lower extremity: Secondary | ICD-10-CM | POA: Diagnosis not present

## 2023-07-31 ENCOUNTER — Other Ambulatory Visit (HOSPITAL_COMMUNITY): Payer: Self-pay

## 2023-07-31 ENCOUNTER — Telehealth: Payer: Self-pay | Admitting: Cardiovascular Disease

## 2023-07-31 MED ORDER — ROSUVASTATIN CALCIUM 10 MG PO TABS
10.0000 mg | ORAL_TABLET | Freq: Every day | ORAL | 2 refills | Status: DC
  Filled 2023-07-31: qty 90, 90d supply, fill #0

## 2023-07-31 NOTE — Telephone Encounter (Signed)
*  STAT* If patient is at the pharmacy, call can be transferred to refill team.   1. Which medications need to be refilled? (please list name of each medication and dose if known)  rosuvastatin (CRESTOR) 10 MG tablet  2. Which pharmacy/location (including street and city if local pharmacy) is medication to be sent to? [id number] [address], [place] [phone]  3. Do they need a 30 day or 90 day supply?   90 day supply

## 2023-08-01 ENCOUNTER — Other Ambulatory Visit: Payer: Self-pay | Admitting: Cardiovascular Disease

## 2023-08-01 ENCOUNTER — Other Ambulatory Visit: Payer: Self-pay

## 2023-08-01 ENCOUNTER — Telehealth: Payer: Self-pay | Admitting: Cardiovascular Disease

## 2023-08-01 MED ORDER — ROSUVASTATIN CALCIUM 10 MG PO TABS: 10.0000 mg | ORAL_TABLET | Freq: Every day | ORAL | 2 refills | Status: AC

## 2023-08-01 NOTE — Telephone Encounter (Signed)
Pt's medication was sent to pt's correct pharmacy as requested. I also spoke with the pt to inform her that her medication had been sent to pt's correct pharmacy. I advised the pt that if she has any other problems, questions or concerns, please give our office a call. Pt verbalized understanding.

## 2023-08-01 NOTE — Telephone Encounter (Signed)
Left message for patient to call back.  Unsure which pharmacy she requested it go to.  Crestor was sent earlier today by refill pool.

## 2023-08-01 NOTE — Telephone Encounter (Signed)
Pt c/o medication issue:  1. Name of Medication: rosuvastatin (CRESTOR) 10 MG tablet   2. How are you currently taking this medication (dosage and times per day)? As prescribed   3. Are you having a reaction (difficulty breathing--STAT)? No   4. What is your medication issue? Medication was sent to the wrong pharmacy. [facility] and [facility] denied it. Patient would like a callback notifying her when the medication has been transferred to the correct pharmacy. Please advise.

## 2023-08-01 NOTE — Telephone Encounter (Signed)
Patient is following up requesting to speak with Dr. Gibson Ramp nurse. She states she needs to be seen by MD to address medication changes soon. She states she doesn't feel comfortable with medication changes with out having an appointment.

## 2023-08-02 ENCOUNTER — Other Ambulatory Visit (HOSPITAL_COMMUNITY): Payer: Self-pay

## 2023-08-04 NOTE — Telephone Encounter (Signed)
 Left message for patient to call back

## 2023-08-07 NOTE — Telephone Encounter (Addendum)
Spoke w patient.  Her concern was that she did not have a plan for follow up or blood work after starting Repatha.  I adv she has labs planned by PharmD 3 months after making the switch from Nexlizet to Repatha.  Pt had first Repatha injection 2 weeks ago.  I printed the lab reqs and placed in outgoing mail.  She will come fasting.  Mid May.   She is fine waiting until her yearly visit is due in Oct to see Dr. Clifton James.

## 2023-09-01 ENCOUNTER — Other Ambulatory Visit: Payer: Self-pay | Admitting: Cardiovascular Disease

## 2023-09-30 ENCOUNTER — Ambulatory Visit (HOSPITAL_COMMUNITY): Payer: Medicare PPO

## 2023-09-30 ENCOUNTER — Ambulatory Visit: Payer: Medicare PPO

## 2023-10-28 ENCOUNTER — Ambulatory Visit (HOSPITAL_COMMUNITY)
Admission: RE | Admit: 2023-10-28 | Discharge: 2023-10-28 | Disposition: A | Discharge status: Home / Self Care | Source: Ambulatory Visit | Attending: Vascular Surgery | Admitting: Vascular Surgery

## 2023-10-28 ENCOUNTER — Ambulatory Visit: Attending: Vascular Surgery | Admitting: Physician Assistant

## 2023-10-28 VITALS — BP 133/91 | HR 70 | Temp 97.4°F | Ht 62.0 in | Wt 157.0 lb

## 2023-10-28 DIAGNOSIS — I6523 Occlusion and stenosis of bilateral carotid arteries: Secondary | ICD-10-CM

## 2023-10-28 NOTE — Progress Notes (Signed)
 Office Note   History of Present Illness   [name] is a 83 y.o. (10-02-1940) female who presents for surveillance of carotid artery stenosis.  She has been seen on a 59-month interval for several years for 60 to 79% stenosis of the right ICA.  She has no prior history of carotid revascularization.  She returns today for follow-up.  She says that she is doing well without any issues.  She denies any strokelike symptoms such as slurred speech, facial droop, amaurosis fugax, or sudden weakness/numbness.  She continues to take Plavix  daily.  She just recently started Repatha  as well.  Current Outpatient Medications  Medication Sig Dispense Refill   amLODipine  (NORVASC ) 2.5 MG tablet TAKE 1 TABLET BY MOUTH EVERY DAY 90 tablet 3   cholecalciferol  (VITAMIN D) 1000 UNITS tablet Take 1,000 Units by mouth daily.     clopidogrel  (PLAVIX ) 75 MG tablet TAKE 1 TABLET BY MOUTH EVERY DAY 90 tablet 2   Coenzyme Q10 (COQ10 PO) Take 1 capsule by mouth daily.     EPINEPHrine 0.3 mg/0.3 mL IJ SOAJ injection See admin instructions.     Evolocumab  (REPATHA  SURECLICK) 140 MG/ML SOAJ Inject 140 mg into the skin every 14 (fourteen) days. 6 mL 3   fluticasone (FLONASE) 50 MCG/ACT nasal spray Place 1 spray into both nostrils as needed for allergies.   2   levocetirizine (XYZAL) 5 MG tablet Take 1 tablet by mouth daily.     Multiple Vitamin (MULTIVITAMIN WITH MINERALS) TABS Take 1 tablet by mouth daily.     nitroGLYCERIN  (NITROSTAT ) 0.4 MG SL tablet PLACE 1 TABLET UNDER THE TONGUE EVERY 5 MINUTES AS NEEDED FOR CHEST PAIN. 25 tablet 8   Psyllium 30.9 % POWD Take by mouth 2 (two) times daily.     rosuvastatin  (CRESTOR ) 10 MG tablet Take 1 tablet (10 mg total) by mouth daily. 90 tablet 2   VASCEPA  1 g capsule TAKE 2 CAPSULES BY MOUTH 2 TIMES DAILY. 360 capsule 3   vitamin B-12 (CYANOCOBALAMIN ) 1000 MCG tablet Take 1,000 mcg by mouth daily.     vitamin C  (ASCORBIC ACID ) 500 MG tablet Take 500 mg by mouth  daily.     No current facility-administered medications for this visit.    REVIEW OF SYSTEMS (negative unless checked):   Cardiac:  []  Chest pain or chest pressure? []  Shortness of breath upon activity? []  Shortness of breath when lying flat? []  Irregular heart rhythm?  Vascular:  []  Pain in calf, thigh, or hip brought on by walking? []  Pain in feet at night that wakes you up from your sleep? []  Blood clot in your veins? []  Leg swelling?  Pulmonary:  []  Oxygen at home? []  Productive cough? []  Wheezing?  Neurologic:  []  Sudden weakness in arms or legs? []  Sudden numbness in arms or legs? []  Sudden onset of difficult speaking or slurred speech? []  Temporary loss of vision in one eye? []  Problems with dizziness?  Gastrointestinal:  []  Blood in stool? []  Vomited blood?  Genitourinary:  []  Burning when urinating? []  Blood in urine?  Psychiatric:  []  Major depression  Hematologic:  []  Bleeding problems? []  Problems with blood clotting?  Dermatologic:  []  Rashes or ulcers?  Constitutional:  []  Fever or chills?  Ear/Nose/Throat:  []  Change in hearing? []  Nose bleeds? []  Sore throat?  Musculoskeletal:  []  Back pain? []  Joint pain? []  Muscle pain?   Physical Examination    Vitals:   10/28/23 1045 10/28/23 1049  BP: (!) 161/95 (!) 133/91  Pulse: 70   Temp: (!) 97.4 F (36.3 C)   SpO2: 93%   Weight: 157 lb (71.2 kg)   Height: 5\' 2"  (1.575 m)    Body mass index is 28.72 kg/m.  General:  WDWN in NAD; vital signs documented above Gait: Not observed HENT: WNL, normocephalic Pulmonary: normal non-labored breathing , without rales, rhonchi,  wheezing Cardiac: regular Abdomen: soft, NT, no masses Skin: without rashes Vascular Exam/Pulses: palpable radial pulses bilaterally Extremities: without ischemic changes, without gangrene , without cellulitis; without open wounds;  Musculoskeletal: no muscle wasting or atrophy  Neurologic: A&O X 3;  No focal  weakness or paresthesias are detected Psychiatric:  The pt has Normal affect.  Non-Invasive Vascular Imaging   Bilateral Carotid Duplex (10/28/2023):  R ICA stenosis:  40-59% R VA:  patent and antegrade L ICA stenosis:  1-39% L VA:  patent and antegrade   Medical Decision Making   [name] is a 83 y.o. female who presents for surveillance of carotid artery stenosis  Based on the patient's vascular studies, her carotid artery stenosis is unchanged.  Her right ICA stenosis measures 40 to 59% by duplex today, however she has had several years of ultrasounds confirming 60 to 79%.  Her left ICA stenosis is unchanged at 1 to 39% She denies any strokelike symptoms such as slurred speech, facial droop, sudden visual changes, or sudden weakness/numbness.  She has palpable and equal radial pulses bilaterally.  She has no neurological deficits on exam She will continue to take her Plavix , Repatha , and rosuvastatin  daily.  She can follow-up with our office in 6 months with repeat carotid duplex   Deneise Finlay PA-C [facility] Office: [phone]  Clinic MD: Marcelino Sera

## 2023-10-31 ENCOUNTER — Other Ambulatory Visit: Payer: Self-pay | Admitting: *Deleted

## 2023-10-31 DIAGNOSIS — I6523 Occlusion and stenosis of bilateral carotid arteries: Secondary | ICD-10-CM

## 2023-11-05 DIAGNOSIS — E782 Mixed hyperlipidemia: Secondary | ICD-10-CM | POA: Diagnosis not present

## 2023-11-06 ENCOUNTER — Ambulatory Visit: Payer: Self-pay | Admitting: Pharmacist

## 2023-11-06 LAB — LIPID PANEL
Chol/HDL Ratio: 1.9 ratio (ref 0.0–4.4)
Cholesterol, Total: 103 mg/dL (ref 100–199)
HDL: 54 mg/dL (ref >39)
LDL Chol Calc (NIH): 28 mg/dL (ref 0–99)
Triglycerides: 114 mg/dL (ref 0–149)
VLDL Cholesterol Cal: 21 mg/dL (ref 5–40)

## 2023-12-29 DIAGNOSIS — Z961 Presence of intraocular lens: Secondary | ICD-10-CM | POA: Diagnosis not present

## 2023-12-29 DIAGNOSIS — H524 Presbyopia: Secondary | ICD-10-CM | POA: Diagnosis not present

## 2023-12-29 DIAGNOSIS — H2511 Age-related nuclear cataract, right eye: Secondary | ICD-10-CM | POA: Diagnosis not present

## 2023-12-31 DIAGNOSIS — L2989 Other pruritus: Secondary | ICD-10-CM | POA: Diagnosis not present

## 2023-12-31 DIAGNOSIS — L821 Other seborrheic keratosis: Secondary | ICD-10-CM | POA: Diagnosis not present

## 2023-12-31 DIAGNOSIS — Z85828 Personal history of other malignant neoplasm of skin: Secondary | ICD-10-CM | POA: Diagnosis not present

## 2023-12-31 DIAGNOSIS — I788 Other diseases of capillaries: Secondary | ICD-10-CM | POA: Diagnosis not present

## 2023-12-31 DIAGNOSIS — L718 Other rosacea: Secondary | ICD-10-CM | POA: Diagnosis not present

## 2023-12-31 DIAGNOSIS — L720 Epidermal cyst: Secondary | ICD-10-CM | POA: Diagnosis not present

## 2023-12-31 DIAGNOSIS — D1801 Hemangioma of skin and subcutaneous tissue: Secondary | ICD-10-CM | POA: Diagnosis not present

## 2023-12-31 DIAGNOSIS — L814 Other melanin hyperpigmentation: Secondary | ICD-10-CM | POA: Diagnosis not present

## 2024-01-08 ENCOUNTER — Other Ambulatory Visit: Payer: Self-pay | Admitting: Internal Medicine

## 2024-01-08 DIAGNOSIS — Z1231 Encounter for screening mammogram for malignant neoplasm of breast: Secondary | ICD-10-CM

## 2024-01-19 DIAGNOSIS — N1831 Chronic kidney disease, stage 3a: Secondary | ICD-10-CM | POA: Diagnosis not present

## 2024-01-19 DIAGNOSIS — I129 Hypertensive chronic kidney disease with stage 1 through stage 4 chronic kidney disease, or unspecified chronic kidney disease: Secondary | ICD-10-CM | POA: Diagnosis not present

## 2024-01-19 DIAGNOSIS — K219 Gastro-esophageal reflux disease without esophagitis: Secondary | ICD-10-CM | POA: Diagnosis not present

## 2024-01-19 DIAGNOSIS — M8580 Other specified disorders of bone density and structure, unspecified site: Secondary | ICD-10-CM | POA: Diagnosis not present

## 2024-01-19 DIAGNOSIS — E785 Hyperlipidemia, unspecified: Secondary | ICD-10-CM | POA: Diagnosis not present

## 2024-01-26 DIAGNOSIS — N1831 Chronic kidney disease, stage 3a: Secondary | ICD-10-CM | POA: Diagnosis not present

## 2024-01-26 DIAGNOSIS — R82998 Other abnormal findings in urine: Secondary | ICD-10-CM | POA: Diagnosis not present

## 2024-01-26 DIAGNOSIS — E785 Hyperlipidemia, unspecified: Secondary | ICD-10-CM | POA: Diagnosis not present

## 2024-01-26 DIAGNOSIS — I2510 Atherosclerotic heart disease of native coronary artery without angina pectoris: Secondary | ICD-10-CM | POA: Diagnosis not present

## 2024-01-26 DIAGNOSIS — Z1331 Encounter for screening for depression: Secondary | ICD-10-CM | POA: Diagnosis not present

## 2024-01-26 DIAGNOSIS — N3281 Overactive bladder: Secondary | ICD-10-CM | POA: Diagnosis not present

## 2024-01-26 DIAGNOSIS — T466X5A Adverse effect of antihyperlipidemic and antiarteriosclerotic drugs, initial encounter: Secondary | ICD-10-CM | POA: Diagnosis not present

## 2024-01-26 DIAGNOSIS — I1310 Hypertensive heart and chronic kidney disease without heart failure, with stage 1 through stage 4 chronic kidney disease, or unspecified chronic kidney disease: Secondary | ICD-10-CM | POA: Diagnosis not present

## 2024-01-26 DIAGNOSIS — Z0000 Encounter for general adult medical examination without abnormal findings: Secondary | ICD-10-CM | POA: Diagnosis not present

## 2024-01-26 DIAGNOSIS — M791 Myalgia, unspecified site: Secondary | ICD-10-CM | POA: Diagnosis not present

## 2024-01-26 DIAGNOSIS — Z1389 Encounter for screening for other disorder: Secondary | ICD-10-CM | POA: Diagnosis not present

## 2024-01-26 DIAGNOSIS — M1990 Unspecified osteoarthritis, unspecified site: Secondary | ICD-10-CM | POA: Diagnosis not present

## 2024-02-20 ENCOUNTER — Other Ambulatory Visit: Payer: Self-pay

## 2024-02-20 ENCOUNTER — Emergency Department (HOSPITAL_BASED_OUTPATIENT_CLINIC_OR_DEPARTMENT_OTHER)

## 2024-02-20 ENCOUNTER — Telehealth: Payer: Self-pay | Admitting: Cardiovascular Disease

## 2024-02-20 ENCOUNTER — Ambulatory Visit: Admitting: Internal Medicine

## 2024-02-20 ENCOUNTER — Observation Stay (HOSPITAL_BASED_OUTPATIENT_CLINIC_OR_DEPARTMENT_OTHER)
Admission: EM | Admit: 2024-02-20 | Discharge: 2024-02-21 | Disposition: A | Discharge status: Home / Self Care | Source: Ambulatory Visit | Attending: Internal Medicine | Admitting: Internal Medicine

## 2024-02-20 ENCOUNTER — Encounter (HOSPITAL_COMMUNITY): Payer: Self-pay | Admitting: Internal Medicine

## 2024-02-20 ENCOUNTER — Observation Stay (HOSPITAL_COMMUNITY)

## 2024-02-20 DIAGNOSIS — I25119 Atherosclerotic heart disease of native coronary artery with unspecified angina pectoris: Secondary | ICD-10-CM | POA: Diagnosis not present

## 2024-02-20 DIAGNOSIS — I739 Peripheral vascular disease, unspecified: Secondary | ICD-10-CM | POA: Insufficient documentation

## 2024-02-20 DIAGNOSIS — Z79899 Other long term (current) drug therapy: Secondary | ICD-10-CM | POA: Insufficient documentation

## 2024-02-20 DIAGNOSIS — G4733 Obstructive sleep apnea (adult) (pediatric): Secondary | ICD-10-CM | POA: Diagnosis not present

## 2024-02-20 DIAGNOSIS — Z7982 Long term (current) use of aspirin: Secondary | ICD-10-CM | POA: Insufficient documentation

## 2024-02-20 DIAGNOSIS — Z7901 Long term (current) use of anticoagulants: Secondary | ICD-10-CM | POA: Diagnosis not present

## 2024-02-20 DIAGNOSIS — R079 Chest pain, unspecified: Principal | ICD-10-CM

## 2024-02-20 DIAGNOSIS — M79629 Pain in unspecified upper arm: Secondary | ICD-10-CM | POA: Diagnosis not present

## 2024-02-20 DIAGNOSIS — I2089 Other forms of angina pectoris: Secondary | ICD-10-CM | POA: Insufficient documentation

## 2024-02-20 DIAGNOSIS — R0789 Other chest pain: Secondary | ICD-10-CM | POA: Diagnosis not present

## 2024-02-20 DIAGNOSIS — I10 Essential (primary) hypertension: Secondary | ICD-10-CM | POA: Diagnosis not present

## 2024-02-20 DIAGNOSIS — M25519 Pain in unspecified shoulder: Secondary | ICD-10-CM | POA: Diagnosis not present

## 2024-02-20 DIAGNOSIS — F1090 Alcohol use, unspecified, uncomplicated: Secondary | ICD-10-CM | POA: Diagnosis not present

## 2024-02-20 DIAGNOSIS — E785 Hyperlipidemia, unspecified: Secondary | ICD-10-CM | POA: Insufficient documentation

## 2024-02-20 DIAGNOSIS — I771 Stricture of artery: Secondary | ICD-10-CM | POA: Diagnosis not present

## 2024-02-20 DIAGNOSIS — I2510 Atherosclerotic heart disease of native coronary artery without angina pectoris: Secondary | ICD-10-CM | POA: Diagnosis present

## 2024-02-20 DIAGNOSIS — Z955 Presence of coronary angioplasty implant and graft: Secondary | ICD-10-CM | POA: Insufficient documentation

## 2024-02-20 DIAGNOSIS — M25512 Pain in left shoulder: Secondary | ICD-10-CM | POA: Diagnosis not present

## 2024-02-20 DIAGNOSIS — Z743 Need for continuous supervision: Secondary | ICD-10-CM | POA: Diagnosis not present

## 2024-02-20 LAB — TROPONIN T, HIGH SENSITIVITY
Troponin T High Sensitivity: 22 ng/L — ABNORMAL HIGH (ref 0–19)
Troponin T High Sensitivity: 21 ng/L — ABNORMAL HIGH (ref 0–19)

## 2024-02-20 LAB — CBC
HCT: 41.4 % (ref 36.0–46.0)
Hemoglobin: 13.6 g/dL (ref 12.0–15.0)
MCH: 30.2 pg (ref 26.0–34.0)
MCHC: 32.9 g/dL (ref 30.0–36.0)
MCV: 91.8 fL (ref 80.0–100.0)
Platelets: 340 K/uL (ref 150–400)
RBC: 4.51 MIL/uL (ref 3.87–5.11)
RDW: 13 % (ref 11.5–15.5)
WBC: 11.6 K/uL — ABNORMAL HIGH (ref 4.0–10.5)
nRBC: 0 % (ref 0.0–0.2)

## 2024-02-20 LAB — HEPATIC FUNCTION PANEL
ALT: 18 U/L (ref 0–44)
AST: 30 U/L (ref 15–41)
Albumin: 4.3 g/dL (ref 3.5–5.0)
Alkaline Phosphatase: 82 U/L (ref 38–126)
Bilirubin, Direct: 0.2 mg/dL (ref 0.0–0.2)
Indirect Bilirubin: 0.2 mg/dL — ABNORMAL LOW (ref 0.3–0.9)
Total Bilirubin: 0.4 mg/dL (ref 0.0–1.2)
Total Protein: 6.6 g/dL (ref 6.5–8.1)

## 2024-02-20 LAB — BASIC METABOLIC PANEL WITH GFR
Anion gap: 13 (ref 5–15)
BUN: 11 mg/dL (ref 8–23)
CO2: 22 mmol/L (ref 22–32)
Calcium: 9.6 mg/dL (ref 8.9–10.3)
Chloride: 104 mmol/L (ref 98–111)
Creatinine, Ser: 0.88 mg/dL (ref 0.44–1.00)
GFR, Estimated: >60 mL/min (ref >60)
Glucose, Bld: 93 mg/dL (ref 70–99)
Potassium: 4.1 mmol/L (ref 3.5–5.1)
Sodium: 139 mmol/L (ref 135–145)

## 2024-02-20 LAB — D-DIMER, QUANTITATIVE: D-Dimer, Quant: 0.45 ug{FEU}/mL (ref 0.00–0.50)

## 2024-02-20 MED ORDER — MELATONIN 3 MG PO TABS: 6.0000 mg | ORAL_TABLET | Freq: Every evening | ORAL | Status: DC | PRN

## 2024-02-20 MED ORDER — SODIUM CHLORIDE 0.9% FLUSH
3.0000 mL | Freq: Two times a day (BID) | INTRAVENOUS | Status: DC
  Administered 2024-02-20 – 2024-02-21 (×2): 3 mL via INTRAVENOUS

## 2024-02-20 MED ORDER — ALBUTEROL SULFATE (2.5 MG/3ML) 0.083% IN NEBU: 2.5000 mg | INHALATION_SOLUTION | RESPIRATORY_TRACT | Status: DC | PRN

## 2024-02-20 MED ORDER — ORAL CARE MOUTH RINSE: 15.0000 mL | OROMUCOSAL | Status: DC | PRN

## 2024-02-20 MED ORDER — POLYETHYLENE GLYCOL 3350 17 G PO PACK: 17.0000 g | PACK | Freq: Every day | ORAL | Status: DC | PRN

## 2024-02-20 MED ORDER — ACETAMINOPHEN 500 MG PO TABS
1000.0000 mg | ORAL_TABLET | Freq: Four times a day (QID) | ORAL | Status: DC | PRN
  Administered 2024-02-21: 1000 mg via ORAL
  Filled 2024-02-20: qty 2

## 2024-02-20 MED ORDER — AMLODIPINE BESYLATE 5 MG PO TABS
2.5000 mg | ORAL_TABLET | Freq: Every day | ORAL | Status: DC
  Filled 2024-02-20: qty 1

## 2024-02-20 MED ORDER — ASPIRIN 81 MG PO TBEC
81.0000 mg | DELAYED_RELEASE_TABLET | Freq: Every day | ORAL | Status: DC
  Filled 2024-02-20: qty 1

## 2024-02-20 MED ORDER — ICOSAPENT ETHYL 1 G PO CAPS
2.0000 g | ORAL_CAPSULE | Freq: Two times a day (BID) | ORAL | Status: DC
  Filled 2024-02-20: qty 2

## 2024-02-20 MED ORDER — ROSUVASTATIN CALCIUM 5 MG PO TABS
10.0000 mg | ORAL_TABLET | Freq: Every day | ORAL | Status: DC
  Filled 2024-02-20: qty 2

## 2024-02-20 MED ORDER — LEVOCETIRIZINE DIHYDROCHLORIDE 5 MG PO TABS: 5.0000 mg | ORAL_TABLET | Freq: Every day | ORAL | Status: DC | PRN

## 2024-02-20 MED ORDER — HEPARIN SODIUM (PORCINE) 5000 UNIT/ML IJ SOLN: 5000.0000 [IU] | Freq: Three times a day (TID) | INTRAMUSCULAR | Status: DC

## 2024-02-20 MED ORDER — LORATADINE 10 MG PO TABS: 10.0000 mg | ORAL_TABLET | Freq: Every day | ORAL | Status: DC | PRN

## 2024-02-20 MED ORDER — CLOPIDOGREL BISULFATE 75 MG PO TABS
75.0000 mg | ORAL_TABLET | Freq: Every day | ORAL | Status: DC
  Administered 2024-02-21: 75 mg via ORAL
  Filled 2024-02-20: qty 1

## 2024-02-20 MED ORDER — ONDANSETRON HCL 4 MG/2ML IJ SOLN: 4.0000 mg | Freq: Four times a day (QID) | INTRAMUSCULAR | Status: DC | PRN

## 2024-02-20 MED ORDER — ASPIRIN 81 MG PO CHEW
324.0000 mg | CHEWABLE_TABLET | Freq: Once | ORAL | Status: AC
  Administered 2024-02-20: 324 mg via ORAL
  Filled 2024-02-20: qty 4

## 2024-02-20 NOTE — ED Notes (Signed)
 Called [facility] to transport the patient [name] 5W rm# [id number]

## 2024-02-20 NOTE — ED Provider Notes (Signed)
 [place] EMERGENCY DEPARTMENT AT [facility] Provider Note   CSN: [id number] Arrival date & time: 02/20/24  0930     Patient presents with: No chief complaint on file.   [name] is a 83 y.o. female.   The history is provided by the patient and medical records. No language interpreter was used.     83 year old female history of CAD with prior stenting currently on Plavix  and statin, hypertension, hyperlipidemia, carotid artery disease presenting today with complaints of shoulder pain.  Patient states for the past week she has noticed reoccurring discomfort to her left shoulder and upper arm especially with exertion when she walks up an incline.  States sometimes she would have some intermittent nausea and an another time she would have some sensation of swelling and feeling mild short of breath.  She does not endorse any associated chest pain but states that the sensation that she is currently experiencing felt similar to her prior heart blockage that required stenting in the past.  Patient voiced concerns about her heart.  She denies any injury to her left shoulder.  She is right arm dominant.  She denies any focal numbness or focal weakness but states for the past several weeks she has felt a bit more tired than usual.  No headache or neck pain.  Prior to Admission medications   Medication Sig Start Date End Date Taking? Authorizing Provider  amLODipine  (NORVASC ) 2.5 MG tablet TAKE 1 TABLET BY MOUTH EVERY DAY 06/02/19   Verlin Lonni BIRCH, MD  cholecalciferol  (VITAMIN D) 1000 UNITS tablet Take 1,000 Units by mouth daily.    [provider]  clopidogrel  (PLAVIX ) 75 MG tablet TAKE 1 TABLET BY MOUTH EVERY DAY 09/01/23   Verlin Lonni BIRCH, MD  Coenzyme Q10 (COQ10 PO) Take 1 capsule by mouth daily.    [provider]  EPINEPHrine 0.3 mg/0.3 mL IJ SOAJ injection See admin instructions. 08/20/19   [provider]  Evolocumab  (REPATHA   SURECLICK) 140 MG/ML SOAJ Inject 140 mg into the skin every 14 (fourteen) days. 06/30/23   Verlin Lonni BIRCH, MD  fluticasone (FLONASE) 50 MCG/ACT nasal spray Place 1 spray into both nostrils as needed for allergies.  06/02/14   [provider]  levocetirizine (XYZAL ) 5 MG tablet Take 1 tablet by mouth daily. 09/28/19   [provider]  Multiple Vitamin (MULTIVITAMIN WITH MINERALS) TABS Take 1 tablet by mouth daily.    [provider]  nitroGLYCERIN  (NITROSTAT ) 0.4 MG SL tablet PLACE 1 TABLET UNDER THE TONGUE EVERY 5 MINUTES AS NEEDED FOR CHEST PAIN. 07/25/22   Verlin Lonni BIRCH, MD  Psyllium 30.9 % POWD Take by mouth 2 (two) times daily.    [provider]  rosuvastatin  (CRESTOR ) 10 MG tablet Take 1 tablet (10 mg total) by mouth daily. 08/01/23   Verlin Lonni BIRCH, MD  VASCEPA  1 g capsule TAKE 2 CAPSULES BY MOUTH 2 TIMES DAILY. 05/12/23   Verlin Lonni BIRCH, MD  vitamin B-12 (CYANOCOBALAMIN ) 1000 MCG tablet Take 1,000 mcg by mouth daily.    [provider]  vitamin C  (ASCORBIC ACID ) 500 MG tablet Take 500 mg by mouth daily.    [provider]    Allergies: Other, Fluoride preparations, and Rosuvastatin     Review of Systems  All other systems reviewed and are negative.   Updated Vital Signs BP (!) 152/71 (BP Location: Right Arm)   Pulse 67   Temp 97.8 F (36.6 C)   Resp 16  SpO2 99%   Physical Exam Vitals and nursing note reviewed.  Constitutional:      General: She is not in acute distress.    Appearance: She is well-developed.  HENT:     Head: Normocephalic and atraumatic.  Eyes:     Conjunctiva/sclera: Conjunctivae normal.  Cardiovascular:     Rate and Rhythm: Normal rate and regular rhythm.     Pulses: Normal pulses.     Heart sounds: Normal heart sounds.  Pulmonary:     Effort: Pulmonary effort is normal.     Breath sounds: No wheezing, rhonchi or rales.  Chest:     Chest wall: No tenderness.   Abdominal:     Palpations: Abdomen is soft.     Tenderness: There is no abdominal tenderness.  Musculoskeletal:        General: No tenderness (Palpation of the left arm and shoulder without any reproducible tenderness.  Shoulder with full range of motion.  Radial pulse 2+.).     Cervical back: Normal range of motion and neck supple.     Right lower leg: No edema.     Left lower leg: No edema.     Comments: No cervical midline spine tenderness  Skin:    Findings: No rash.  Neurological:     Mental Status: She is alert and oriented to person, place, and time.  Psychiatric:        Mood and Affect: Mood normal.     (all labs ordered are listed, but only abnormal results are displayed) Labs Reviewed  CBC - Abnormal; Notable for the following components:      Result Value   WBC 11.6 (*)    All other components within normal limits  TROPONIN T, HIGH SENSITIVITY - Abnormal; Notable for the following components:   Troponin T High Sensitivity 22 (*)    All other components within normal limits  TROPONIN T, HIGH SENSITIVITY - Abnormal; Notable for the following components:   Troponin T High Sensitivity 21 (*)    All other components within normal limits  BASIC METABOLIC PANEL WITH GFR  D-DIMER, QUANTITATIVE  HEPATIC FUNCTION PANEL    EKG: EKG Interpretation Date/Time:  Friday February 20 2024 09:52:50 EDT Ventricular Rate:  70 PR Interval:  195 QRS Duration:  92 QT Interval:  427 QTC Calculation: 461 R Axis:   7  Text Interpretation: Sinus rhythm Low voltage, precordial leads Abnormal R-wave progression, early transition Consider anterior infarct Confirmed by Mannie Pac [phone]) on 02/20/2024 11:05:43 AM  Radiology: [facility] Chest Port 1 View Result Date: 02/20/2024 CLINICAL DATA:  Left chest, shoulder and upper arm pain. EXAM: PORTABLE CHEST 1 VIEW COMPARISON:  12/27/2012. FINDINGS: Normal-sized heart. Tortuous and partially calcified thoracic aorta. Clear lungs with normal  vascularity. Cervical spine degenerative changes. IMPRESSION: No acute abnormality. Electronically Signed   By: Elspeth Bathe M.D.   On: 02/20/2024 10:29     Procedures   Medications Ordered in the ED  aspirin  chewable tablet 324 mg (324 mg Oral Given 02/20/24 1109)                                    Medical Decision Making Amount and/or Complexity of Data Reviewed Labs: ordered. Radiology: ordered.  Risk OTC drugs.   BP (!) 152/71 (BP Location: Right Arm)   Pulse 67   Temp 97.8 F (36.6 C)   Resp 16   SpO2 99%  68:66 AM 83 year old female history of CAD with prior stenting currently on Plavix  and statin, hypertension, hyperlipidemia, carotid artery disease presenting today with complaints of shoulder pain.  Patient states for the past week she has noticed reoccurring discomfort to her left shoulder and upper arm especially with exertion when she walks up an incline.  States sometimes she would have some intermittent nausea and an another time she would have some sensation of swelling and feeling mild short of breath.  She does not endorse any associated chest pain but states that the sensation that she is currently experiencing felt similar to her prior heart blockage that required stenting in the past.  Patient voiced concerns about her heart.  She denies any injury to her left shoulder.  She is right arm dominant.  She denies any focal numbness or focal weakness but states for the past several weeks she has felt a bit more tired than usual.  No headache or neck pain.  On exam patient is resting comfortably appears to be in no acute discomfort.  She does not have any reproducible tenderness about her left shoulder or left arm.  She is neurovascular intact, strength equal throughout, heart and lung sounds normal  Given her concerns of shoulder pain that felt very similar to prior cardiac event that she has had in the past requiring cardiac stenting, cardiac workup have been  initiated.  No active chest pain.  -Labs ordered, independently viewed and interpreted by me.  Labs remarkable for initial troponin is mildly elevated at 22, will obtain delta troponin, will give aspirin .  White count minimally elevated 11.6. -The patient was maintained on a cardiac monitor.  I personally viewed and interpreted the cardiac monitored which showed an underlying rhythm of: Normal sinus rhythm -Imaging independently viewed and interpreted by me and I agree with radiologist's interpretation.  Result remarkable for x-ray of the chest without any acute abnormality -This patient presents to the ED for concern of arm pain, this involves an extensive number of treatment options, and is a complaint that carries with it a high risk of complications and morbidity.  The differential diagnosis includes ACS, MSK, radiculopathy, shingles, strain, sprain, fracture, dislocation -Co morbidities that complicate the patient evaluation includes CAD with drug-eluting stent -Treatment includes ASA -Reevaluation of the patient after these medicines showed that the patient improved -PCP office notes or outside notes reviewed -Discussion with attending Dr. Mannie.  I have also consulted with [facility] Dr. Tobie who agrees to admit pt for unstable angina.  She request d-dimer and LFT labs as well.  Heparin  considered but not given -Escalation to admission/observation considered: patient agreeable with hospital admission.       Final diagnoses:  Nonspecific chest pain    ED Discharge Orders     None          Nivia Colon, PA-C 02/20/24 1437    Mannie Pac T, DO 02/24/24 [phone]

## 2024-02-20 NOTE — Telephone Encounter (Signed)
 Pts having left shoulder pain and pain under her arm but no other symptoms.

## 2024-02-20 NOTE — Consult Note (Signed)
 Cardiology Consultation   Patient ID: [name] MRN: [id number]; DOB: August 30, 1940  Admit date: 02/20/2024 Date of Consult: 02/20/2024  PCP:  [name], MD   [facility] Providers Cardiologist:  [name], MD        Patient Profile: [name] is a 83 y.o. female with a hx of polyvascular bed atherosclerosis who is being seen 02/20/2024 for the evaluation of unstable angina at the request of the hospitalist team.  History of Present Illness: [name] presents w/ L shoulder discomfort/pressure that radiates into her L back shoulder blade, not into the chest.  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  Never occurs with rest.  Usually shortlived when it starts, quickly with stop walking or arms down.  She has not tried any nitroglycerin  recently for this symptom. She remembers also having shoulder pain preceding her PCI in 2008  Last echo 2023 showed preserved EF without wall motion abnormality.  Last stress in 2023 she went 5 and half minutes on Bruce yielding 7 mets, with normal nuclear findings.  Per cardiology note, h/o LAD, diagonal and RCA stents.  She takes clopidogrel  monotherapy without ASA at home.   Past Medical History:  Diagnosis Date   Allergy    Arthritis    CAD (coronary artery disease)    a. DES to LAD, diagonal 10/2006. b. DES to RCA 05/2007. c. Last cath 11/2012: patent stents, stable dz. d. Lexiscan  cardiolite  negative 12/2012.   Cataract    left eye   Diverticulitis    Diverticulosis of colon    GERD (gastroesophageal reflux disease)    Hemorrhoids    History of recurrent UTIs    Hx of adenomatous colonic polyps    tubular   Hyperlipidemia    Hypertension    Peripheral vascular disease (HCC)    Sleep apnea    wears CPAP- 03/07/2020 no longer wears CPAP    Past Surgical History:  Procedure Laterality Date   ANGIOPLASTY  5/08  and  12/08    stent x3    Cataract left     COLONOSCOPY     EYE SURGERY Left    laser eye surgery for blood leakage   FOOT TENDON SURGERY Left Sept. 20, 2016    Left Calf / foot -   Dr. Harden   MASS EXCISION Right 07/13/2019   Procedure: EXCISION VOLAR CYST RIGHT WRIST;  Surgeon: Murrell Kuba, MD;  Location: [facility];  Service: Orthopedics;  Laterality: Right;  IV REGIONAL FOREARM BLOCK   Right shoulder surgery     Vitrectomy left eye         Scheduled Meds:  [START ON 02/21/2024] amLODipine   2.5 mg Oral Daily   [START ON 02/21/2024] aspirin  EC  81 mg Oral Daily   [START ON 02/21/2024] clopidogrel   75 mg Oral Daily   [START ON 02/21/2024] icosapent  Ethyl  2 g Oral BID   [START ON 02/21/2024] rosuvastatin   10 mg Oral Daily   sodium chloride  flush  3 mL Intravenous Q12H   Continuous Infusions:  PRN Meds: acetaminophen , albuterol , loratadine , melatonin, ondansetron  (ZOFRAN ) IV, mouth rinse, polyethylene glycol  Allergies:    Allergies  Allergen Reactions   Other Hives, Itching and Swelling    VICRYL STITCHES Tongue swelling Other reaction(s): Unknown   Fluoride Preparations Other (See Comments)    A test for eye to make pictures, (  causing  pressure of Left arm )   Rosuvastatin  Other (See Comments)    Leg pains, cramping    Social History:   Social History   Socioeconomic History   Marital status: Married    Spouse name: Not on file   Number of children: 0   Years of education: Not on file   Highest education level: Not on file  Occupational History   Occupation: retired     Associate Professor: RETIRED  Tobacco Use   Smoking status: Never   Smokeless tobacco: Never  Vaping Use   Vaping status: Never Used  Substance and Sexual Activity   Alcohol  use: Yes    Alcohol /week: 2.0 - 3.0 standard drinks of alcohol     Types: 2 - 3 Standard drinks or equivalent per week    Comment: 1 per day   Drug use: No   Sexual activity: Not on file  Other Topics Concern   Not on file  Social History  Narrative   Not on file   Social Drivers of Health   Financial Resource Strain: Not on file  Food Insecurity: No Food Insecurity (02/20/2024)   Hunger Vital Sign    Worried About Running Out of Food in the Last Year: Never true    Ran Out of Food in the Last Year: Never true  Transportation Needs: No Transportation Needs (02/20/2024)   PRAPARE - Administrator, Civil Service (Medical): No    Lack of Transportation (Non-Medical): No  Physical Activity: Not on file  Stress: Not on file  Social Connections: Patient Declined (02/20/2024)   Social Connection and Isolation Panel    Frequency of Communication with Friends and Family: Patient declined    Frequency of Social Gatherings with Friends and Family: Patient declined    Attends Religious Services: Patient declined    Database administrator or Organizations: Patient declined    Attends Banker Meetings: Patient declined    Marital Status: Patient declined  Intimate Partner Violence: Not At Risk (02/20/2024)   Humiliation, Afraid, Rape, and Kick questionnaire    Fear of Current or Ex-Partner: No    Emotionally Abused: No    Physically Abused: No    Sexually Abused: No    Family History:    Family History  Problem Relation Age of Onset   Colon polyps Brother    Heart disease Brother        before age 69   Hyperlipidemia Brother    Prostate cancer Brother    Heart disease Father        before age 32   Deep vein thrombosis Father    Hyperlipidemia Father    Heart attack Father 50   Stroke Father    Heart disease Brother    Heart attack Brother    Cancer Brother        Merkle cell   Stroke Mother        TIA's   Dementia Mother    Ovarian cancer Maternal Grandmother    Colon polyps Paternal Grandfather    Liver cancer Paternal Grandfather    Heart disease Other        Grandmother    Colon cancer Neg Hx    Esophageal cancer Neg Hx    Stomach cancer Neg Hx    Rectal cancer Neg Hx      ROS:   Please see the history of present illness.   All other ROS reviewed and negative.     Physical  Exam/Data: Vitals:   02/20/24 1339 02/20/24 1715 02/20/24 1915 02/20/24 1958  BP:  (!) 157/72 (!) 153/71   Pulse:  66 85   Resp:  15 16 18   Temp: 98 F (36.7 C)  98 F (36.7 C)   TempSrc: Oral  Oral   SpO2:  95% 96% 96%  Weight:    69.5 kg  Height:   5' 2 (1.575 m)    No intake or output data in the 24 hours ending 02/20/24 2154    02/20/2024    7:58 PM 10/28/2023   10:45 AM 04/01/2023   12:42 PM  Last 3 Weights  Weight (lbs) 153 lb 3.2 oz 157 lb 156 lb 1.6 oz  Weight (kg) 69.491 kg 71.215 kg 70.806 kg     Body mass index is 28.02 kg/m.  General:  Well nourished, well developed, in no acute distress HEENT: normal Neck: no JVD Vascular: No carotid bruits; Distal pulses 2+ bilaterally Cardiac:  normal S1, S2; RRR; no murmur  Lungs:  clear to auscultation bilaterally, no wheezing, rhonchi or rales  Abd: soft, nontender, no hepatomegaly  Ext: no edema Musculoskeletal:  No deformities, BUE and BLE strength normal and equal Skin: warm and dry  Neuro:  CNs 2-12 intact, no focal abnormalities noted Psych:  Normal affect   EKG:  The EKG was personally reviewed and demonstrates:  sinus rhythm without ST changes Telemetry:  Telemetry was personally reviewed and demonstrates:  no major events    Laboratory Data: Troponins 21 and 22  Chemistry Recent Labs  Lab 02/20/24 1001  NA 139  K 4.1  CL 104  CO2 22  GLUCOSE 93  BUN 11  CREATININE 0.88  CALCIUM  9.6  GFRNONAA >60  ANIONGAP 13    Recent Labs  Lab 02/20/24 1156  PROT 6.6  ALBUMIN 4.3  AST 30  ALT 18  ALKPHOS 82  BILITOT 0.4   Hematology Recent Labs  Lab 02/20/24 1001  WBC 11.6*  RBC 4.51  HGB 13.6  HCT 41.4  MCV 91.8  MCH 30.2  MCHC 32.9  RDW 13.0  PLT 340   Thyroid No results for input(s): TSH, FREET4 in the last 168 hours.  BNPNo results for input(s): BNP, PROBNP in the last 168  hours.  DDimer  Recent Labs  Lab 02/20/24 1156  DDIMER 0.45    Radiology/Studies:  DG Shoulder Left Port Result Date: 02/20/2024 EXAM: 1 VIEW XRAY OF THE LEFT SHOULDER 02/20/2024 08:18:00 PM COMPARISON: None available. CLINICAL HISTORY: Left shoulder pain for a few days. FINDINGS: BONES AND JOINTS: Glenohumeral joint is normally aligned. No acute fracture or dislocation. The [facility] joint is unremarkable in appearance. SOFT TISSUES: No abnormal calcifications. Visualized lung is unremarkable. IMPRESSION: 1. No significant abnormality. Electronically signed by: Norman Gatlin MD 02/20/2024 08:22 PM EDT RP Workstation: [id number]   DG Chest Port 1 View Result Date: 02/20/2024 CLINICAL DATA:  Left chest, shoulder and upper arm pain. EXAM: PORTABLE CHEST 1 VIEW COMPARISON:  12/27/2012. FINDINGS: Normal-sized heart. Tortuous and partially calcified thoracic aorta. Clear lungs with normal vascularity. Cervical spine degenerative changes. IMPRESSION: No acute abnormality. Electronically Signed   By: Elspeth Bathe M.D.   On: 02/20/2024 10:29     Assessment and Plan: Shoulder discomfort in a patient with strong vascular atherosclerotic history.  Currently pain free, pain only with exertion.  Troponins negative.    Agree with overnight observation. No need for heparinization right now   Discussed with patient that if she is event  free overnight, we may be able to discharge her tomorrow with outpatient follow-up and a fresh new prescription for prn nitroglycerin .  Will defer to her primary cardiologist regarding further evaluation, including need for cardiac cath given increased frequency of symptoms, but assuming she does well overnight, she doesn't need to stay until next week to get this done.  Patient agrees and prefers to go home.   Risk Assessment/Risk Scores:    TIMI Risk Score for Unstable Angina or Non-ST Elevation MI:   The patient's TIMI risk score is 3, which indicates a 13% risk of all cause  mortality, new or recurrent myocardial infarction or need for urgent revascularization in the next 14 days.         For questions or updates, please contact  [facility] Please consult [email or web address] for contact info under    Signed, Andee Flatten, MD  02/20/2024 9:54 PM

## 2024-02-20 NOTE — Care Plan (Signed)
 Patient arrived to the unit AAOX 4 Complaining of pain 0/10 No signs/symptoms of distress Skin assessment done with Vernell, RN  Patient called husband to let him know she had arrived  [place], [place]

## 2024-02-20 NOTE — Progress Notes (Signed)
 Admit: Please call [facility] [phone] re:  [name] @ [facility].  MRN  [id number].  Chest pain rule out.  H/o cad on plavix .  Left shoulder worse with exertion.   Pt accepted to med tele for chest pain / unstable angina.

## 2024-02-20 NOTE — Plan of Care (Signed)
 Patient arrived to [facility] at approx. 1855 hours. Nursing handoff received from [place], [place]. This RN reached out to Dr. Keturah and Dr. Marcene for admission orders via secure chat at 1911 hours (see new orders). Patient's admission profile completed by this RN. 12-lead EKG obtained per orders and placed on the patient's chart.   Problem: Education: Goal: Knowledge of General Education information will improve Description: Including pain rating scale, medication(s)/side effects and non-pharmacologic comfort measures Outcome: Progressing   Problem: Health Behavior/Discharge Planning: Goal: Ability to manage health-related needs will improve Outcome: Progressing   Problem: Clinical Measurements: Goal: Ability to maintain clinical measurements within normal limits will improve Outcome: Progressing Goal: Will remain free from infection Outcome: Progressing Goal: Diagnostic test results will improve Outcome: Progressing Goal: Respiratory complications will improve Outcome: Progressing Goal: Cardiovascular complication will be avoided Outcome: Progressing   Problem: Activity: Goal: Risk for activity intolerance will decrease Outcome: Progressing   Problem: Nutrition: Goal: Adequate nutrition will be maintained Outcome: Progressing   Problem: Coping: Goal: Level of anxiety will decrease Outcome: Progressing   Problem: Elimination: Goal: Will not experience complications related to bowel motility Outcome: Progressing Goal: Will not experience complications related to urinary retention Outcome: Progressing   Problem: Pain Managment: Goal: General experience of comfort will improve and/or be controlled Outcome: Progressing   Problem: Safety: Goal: Ability to remain free from injury will improve Outcome: Progressing   Problem: Skin Integrity: Goal: Risk for impaired skin integrity will decrease Outcome: Progressing

## 2024-02-20 NOTE — H&P (Addendum)
 History and Physical    [name] FMW:[id number] DOB: 02/18/1941 DOA: 02/20/2024  PCP: [name], MD   Patient coming from: Tx from [facility] ED   Chief Complaint:  Chief Complaint  Patient presents with   Shoulder Pain    HPI:  [name] is a 83 y.o. female with hx of CAD with PCI, carotid artery disease, PAD, HTN, HLD, OSA, who presented with left shoulder pain. Reports that she had similar symptoms prior to her PCIs in '08. Over past month has again developed exertional L shoulder and left upper arm pain. This typically occurs when she goes for a walk x 2 times daily. However, over past couple weeks has escalated in frequency. Now happening every day. Reports that symptoms now begin after ~ 100 yards of walking. 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 Resolve with rest. Took NTG on one occasion which did not help. She has seen an orthopedist for her shoulder who thought was not related per patient report. She takes plavix  daily, no longer taking aspirin . No hx of falls, syncope, no issues with bleeding.    Review of Systems:  ROS complete and negative except as marked above   Allergies  Allergen Reactions   Other Hives, Itching and Swelling    VICRYL STITCHES Tongue swelling Other reaction(s): Unknown   Fluoride Preparations Other (See Comments)    A test for eye to make pictures, (  causing pressure of Left arm )   Rosuvastatin  Other (See Comments)    Leg pains, cramping    Prior to Admission medications   Medication Sig Start Date End Date Taking? Authorizing Provider  GEMTESA 75 MG TABS Take 75 mg by mouth daily. 01/26/24  Yes [provider]  amLODipine  (NORVASC ) 2.5 MG tablet TAKE 1 TABLET BY MOUTH EVERY DAY 06/02/19   Verlin Lonni BIRCH, MD  cholecalciferol  (VITAMIN D) 1000 UNITS tablet Take 1,000 Units by mouth daily.    [provider]  clopidogrel  (PLAVIX ) 75 MG tablet TAKE 1 TABLET BY MOUTH EVERY DAY 09/01/23    Verlin Lonni BIRCH, MD  Coenzyme Q10 (COQ10 PO) Take 1 capsule by mouth daily.    [provider]  EPINEPHrine 0.3 mg/0.3 mL IJ SOAJ injection See admin instructions. 08/20/19   [provider]  Evolocumab  (REPATHA  SURECLICK) 140 MG/ML SOAJ Inject 140 mg into the skin every 14 (fourteen) days. 06/30/23   Verlin Lonni BIRCH, MD  fluticasone (FLONASE) 50 MCG/ACT nasal spray Place 1 spray into both nostrils as needed for allergies.  06/02/14   [provider]  levocetirizine (XYZAL ) 5 MG tablet Take 1 tablet by mouth daily. 09/28/19   [provider]  Multiple Vitamin (MULTIVITAMIN WITH MINERALS) TABS Take 1 tablet by mouth daily.    [provider]  nitroGLYCERIN  (NITROSTAT ) 0.4 MG SL tablet PLACE 1 TABLET UNDER THE TONGUE EVERY 5 MINUTES AS NEEDED FOR CHEST PAIN. 07/25/22   Verlin Lonni BIRCH, MD  Psyllium 30.9 % POWD Take by mouth 2 (two) times daily.    [provider]  rosuvastatin  (CRESTOR ) 10 MG tablet Take 1 tablet (10 mg total) by mouth daily. 08/01/23   Verlin Lonni BIRCH, MD  VASCEPA  1 g capsule TAKE 2 CAPSULES BY MOUTH 2 TIMES DAILY. 05/12/23   Verlin Lonni BIRCH, MD  vitamin B-12 (CYANOCOBALAMIN ) 1000 MCG tablet Take 1,000 mcg by mouth daily.    [provider]  vitamin C  (ASCORBIC ACID ) 500 MG tablet Take 500  mg by mouth daily.    [provider]    Past Medical History:  Diagnosis Date   Allergy    Arthritis    CAD (coronary artery disease)    a. DES to LAD, diagonal 10/2006. b. DES to RCA 05/2007. c. Last cath 11/2012: patent stents, stable dz. d. Lexiscan  cardiolite  negative 12/2012.   Cataract    left eye   Diverticulitis    Diverticulosis of colon    GERD (gastroesophageal reflux disease)    Hemorrhoids    History of recurrent UTIs    Hx of adenomatous colonic polyps    tubular   Hyperlipidemia    Hypertension    Peripheral vascular disease (HCC)    Sleep apnea    wears CPAP-  03/07/2020 no longer wears CPAP    Past Surgical History:  Procedure Laterality Date   ANGIOPLASTY  5/08  and  12/08    stent x3   Cataract left     COLONOSCOPY     EYE SURGERY Left    laser eye surgery for blood leakage   FOOT TENDON SURGERY Left Sept. 20, 2016    Left Calf / foot -   Dr. Harden   MASS EXCISION Right 07/13/2019   Procedure: EXCISION VOLAR CYST RIGHT WRIST;  Surgeon: Murrell Kuba, MD;  Location: [facility];  Service: Orthopedics;  Laterality: Right;  IV REGIONAL FOREARM BLOCK   Right shoulder surgery     Vitrectomy left eye       reports that she has never smoked. She has never used smokeless tobacco. She reports current alcohol  use of about 2.0 - 3.0 standard drinks of alcohol  per week. She reports that she does not use drugs.  Family History  Problem Relation Age of Onset   Colon polyps Brother    Heart disease Brother        before age 46   Hyperlipidemia Brother    Prostate cancer Brother    Heart disease Father        before age 70   Deep vein thrombosis Father    Hyperlipidemia Father    Heart attack Father 68   Stroke Father    Heart disease Brother    Heart attack Brother    Cancer Brother        Merkle cell   Stroke Mother        TIA's   Dementia Mother    Ovarian cancer Maternal Grandmother    Colon polyps Paternal Grandfather    Liver cancer Paternal Grandfather    Heart disease Other        Grandmother    Colon cancer Neg Hx    Esophageal cancer Neg Hx    Stomach cancer Neg Hx    Rectal cancer Neg Hx      Physical Exam: Vitals:   02/20/24 1011 02/20/24 1200 02/20/24 1339 02/20/24 1715  BP:  (!) 170/69  (!) 157/72  Pulse:  63  66  Resp:  17  15  Temp:   98 F (36.7 C)   TempSrc:   Oral   SpO2: 99% 95%  95%    Gen: Awake, alert, NAD, robust appearing for age   CV: Regular, normal S1, S2, no murmurs  Resp: Normal WOB, CTAB  Abd: Flat, normoactive, nontender MSK: Symmetric, no edema  Skin: No rashes or lesions  to exposed skin  Neuro: Alert and interactive  Psych: euthymic, appropriate    Data review:   Labs  reviewed, notable for:   HS trop 22 -> 21  WBC 11  Ddimer neg   Micro:  Results for orders placed or performed during the hospital encounter of 07/09/19  SARS CORONAVIRUS 2 (TAT 6-24 HRS) Nasopharyngeal Nasopharyngeal Swab     Status: None   Collection Time: 07/09/19  1:06 PM   Specimen: Nasopharyngeal Swab  Result Value Ref Range Status   SARS Coronavirus 2 NEGATIVE NEGATIVE Final    Comment: (NOTE) SARS-CoV-2 target nucleic acids are NOT DETECTED. The SARS-CoV-2 RNA is generally detectable in upper and lower respiratory specimens during the acute phase of infection. Negative results do not preclude SARS-CoV-2 infection, do not rule out co-infections with other pathogens, and should not be used as the sole basis for treatment or other patient management decisions. Negative results must be combined with clinical observations, patient history, and epidemiological information. The expected result is Negative. Fact Sheet for Patients: HairSlick.no Fact Sheet for Healthcare Providers: quierodirigir.com This test is not yet approved or cleared by the [place]  FDA and  has been authorized for detection and/or diagnosis of SARS-CoV-2 by FDA under an Emergency Use Authorization (EUA). This EUA will remain  in effect (meaning this test can be used) for the duration of the COVID-19 declaration under Section 56 4(b)(1) of the Act, 21 U.S.C. section 360bbb-3(b)(1), unless the authorization is terminated or revoked sooner. Performed at [facility], [address] [place], [place] [address]     Imaging reviewed:  [facility] Chest Port 1 View Result Date: 02/20/2024 CLINICAL DATA:  Left chest, shoulder and upper arm pain. EXAM: PORTABLE CHEST 1 VIEW COMPARISON:  12/27/2012. FINDINGS: Normal-sized heart. Tortuous and partially  calcified thoracic aorta. Clear lungs with normal vascularity. Cervical spine degenerative changes. IMPRESSION: No acute abnormality. Electronically Signed   By: Elspeth Bathe M.D.   On: 02/20/2024 10:29    EKG:  Reviewed, SR borderline LAE, with PRWP, no acute ischemic changes.   ED Course:  Treated with aspirin  324 mg    Assessment/Plan:  83 y.o. female with hx CAD with PCI, carotid artery disease, PAD, HTN, HLD, OSA, who presented with left shoulder pain likely subacute progressive angina.   Left shoulder pain, suspect anginal equivalent  Subacute pattern of progressive angina   Hx CAD, PCI to LAD, Diag, RCA in '08  P/w ~ 1 month of recurrent L shoulder pain (presenting symptom with obstructive CAD prior to PCI in '08), and worsening x weeks now daily. HS trop low level and flat 22 -> 21. EKG with no acute ischemic changes. No recent hx of cath. Last stress was in 2/'23, was normal, low risk. Her last echo was in '23 with LVEF 55-60% no regional wall motion abnormalities.  -- Routine cardiology consult, appreciate assistance; Messaged with Dr. Mona and PA [facility], team will see tonight  -- Loaded with aspirin  324 mg x1, continue aspirin  81 mg, and home plavix ; (only taking Plavix  PTA)  -- Hold on AC for now, appreciate cardiology input, if escalating pattern of pain would start  -- NTG paste prn for chest pain  -- Continue home Rosuvastatin , vascepa . Also on Repatha  OP. Check lipids and A1c,  -- NPO at [facility] in case of stress / cath   Chronic medical problems:  Carotid artery disease, PAD: See above re: antiPLT / cholesterol mgmt.  HTN: continue home amlodipine   HLD: see CAD  OSA: Not using CPAP    There is no height or weight on file to calculate BMI.  DVT prophylaxis:  Lovenox Code Status:  Full Code Diet:  Diet Orders (From admission, onward)    None      Family Communication:  Yes discussed with niece at bedside   Consults:  Cardiology   Admission status:    Observation, Telemetry bed  Severity of Illness: The appropriate patient status for this patient is OBSERVATION. Observation status is judged to be reasonable and necessary in order to provide the required intensity of service to ensure the patient's safety. The patient's presenting symptoms, physical exam findings, and initial radiographic and laboratory data in the context of their medical condition is felt to place them at decreased risk for further clinical deterioration. Furthermore, it is anticipated that the patient will be medically stable for discharge from the hospital within 2 midnights of admission.    Dorn Dawson, MD [facility]  How to contact the [facility] Attending or Consulting provider 7A - 7P or covering provider during after hours 7P -7A, for this patient.  Check the care team in [facility] and look for a) attending/consulting [facility] provider listed and b) the [facility] team listed Log into [email or web address] and use [facility]'s universal password to access. If you do not have the password, please contact the hospital operator. Locate the [facility] provider you are looking for under [facility] and page to a number that you can be directly reached. If you still have difficulty reaching the provider, please page the [facility] (Director on Call) for the Hospitalists listed on amion for assistance.  02/20/2024, 7:15 PM

## 2024-02-20 NOTE — Telephone Encounter (Signed)
 Called pt reports has had SOB and left arm pain with activity for 1 week.  Pt reports the more she walks or goes up an incline pain intensifies.  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  Reports is extremely tired.  Per MD note from 03/23/24:history of CAD with previous stenting of the mid LAD, Diagonal and Proximal and mid RCA, HTN, HLD, OSA, carotid artery disease here today for follow up.   At her initial visit in our office November 2012, she had c/o left arm pain and her prior anginal equivalent was left arm pain.   Pt reports has left arm pain that goes to chest area.  Denies sweating and nausea.  Rates pain 8:10 when occurs.  Pain last 1-2 hours after onset.  Pt has not taken NTG.  Has in possession advised to use when has left arm pain/ chest discomfort. Pt expresses understanding.    Advised to go to ED for evaluation.  Pt reports pain only occurs with activity is taking it easy and would rather be seen in the office. Pt reports does not want to go to ED d/t wait time and doesn't think pain is bad enough for ED visit.  Would like to be seen in office before pain worsens and possibly has another heart attack.  Again advised pt our office is unequipped to handle emergent situations and the best place to be is the ED.  Also advised pt symptoms are similar to previous heart attacks and for her safety ED visit is warranted.   Pt expresses understanding.

## 2024-02-20 NOTE — ED Triage Notes (Signed)
 Pt c/o pain in L shoulder, L upper arm pain radiating to L side of chest which is similar to the pain she had when she had cardiac issues years go. Pt reports s/s have been occurring on exertion with some discomfort at rest.

## 2024-02-21 ENCOUNTER — Other Ambulatory Visit (HOSPITAL_COMMUNITY): Payer: Self-pay

## 2024-02-21 DIAGNOSIS — R0789 Other chest pain: Secondary | ICD-10-CM

## 2024-02-21 LAB — CBC
HCT: 44.6 % (ref 36.0–46.0)
Hemoglobin: 14.4 g/dL (ref 12.0–15.0)
MCH: 29.5 pg (ref 26.0–34.0)
MCHC: 32.3 g/dL (ref 30.0–36.0)
MCV: 91.4 fL (ref 80.0–100.0)
Platelets: 368 K/uL (ref 150–400)
RBC: 4.88 MIL/uL (ref 3.87–5.11)
RDW: 12.7 % (ref 11.5–15.5)
WBC: 11.7 K/uL — ABNORMAL HIGH (ref 4.0–10.5)
nRBC: 0 % (ref 0.0–0.2)

## 2024-02-21 LAB — BASIC METABOLIC PANEL WITH GFR
Anion gap: 12 (ref 5–15)
BUN: 11 mg/dL (ref 8–23)
CO2: 23 mmol/L (ref 22–32)
Calcium: 9.2 mg/dL (ref 8.9–10.3)
Chloride: 105 mmol/L (ref 98–111)
Creatinine, Ser: 0.8 mg/dL (ref 0.44–1.00)
GFR, Estimated: >60 mL/min (ref >60)
Glucose, Bld: 96 mg/dL (ref 70–99)
Potassium: 3.8 mmol/L (ref 3.5–5.1)
Sodium: 140 mmol/L (ref 135–145)

## 2024-02-21 LAB — PHOSPHORUS: Phosphorus: 3.9 mg/dL (ref 2.5–4.6)

## 2024-02-21 LAB — PROTIME-INR
INR: 0.9 (ref 0.8–1.2)
Prothrombin Time: 12.1 s (ref 11.4–15.2)

## 2024-02-21 LAB — HEMOGLOBIN A1C
Hgb A1c MFr Bld: 5.2 % (ref 4.8–5.6)
Mean Plasma Glucose: 102.54 mg/dL

## 2024-02-21 LAB — MAGNESIUM: Magnesium: 2.3 mg/dL (ref 1.7–2.4)

## 2024-02-21 LAB — LIPID PANEL
Cholesterol: 100 mg/dL (ref 0–200)
HDL: 61 mg/dL (ref >40)
LDL Cholesterol: 23 mg/dL (ref 0–99)
Total CHOL/HDL Ratio: 1.6 ratio
Triglycerides: 79 mg/dL (ref <150)
VLDL: 16 mg/dL (ref 0–40)

## 2024-02-21 LAB — TROPONIN I (HIGH SENSITIVITY): Troponin I (High Sensitivity): 11 ng/L (ref <18)

## 2024-02-21 MED ORDER — ENOXAPARIN SODIUM 40 MG/0.4ML IJ SOSY: 40.0000 mg | PREFILLED_SYRINGE | INTRAMUSCULAR | Status: DC

## 2024-02-21 MED ORDER — ISOSORBIDE MONONITRATE ER 30 MG PO TB24
30.0000 mg | ORAL_TABLET | Freq: Every day | ORAL | 0 refills | Status: DC
  Filled 2024-02-21: qty 30, 30d supply, fill #0

## 2024-02-21 NOTE — Discharge Instructions (Signed)
 Follow with Primary MD [name], MD in 7 days   Get CBC, CMP, checked  by Primary MD next visit.   Disposition Home    Diet: Heart Healthy    On your next visit with your primary care physician please Get Medicines reviewed and adjusted.   Please request your Prim.MD to go over all Hospital Tests and Procedure/Radiological results at the follow up, please get all Hospital records sent to your Prim MD by signing hospital release before you go home.   If you experience worsening of your admission symptoms, develop shortness of breath, life threatening emergency, suicidal or homicidal thoughts you must seek medical attention immediately by calling 911 or calling your MD immediately  if symptoms less severe.  You Must read complete instructions/literature along with all the possible adverse reactions/side effects for all the Medicines you take and that have been prescribed to you. Take any new Medicines after you have completely understood and accpet all the possible adverse reactions/side effects.   Do not drive, operating heavy machinery, perform activities at heights, swimming or participation in water activities or provide baby sitting services if your were admitted for syncope or siezures until you have seen by Primary MD or a Neurologist and advised to do so again.  Do not drive when taking Pain medications.    Do not take more than prescribed Pain, Sleep and Anxiety Medications  Special Instructions: If you have smoked or chewed Tobacco  in the last 2 yrs please stop smoking, stop any regular Alcohol   and or any Recreational drug use.  Wear Seat belts while driving.   Please note  You were cared for by a hospitalist during your hospital stay. If you have any questions about your discharge medications or the care you received while you were in the hospital after you are discharged, you can call the unit and asked to speak with the hospitalist on call if the hospitalist that  took care of you is not available. Once you are discharged, your primary care physician will handle any further medical issues. Please note that NO REFILLS for any discharge medications will be authorized once you are discharged, as it is imperative that you return to your primary care physician (or establish a relationship with a primary care physician if you do not have one) for your aftercare needs so that they can reassess your need for medications and monitor your lab values.

## 2024-02-21 NOTE — Discharge Summary (Signed)
 Physician Discharge Summary  [name] FMW:[id number] DOB: 1940-12-17 DOA: 02/20/2024  PCP: [name], MD  Admit date: 02/20/2024 Discharge date: 02/21/2024  Admitted From: (Home) Disposition:  (Home )  Recommendations for Outpatient Follow-up:  Follow up with PCP in 1-2 weeks Please obtain BMP/CBC in one week Urology to arrange for outpatient follow-up   Diet recommendation: Heart Healthy   Brief/Interim Summary:  [name] is a 83 y.o. female with hx of CAD with PCI, carotid artery disease, PAD, HTN, HLD, OSA, who presented with left shoulder pain. Reports that she had similar symptoms prior to her PCIs in '08. Over past month has again developed exertional L shoulder and left upper arm pain. This typically occurs when she goes for a walk x 2 times daily. However, over past couple weeks has escalated in frequency.  Which prompted her to come to ED, she was admitted for further workup per cardiology recommendation, her troponins are non-ACS pattern, EKG is reassuring, she was seen by cardiology, cleared for discharge, they will arrange for outpatient follow-up, and recommend starting on low-dose Imdur  for stable angina.    Angina Hx CAD, PCI to LAD, Diag, RCA in '08  - Cardiology input greatly appreciated, patient has accelerated angina, but remains mainly on exertion, continue with current regimen, will add Imdur  per cardiology recommendation, she already has as needed nitro, cardiology to arrange for outpatient follow-up    Chronic medical problems:  Carotid artery disease, PAD: See above re: antiPLT / cholesterol mgmt.  HTN: continue home amlodipine   HLD: see CAD  OSA: Not using CPAP     Discharge Diagnoses:  Active Problems:   CAD   Angina of effort [facility])    Discharge Instructions  Discharge Instructions     Diet - low sodium heart healthy   Complete by: As directed    Discharge instructions   Complete by: As directed    Follow with Primary MD [name], MD in 7 days   Get CBC, CMP, checked  by Primary MD next visit.   Disposition Home    Diet: Heart Healthy    On your next visit with your primary care physician please Get Medicines reviewed and adjusted.   Please request your Prim.MD to go over all Hospital Tests and Procedure/Radiological results at the follow up, please get all Hospital records sent to your Prim MD by signing hospital release before you go home.   If you experience worsening of your admission symptoms, develop shortness of breath, life threatening emergency, suicidal or homicidal thoughts you must seek medical attention immediately by calling 911 or calling your MD immediately  if symptoms less severe.  You Must read complete instructions/literature along with all the possible adverse reactions/side effects for all the Medicines you take and that have been prescribed to you. Take any new Medicines after you have completely understood and accpet all the possible adverse reactions/side effects.   Do not drive, operating heavy machinery, perform activities at heights, swimming or participation in water activities or provide baby sitting services if your were admitted for syncope or siezures until you have seen by Primary MD or a Neurologist and advised to do so again.  Do not drive when taking Pain medications.    Do not take more than prescribed Pain, Sleep and Anxiety Medications  Special Instructions: If you have smoked or chewed Tobacco  in the last 2 yrs please stop smoking, stop any regular Alcohol   and or any Recreational drug use.  Wear Seat belts while driving.   Please note  You were cared for by a hospitalist during your hospital stay. If you have any questions about your discharge medications or the care you received while you were in the hospital after you are discharged, you can call the unit and asked to speak with the hospitalist on call if the hospitalist that took care of you is not  available. Once you are discharged, your primary care physician will handle any further medical issues. Please note that NO REFILLS for any discharge medications will be authorized once you are discharged, as it is imperative that you return to your primary care physician (or establish a relationship with a primary care physician if you do not have one) for your aftercare needs so that they can reassess your need for medications and monitor your lab values.   Increase activity slowly   Complete by: As directed       Allergies as of 02/21/2024       Reactions   Other Hives, Itching, Swelling   VICRYL STITCHES Tongue swelling Other reaction(s): Unknown   Fluoride Preparations Other (See Comments)   A test for eye to make pictures, (  causing pressure of Left arm )   Rosuvastatin  Other (See Comments)   Leg pains, cramping        Medication List     TAKE these medications    amLODipine  2.5 MG tablet Commonly known as: NORVASC  TAKE 1 TABLET BY MOUTH EVERY DAY   ascorbic acid  500 MG tablet Commonly known as: VITAMIN C  Take 500 mg by mouth daily.   cholecalciferol  1000 units tablet Commonly known as: VITAMIN D Take 1,000 Units by mouth daily.   clopidogrel  75 MG tablet Commonly known as: PLAVIX  TAKE 1 TABLET BY MOUTH EVERY DAY   COQ10 PO Take 1 capsule by mouth daily.   cyanocobalamin  1000 MCG tablet Commonly known as: VITAMIN B12 Take 1,000 mcg by mouth daily.   EPINEPHrine 0.3 mg/0.3 mL Soaj injection Commonly known as: EPI-PEN See admin instructions.   fluticasone 50 MCG/ACT nasal spray Commonly known as: FLONASE Place 1 spray into both nostrils as needed for allergies.   Gemtesa 75 MG Tabs Generic drug: Vibegron Take 75 mg by mouth daily.   isosorbide  mononitrate 30 MG 24 hr tablet Commonly known as: IMDUR  Take 1 tablet (30 mg total) by mouth daily.   levocetirizine 5 MG tablet Commonly known as: XYZAL  Take 1 tablet by mouth daily.   multivitamin with  minerals Tabs tablet Take 1 tablet by mouth daily.   nitroGLYCERIN  0.4 MG SL tablet Commonly known as: NITROSTAT  PLACE 1 TABLET UNDER THE TONGUE EVERY 5 MINUTES AS NEEDED FOR CHEST PAIN.   Psyllium 30.9 % Powd Take by mouth 2 (two) times daily.   Repatha  SureClick 140 MG/ML Soaj Generic drug: Evolocumab  Inject 140 mg into the skin every 14 (fourteen) days.   rosuvastatin  10 MG tablet Commonly known as: CRESTOR  Take 1 tablet (10 mg total) by mouth daily.   Vascepa  1 g capsule Generic drug: icosapent  Ethyl TAKE 2 CAPSULES BY MOUTH 2 TIMES DAILY.        Follow-up Information     Janene Boer, [place] Follow up on 03/05/2024.   Specialties: Cardiology, Radiology Why: 1:55PM. Cardiology follow up Contact information: [address] [place] [address] [phone]                Allergies  Allergen Reactions   Other Hives, Itching and Swelling  VICRYL STITCHES Tongue swelling Other reaction(s): Unknown   Fluoride Preparations Other (See Comments)    A test for eye to make pictures, (  causing pressure of Left arm )   Rosuvastatin  Other (See Comments)    Leg pains, cramping    Consultations: Cardiology   Procedures/Studies: DG Shoulder Left Port Result Date: 02/20/2024 EXAM: 1 VIEW XRAY OF THE LEFT SHOULDER 02/20/2024 08:18:00 PM COMPARISON: None available. CLINICAL HISTORY: Left shoulder pain for a few days. FINDINGS: BONES AND JOINTS: Glenohumeral joint is normally aligned. No acute fracture or dislocation. The [facility] joint is unremarkable in appearance. SOFT TISSUES: No abnormal calcifications. Visualized lung is unremarkable. IMPRESSION: 1. No significant abnormality. Electronically signed by: Norman Gatlin MD 02/20/2024 08:22 PM EDT RP Workstation: [id number]   DG Chest Port 1 View Result Date: 02/20/2024 CLINICAL DATA:  Left chest, shoulder and upper arm pain. EXAM: PORTABLE CHEST 1 VIEW COMPARISON:  12/27/2012. FINDINGS: Normal-sized heart. Tortuous and  partially calcified thoracic aorta. Clear lungs with normal vascularity. Cervical spine degenerative changes. IMPRESSION: No acute abnormality. Electronically Signed   By: Elspeth Bathe M.D.   On: 02/20/2024 10:29      Subjective:  She denies any left shoulder pain, chest pain, or shortness of breath overnight Discharge Exam: Vitals:   02/21/24 0500 02/21/24 0853  BP:  (!) 140/67  Pulse:  74  Resp: 16 16  Temp:  97.6 F (36.4 C)  SpO2:     Vitals:   02/21/24 0000 02/21/24 0326 02/21/24 0500 02/21/24 0853  BP: (!) 121/50 131/64  (!) 140/67  Pulse:  60  74  Resp: 16 12 16 16   Temp:  98.1 F (36.7 C)  97.6 F (36.4 C)  TempSrc:  Axillary  Oral  SpO2:  94%    Weight:   68.8 kg   Height:        General: Pt is alert, awake, not in acute distress Cardiovascular: RRR, S1/S2 +, no rubs, no gallops Respiratory: CTA bilaterally, no wheezing, no rhonchi Abdominal: Soft, NT, ND, bowel sounds + Extremities: no edema, no cyanosis    The results of significant diagnostics from this hospitalization (including imaging, microbiology, ancillary and laboratory) are listed below for reference.     Microbiology: No results found for this or any previous visit (from the past 240 hours).   Labs: BNP (last 3 results) No results for input(s): BNP in the last 8760 hours. Basic Metabolic Panel: Recent Labs  Lab 02/20/24 1001 02/21/24 0558  NA 139 140  K 4.1 3.8  CL 104 105  CO2 22 23  GLUCOSE 93 96  BUN 11 11  CREATININE 0.88 0.80  CALCIUM  9.6 9.2  MG  --  2.3  PHOS  --  3.9   Liver Function Tests: Recent Labs  Lab 02/20/24 1156  AST 30  ALT 18  ALKPHOS 82  BILITOT 0.4  PROT 6.6  ALBUMIN 4.3   No results for input(s): LIPASE, AMYLASE in the last 168 hours. No results for input(s): AMMONIA in the last 168 hours. CBC: Recent Labs  Lab 02/20/24 1001 02/21/24 0558  WBC 11.6* 11.7*  HGB 13.6 14.4  HCT 41.4 44.6  MCV 91.8 91.4  PLT 340 368   Cardiac  Enzymes: No results for input(s): CKTOTAL, CKMB, CKMBINDEX, TROPONINI in the last 168 hours. BNP: Invalid input(s): POCBNP CBG: No results for input(s): GLUCAP in the last 168 hours. D-Dimer Recent Labs    02/20/24 1156  DDIMER 0.45   Hgb A1c Recent  Labs    02/21/24 0558  HGBA1C 5.2   Lipid Profile Recent Labs    02/21/24 0558  CHOL 100  HDL 61  LDLCALC 23  TRIG 79  CHOLHDL 1.6   Thyroid function studies No results for input(s): TSH, T4TOTAL, T3FREE, THYROIDAB in the last 72 hours.  Invalid input(s): FREET3 Anemia work up No results for input(s): VITAMINB12, FOLATE, FERRITIN, TIBC, IRON, RETICCTPCT in the last 72 hours. Urinalysis    Component Value Date/Time   COLORURINE YELLOW 12/27/2012 1149   APPEARANCEUR CLEAR 12/27/2012 1149   LABSPEC 1.013 12/27/2012 1149   PHURINE 6.5 12/27/2012 1149   GLUCOSEU NEGATIVE 12/27/2012 1149   HGBUR SMALL (A) 12/27/2012 1149   BILIRUBINUR NEGATIVE 12/27/2012 1149   KETONESUR NEGATIVE 12/27/2012 1149   PROTEINUR NEGATIVE 12/27/2012 1149   UROBILINOGEN 0.2 12/27/2012 1149   NITRITE NEGATIVE 12/27/2012 1149   LEUKOCYTESUR NEGATIVE 12/27/2012 1149   Sepsis Labs Recent Labs  Lab 02/20/24 1001 02/21/24 0558  WBC 11.6* 11.7*   Microbiology No results found for this or any previous visit (from the past 240 hours).   SIGNED:   Brayton Lye, MD  [facility] 02/21/2024, 10:44 AM Pager   If 7PM-7AM, please contact night-coverage [email or web address]

## 2024-02-21 NOTE — Progress Notes (Signed)
  Progress Note  Patient Name: [name] Date of Encounter: 02/21/2024 [facility] Cardiologist: [name], MD   Interval Summary   Feeling well while at rest.  Does have some shoulder discomfort when she exerts herself.  Otherwise no acute complaints at this time.  Vital Signs Vitals:   02/21/24 0000 02/21/24 0326 02/21/24 0500 02/21/24 0853  BP: (!) 121/50 131/64  (!) 140/67  Pulse:  60  74  Resp: 16 12 16 16   Temp:  98.1 F (36.7 C)  97.6 F (36.4 C)  TempSrc:  Axillary  Oral  SpO2:  94%    Weight:   68.8 kg   Height:       No intake or output data in the 24 hours ending 02/21/24 1013    02/21/2024    5:00 AM 02/20/2024    7:58 PM 10/28/2023   10:45 AM  Last 3 Weights  Weight (lbs) 151 lb 11.2 oz 153 lb 3.2 oz 157 lb  Weight (kg) 68.811 kg 69.491 kg 71.215 kg      Telemetry/ECG  Sinus rhythm- Personally Reviewed  Physical Exam  GEN: No acute distress.   Neck: No JVD Cardiac: RRR, no murmurs, rubs, or gallops.  Respiratory: Clear to auscultation bilaterally. GI: Soft, nontender, non-distended  MS: No edema  Assessment & Plan   1.  Angina: Patient has had accelerating angina over the last 2 weeks with pain with exertion.  She feels well at rest.  Her troponins have been negative and her EKG is unchanged.  She wishes to be discharged from the hospital.  Jabree Pernice plan for discharge today.  Tirrell Buchberger have her follow-up soon in clinic to discuss next steps.  Left heart catheterization may be her most beneficial plan. [facility] Joshaua Epple sign off.   The patient is ready for discharge today from a cardiac standpoint. Medication Recommendations: Needs another prescription for nitroglycerin  Other recommendations (labs, testing, etc): None Follow up as an outpatient: Vikram Tillett be arranged in cardiology clinic For questions or updates, please contact [facility] Please consult [email or web address] for contact info under       Signed, Dyland Panuco Gladis Norton, MD

## 2024-02-23 ENCOUNTER — Other Ambulatory Visit: Payer: Self-pay | Admitting: *Deleted

## 2024-02-23 NOTE — Telephone Encounter (Signed)
 Patient calling this morning and said she went to ED like she was advised but does not feel she got any thing accomplished there. She would like to speak to Dr Verlin. Please advise

## 2024-02-23 NOTE — Telephone Encounter (Signed)
 Called and made patient aware that provider has recommedned appt Wednesday of this week at 2:40 pm. Understanding verbalized

## 2024-02-25 ENCOUNTER — Encounter: Payer: Self-pay | Admitting: Cardiovascular Disease

## 2024-02-25 ENCOUNTER — Ambulatory Visit: Attending: Cardiovascular Disease | Admitting: Cardiovascular Disease

## 2024-02-25 VITALS — BP 140/100 | HR 77 | Ht 62.0 in | Wt 154.8 lb

## 2024-02-25 DIAGNOSIS — I25110 Atherosclerotic heart disease of native coronary artery with unstable angina pectoris: Secondary | ICD-10-CM | POA: Diagnosis not present

## 2024-02-25 DIAGNOSIS — I10 Essential (primary) hypertension: Secondary | ICD-10-CM

## 2024-02-25 DIAGNOSIS — E7800 Pure hypercholesterolemia, unspecified: Secondary | ICD-10-CM

## 2024-02-25 DIAGNOSIS — I6523 Occlusion and stenosis of bilateral carotid arteries: Secondary | ICD-10-CM | POA: Diagnosis not present

## 2024-02-25 NOTE — Patient Instructions (Signed)
 Medication Instructions:  Your physician recommends that you continue on your current medications as directed. Please refer to the Current Medication list given to you today.  *If you need a refill on your cardiac medications before your next appointment, please call your pharmacy*  Lab Work: None.  If you have labs (blood work) drawn today and your tests are completely normal, you will receive your results only by: MyChart Message (if you have MyChart) OR A paper copy in the mail If you have any lab test that is abnormal or we need to change your treatment, we will call you to review the results.  Testing/Procedures:       Cardiac/Peripheral Catheterization   You are scheduled for a Cardiac Catheterization on Wednesday, September 24 with Dr. [name].  1. Please arrive at the [facility] (Main Entrance A) at [facility]: [address] [place], [place] [address] at 6:30 AM (This time is 2 hour(s) before your procedure to ensure your preparation).   Free valet parking service is available. You will check in at ADMITTING. The support person will be asked to wait in the waiting room.  It is OK to have someone drop you off and come back when you are ready to be discharged.        Special note: Every effort is made to have your procedure done on time. Please understand that emergencies sometimes delay scheduled procedures.  2. Diet: Nothing to eat after midnight.  3. Hydration:You need to be well hydrated before your procedure. On September 4, you may drink approved liquids (see below) until 2 hours before the procedure, with 16 oz of water as your last intake.   List of approved liquids water, clear juice, clear tea, black coffee, fruit juices, non-citric and without pulp, carbonated beverages, Gatorade, Kool -Aid, plain Jello-O and plain ice popsicles.  4. Labs: You have already completed your labs.  5. Medication instructions in preparation for your  procedure:   Contrast Allergy: No  On the morning of your procedure, take Aspirin  81 mg and Plavix /Clopidogrel  and any morning medicines NOT listed above.  You may use sips of water.  6. Plan to go home the same day, you will only stay overnight if medically necessary. 7. You MUST have a responsible adult to drive you home. 8. An adult MUST be with you the first 24 hours after you arrive home. 9. Bring a current list of your medications, and the last time and date medication taken. 10. Bring ID and current insurance cards. 11.Please wear clothes that are easy to get on and off and wear slip-on shoes.  Thank you for allowing us  to care for you!   -- [facility]      Follow-Up: At [facility], you and your health needs are our priority.  As part of our continuing mission to provide you with exceptional heart care, our providers are all part of one team.  This team includes your primary Cardiologist (physician) and Advanced Practice Providers or APPs (Physician Assistants and Nurse Practitioners) who all work together to provide you with the care you need, when you need it.  Your next appointment:   3 week(s)  Provider:   One of our Advanced Practice Providers (APPs): Morse Clause, PA-C  Lamarr Satterfield, NP Miriam Shams, NP  Olivia Pavy, PA-C Josefa Beauvais, NP  Leontine Salen, PA-C Orren Fabry, PA-C  [place], [place] Jackee Alberts, NP  Damien Braver, NP Jon Hails,  PA-C  Waddell Donath, PA-C    Dayna Dunn, PA-C  Glendia Ferrier, PA-C Lum Louis, NP Katlyn West, NP Aline Door, PA-C  Evan Williams, PA-C Sheng Haley, PA-C  Xika Zhao, NP Kathleen Johnson, PA-C    We recommend signing up for the patient portal called MyChart.  Sign up information is provided on this After Visit Summary.  MyChart is used to connect with patients for Virtual Visits (Telemedicine).  Patients are able to view lab/test results, encounter notes, upcoming  appointments, etc.  Non-urgent messages can be sent to your provider as well.   To learn more about what you can do with MyChart, go to [email or web address].

## 2024-02-25 NOTE — Progress Notes (Signed)
 Chief Complaint  Patient presents with   Follow-up    Chest pain   History of Present Illness: 83 yo female with history of CAD with previous stenting of the mid LAD, Diagonal and Proximal and mid RCA, HTN, HLD, sleep apnea and carotid artery disease who is here today for follow up. Her cardiac history includes drug eluting stents placed in the LAD and Diagonal in May 2008, followed by drug eluting stent placement in the RCA in December 2008. At her initial visit in our office November 2012, she had c/o left arm pain and her prior anginal equivalent was left arm pain. She had recurrence of pain in her left arm with exertion associated with fatigue and SOB in 2013. Cardiac cath June 2013 with stable disease, patent stents LAD, Diagonal, RCA. She was admitted July 2014 with left shoulder pain. Stress myoview  12/28/12 with no ischemia. Most recent stress test in February 2024 did not show ischemia. She had left sided weakness in March 2015 and was seen in [facility] and was found to have moderate carotid artery disease. She has been followed in the [facility] office for her carotid disease.Carotid artery dopplers May 2025 with moderate RICA stenosis (40-59%) and mild disease in the LICA.  Echo September 2023 with LVEF=55-60%. No significant valve disease.    She is here today for follow up. She has been having left shoulder pain. She was admitted to [facility] 02/20/24 for workup of the shoulder pain and there was no evidence of ACS. She was seen by Dr. Inocencio and he felt that discharge home was appropriate with close outpatient follow up. She tells me today that she continues to have exertional left arm pain. She denies dyspnea, palpitations, lower extremity edema, orthopnea, PND, dizziness, near syncope or syncope. She hast not tolerated Imdur  secondary to headaches.    Primary Care Physician: [name], MD  Past Medical History:  Diagnosis Date   Allergy    Arthritis    CAD (coronary artery disease)    a. DES to  LAD, diagonal 10/2006. b. DES to RCA 05/2007. c. Last cath 11/2012: patent stents, stable dz. d. Lexiscan  cardiolite  negative 12/2012.   Cataract    left eye   Diverticulitis    Diverticulosis of colon    GERD (gastroesophageal reflux disease)    Hemorrhoids    History of recurrent UTIs    Hx of adenomatous colonic polyps    tubular   Hyperlipidemia    Hypertension    Peripheral vascular disease (HCC)    Sleep apnea    wears CPAP- 03/07/2020 no longer wears CPAP    Past Surgical History:  Procedure Laterality Date   ANGIOPLASTY  5/08  and  12/08    stent x3   Cataract left     COLONOSCOPY     EYE SURGERY Left    laser eye surgery for blood leakage   FOOT TENDON SURGERY Left Sept. 20, 2016    Left Calf / foot -   Dr. Harden   MASS EXCISION Right 07/13/2019   Procedure: EXCISION VOLAR CYST RIGHT WRIST;  Surgeon: Murrell Kuba, MD;  Location: [facility];  Service: Orthopedics;  Laterality: Right;  IV REGIONAL FOREARM BLOCK   Right shoulder surgery     Vitrectomy left eye      Current Outpatient Medications  Medication Sig Dispense Refill   amLODipine  (NORVASC ) 2.5 MG tablet TAKE 1 TABLET BY MOUTH EVERY DAY 90 tablet 3   cholecalciferol  (VITAMIN D)  1000 UNITS tablet Take 1,000 Units by mouth daily.     clopidogrel  (PLAVIX ) 75 MG tablet TAKE 1 TABLET BY MOUTH EVERY DAY 90 tablet 2   Coenzyme Q10 (COQ10 PO) Take 1 capsule by mouth daily.     EPINEPHrine 0.3 mg/0.3 mL IJ SOAJ injection See admin instructions.     Evolocumab  (REPATHA  SURECLICK) 140 MG/ML SOAJ Inject 140 mg into the skin every 14 (fourteen) days. 6 mL 3   fluticasone (FLONASE) 50 MCG/ACT nasal spray Place 1 spray into both nostrils as needed for allergies.   2   GEMTESA 75 MG TABS Take 75 mg by mouth daily.     isosorbide  mononitrate (IMDUR ) 30 MG 24 hr tablet Take 1 tablet (30 mg total) by mouth daily. 30 tablet 0   levocetirizine (XYZAL ) 5 MG tablet Take 1 tablet by mouth daily.     Multiple Vitamin  (MULTIVITAMIN WITH MINERALS) TABS Take 1 tablet by mouth daily.     nitroGLYCERIN  (NITROSTAT ) 0.4 MG SL tablet PLACE 1 TABLET UNDER THE TONGUE EVERY 5 MINUTES AS NEEDED FOR CHEST PAIN. 25 tablet 8   Psyllium 30.9 % POWD Take by mouth 2 (two) times daily.     rosuvastatin  (CRESTOR ) 10 MG tablet Take 1 tablet (10 mg total) by mouth daily. 90 tablet 2   VASCEPA  1 g capsule TAKE 2 CAPSULES BY MOUTH 2 TIMES DAILY. 360 capsule 3   vitamin B-12 (CYANOCOBALAMIN ) 1000 MCG tablet Take 1,000 mcg by mouth daily.     vitamin C  (ASCORBIC ACID ) 500 MG tablet Take 500 mg by mouth daily.     No current facility-administered medications for this visit.    Allergies  Allergen Reactions   Other Hives, Itching and Swelling    VICRYL STITCHES Tongue swelling Other reaction(s): Unknown   Fluoride Preparations Other (See Comments)    A test for eye to make pictures, (  causing pressure of Left arm )   Rosuvastatin  Other (See Comments)    Leg pains, cramping    Social History   Socioeconomic History   Marital status: Married    Spouse name: Not on file   Number of children: 0   Years of education: Not on file   Highest education level: Not on file  Occupational History   Occupation: retired     Associate Professor: RETIRED  Tobacco Use   Smoking status: Never   Smokeless tobacco: Never  Vaping Use   Vaping status: Never Used  Substance and Sexual Activity   Alcohol  use: Yes    Alcohol /week: 2.0 - 3.0 standard drinks of alcohol     Types: 2 - 3 Standard drinks or equivalent per week    Comment: 1 per day   Drug use: No   Sexual activity: Not on file  Other Topics Concern   Not on file  Social History Narrative   Not on file   Social Drivers of Health   Financial Resource Strain: Not on file  Food Insecurity: No Food Insecurity (02/20/2024)   Hunger Vital Sign    Worried About Running Out of Food in the Last Year: Never true    Ran Out of Food in the Last Year: Never true  Transportation Needs: No  Transportation Needs (02/20/2024)   PRAPARE - Administrator, Civil Service (Medical): No    Lack of Transportation (Non-Medical): No  Physical Activity: Not on file  Stress: Not on file  Social Connections: Patient Declined (02/20/2024)   Social Connection and  Isolation Panel    Frequency of Communication with Friends and Family: Patient declined    Frequency of Social Gatherings with Friends and Family: Patient declined    Attends Religious Services: Patient declined    Database administrator or Organizations: Patient declined    Attends Banker Meetings: Patient declined    Marital Status: Patient declined  Intimate Partner Violence: Not At Risk (02/20/2024)   Humiliation, Afraid, Rape, and Kick questionnaire    Fear of Current or Ex-Partner: No    Emotionally Abused: No    Physically Abused: No    Sexually Abused: No    Family History  Problem Relation Age of Onset   Colon polyps Brother    Heart disease Brother        before age 41   Hyperlipidemia Brother    Prostate cancer Brother    Heart disease Father        before age 24   Deep vein thrombosis Father    Hyperlipidemia Father    Heart attack Father 42   Stroke Father    Heart disease Brother    Heart attack Brother    Cancer Brother        Merkle cell   Stroke Mother        TIA's   Dementia Mother    Ovarian cancer Maternal Grandmother    Colon polyps Paternal Grandfather    Liver cancer Paternal Grandfather    Heart disease Other        Grandmother    Colon cancer Neg Hx    Esophageal cancer Neg Hx    Stomach cancer Neg Hx    Rectal cancer Neg Hx     Review of Systems:  As stated in the HPI and otherwise negative.   BP (!) 140/100   Pulse 77   Ht 5' 2 (1.575 m)   Wt 154 lb 12.8 oz (70.2 kg)   SpO2 98%   BMI 28.31 kg/m   Physical Examination: General: Well developed, well nourished, NAD  HEENT: OP clear, mucus membranes moist  SKIN: warm, dry. No rashes. Neuro: No focal  deficits  Musculoskeletal: Muscle strength 5/5 all ext  Psychiatric: Mood and affect normal  Neck: No JVD, no carotid bruits, no thyromegaly, no lymphadenopathy.  Lungs:Clear bilaterally, no wheezes, rhonci, crackles Cardiovascular: Regular rate and rhythm. No murmurs, gallops or rubs. Abdomen:Soft. Bowel sounds present. Non-tender.  Extremities: No lower extremity edema. Pulses are 2 + in the bilateral DP/PT.  EKG:  EKG is not ordered today. The ekg ordered today demonstrates   Recent Labs: 02/20/2024: ALT 18 02/21/2024: BUN 11; Creatinine, Ser 0.80; Hemoglobin 14.4; Magnesium 2.3; Platelets 368; Potassium 3.8; Sodium 140   Lipid Panel Followed in primary care   Wt Readings from Last 3 Encounters:  02/25/24 154 lb 12.8 oz (70.2 kg)  02/21/24 151 lb 11.2 oz (68.8 kg)  10/28/23 157 lb (71.2 kg)    Assessment and Plan:   1. CAD with unstable angina: She has had stents placed in the LAD, Diagonal and RCA. Last cath in June 2013 with stable CAD.  Nuclear stress test February 2023 with no ischemia. She is now having left shoulder pain with exertion that she says is similar to her prior angina.  Cardiac cath is indicated. Will plan cardiac cath at [facility] on 03/10/24 at 8:30 am.   I have reviewed the risks, indications, and alternatives to cardiac catheterization, possible angioplasty, and stenting with the patient. Risks include  but are not limited to bleeding, infection, vascular injury, stroke, myocardial infection, arrhythmia, kidney injury, radiation-related injury in the case of prolonged fluoroscopy use, emergency cardiac surgery, and death. The patient understands the risks of serious complication is 1-2 in 1000 with diagnostic cardiac cath and 1-2% or less with angioplasty/stenting. Continue Plavix  and statin. Start ASA 81 mg daily on day of cath.     2. HTN: BP is well controlled when she checks her BP at the pharmacy. No changes today.   3. HYPERLIPIDEMIA: Lipids followed in primary  care. LDL 23 in September 2025. Continue statin and Repatha .     4. Carotid artery disease: Moderate RICA stenosis by dopplers in May 2025. Followed in [facility]  5. Cardiac murmur: Noted in 2023. No valve disease noted by echo in 2023.   Labs/ tests ordered today include:  No orders of the defined types were placed in this encounter.  Disposition:   F/U with me or office APP several weeks post cath.   Signed, [name], MD 02/25/2024 3:21 PM    [facility] [address] [place], [place], [place]  [address] Phone: [phone]; Fax: [phone]

## 2024-02-25 NOTE — H&P (View-Only) (Signed)
 Chief Complaint  Patient presents with   Follow-up    Chest pain   History of Present Illness: 83 yo female with history of CAD with previous stenting of the mid LAD, Diagonal and Proximal and mid RCA, HTN, HLD, sleep apnea and carotid artery disease who is here today for follow up. Her cardiac history includes drug eluting stents placed in the LAD and Diagonal in May 2008, followed by drug eluting stent placement in the RCA in December 2008. At her initial visit in our office November 2012, she had c/o left arm pain and her prior anginal equivalent was left arm pain. She had recurrence of pain in her left arm with exertion associated with fatigue and SOB in 2013. Cardiac cath June 2013 with stable disease, patent stents LAD, Diagonal, RCA. She was admitted July 2014 with left shoulder pain. Stress myoview  12/28/12 with no ischemia. Most recent stress test in February 2024 did not show ischemia. She had left sided weakness in March 2015 and was seen in [facility] and was found to have moderate carotid artery disease. She has been followed in the [facility] office for her carotid disease.Carotid artery dopplers May 2025 with moderate RICA stenosis (40-59%) and mild disease in the LICA.  Echo September 2023 with LVEF=55-60%. No significant valve disease.    She is here today for follow up. She has been having left shoulder pain. She was admitted to [facility] 02/20/24 for workup of the shoulder pain and there was no evidence of ACS. She was seen by Dr. Inocencio and he felt that discharge home was appropriate with close outpatient follow up. She tells me today that she continues to have exertional left arm pain. She denies dyspnea, palpitations, lower extremity edema, orthopnea, PND, dizziness, near syncope or syncope. She hast not tolerated Imdur  secondary to headaches.    Primary Care Physician: [name], MD  Past Medical History:  Diagnosis Date   Allergy    Arthritis    CAD (coronary artery disease)    a. DES to  LAD, diagonal 10/2006. b. DES to RCA 05/2007. c. Last cath 11/2012: patent stents, stable dz. d. Lexiscan  cardiolite  negative 12/2012.   Cataract    left eye   Diverticulitis    Diverticulosis of colon    GERD (gastroesophageal reflux disease)    Hemorrhoids    History of recurrent UTIs    Hx of adenomatous colonic polyps    tubular   Hyperlipidemia    Hypertension    Peripheral vascular disease (HCC)    Sleep apnea    wears CPAP- 03/07/2020 no longer wears CPAP    Past Surgical History:  Procedure Laterality Date   ANGIOPLASTY  5/08  and  12/08    stent x3   Cataract left     COLONOSCOPY     EYE SURGERY Left    laser eye surgery for blood leakage   FOOT TENDON SURGERY Left Sept. 20, 2016    Left Calf / foot -   Dr. Harden   MASS EXCISION Right 07/13/2019   Procedure: EXCISION VOLAR CYST RIGHT WRIST;  Surgeon: Murrell Kuba, MD;  Location: [facility];  Service: Orthopedics;  Laterality: Right;  IV REGIONAL FOREARM BLOCK   Right shoulder surgery     Vitrectomy left eye      Current Outpatient Medications  Medication Sig Dispense Refill   amLODipine  (NORVASC ) 2.5 MG tablet TAKE 1 TABLET BY MOUTH EVERY DAY 90 tablet 3   cholecalciferol  (VITAMIN D)  1000 UNITS tablet Take 1,000 Units by mouth daily.     clopidogrel  (PLAVIX ) 75 MG tablet TAKE 1 TABLET BY MOUTH EVERY DAY 90 tablet 2   Coenzyme Q10 (COQ10 PO) Take 1 capsule by mouth daily.     EPINEPHrine 0.3 mg/0.3 mL IJ SOAJ injection See admin instructions.     Evolocumab  (REPATHA  SURECLICK) 140 MG/ML SOAJ Inject 140 mg into the skin every 14 (fourteen) days. 6 mL 3   fluticasone (FLONASE) 50 MCG/ACT nasal spray Place 1 spray into both nostrils as needed for allergies.   2   GEMTESA 75 MG TABS Take 75 mg by mouth daily.     isosorbide  mononitrate (IMDUR ) 30 MG 24 hr tablet Take 1 tablet (30 mg total) by mouth daily. 30 tablet 0   levocetirizine (XYZAL ) 5 MG tablet Take 1 tablet by mouth daily.     Multiple Vitamin  (MULTIVITAMIN WITH MINERALS) TABS Take 1 tablet by mouth daily.     nitroGLYCERIN  (NITROSTAT ) 0.4 MG SL tablet PLACE 1 TABLET UNDER THE TONGUE EVERY 5 MINUTES AS NEEDED FOR CHEST PAIN. 25 tablet 8   Psyllium 30.9 % POWD Take by mouth 2 (two) times daily.     rosuvastatin  (CRESTOR ) 10 MG tablet Take 1 tablet (10 mg total) by mouth daily. 90 tablet 2   VASCEPA  1 g capsule TAKE 2 CAPSULES BY MOUTH 2 TIMES DAILY. 360 capsule 3   vitamin B-12 (CYANOCOBALAMIN ) 1000 MCG tablet Take 1,000 mcg by mouth daily.     vitamin C  (ASCORBIC ACID ) 500 MG tablet Take 500 mg by mouth daily.     No current facility-administered medications for this visit.    Allergies  Allergen Reactions   Other Hives, Itching and Swelling    VICRYL STITCHES Tongue swelling Other reaction(s): Unknown   Fluoride Preparations Other (See Comments)    A test for eye to make pictures, (  causing pressure of Left arm )   Rosuvastatin  Other (See Comments)    Leg pains, cramping    Social History   Socioeconomic History   Marital status: Married    Spouse name: Not on file   Number of children: 0   Years of education: Not on file   Highest education level: Not on file  Occupational History   Occupation: retired     Associate Professor: RETIRED  Tobacco Use   Smoking status: Never   Smokeless tobacco: Never  Vaping Use   Vaping status: Never Used  Substance and Sexual Activity   Alcohol  use: Yes    Alcohol /week: 2.0 - 3.0 standard drinks of alcohol     Types: 2 - 3 Standard drinks or equivalent per week    Comment: 1 per day   Drug use: No   Sexual activity: Not on file  Other Topics Concern   Not on file  Social History Narrative   Not on file   Social Drivers of Health   Financial Resource Strain: Not on file  Food Insecurity: No Food Insecurity (02/20/2024)   Hunger Vital Sign    Worried About Running Out of Food in the Last Year: Never true    Ran Out of Food in the Last Year: Never true  Transportation Needs: No  Transportation Needs (02/20/2024)   PRAPARE - Administrator, Civil Service (Medical): No    Lack of Transportation (Non-Medical): No  Physical Activity: Not on file  Stress: Not on file  Social Connections: Patient Declined (02/20/2024)   Social Connection and  Isolation Panel    Frequency of Communication with Friends and Family: Patient declined    Frequency of Social Gatherings with Friends and Family: Patient declined    Attends Religious Services: Patient declined    Database administrator or Organizations: Patient declined    Attends Banker Meetings: Patient declined    Marital Status: Patient declined  Intimate Partner Violence: Not At Risk (02/20/2024)   Humiliation, Afraid, Rape, and Kick questionnaire    Fear of Current or Ex-Partner: No    Emotionally Abused: No    Physically Abused: No    Sexually Abused: No    Family History  Problem Relation Age of Onset   Colon polyps Brother    Heart disease Brother        before age 41   Hyperlipidemia Brother    Prostate cancer Brother    Heart disease Father        before age 24   Deep vein thrombosis Father    Hyperlipidemia Father    Heart attack Father 42   Stroke Father    Heart disease Brother    Heart attack Brother    Cancer Brother        Merkle cell   Stroke Mother        TIA's   Dementia Mother    Ovarian cancer Maternal Grandmother    Colon polyps Paternal Grandfather    Liver cancer Paternal Grandfather    Heart disease Other        Grandmother    Colon cancer Neg Hx    Esophageal cancer Neg Hx    Stomach cancer Neg Hx    Rectal cancer Neg Hx     Review of Systems:  As stated in the HPI and otherwise negative.   BP (!) 140/100   Pulse 77   Ht 5' 2 (1.575 m)   Wt 154 lb 12.8 oz (70.2 kg)   SpO2 98%   BMI 28.31 kg/m   Physical Examination: General: Well developed, well nourished, NAD  HEENT: OP clear, mucus membranes moist  SKIN: warm, dry. No rashes. Neuro: No focal  deficits  Musculoskeletal: Muscle strength 5/5 all ext  Psychiatric: Mood and affect normal  Neck: No JVD, no carotid bruits, no thyromegaly, no lymphadenopathy.  Lungs:Clear bilaterally, no wheezes, rhonci, crackles Cardiovascular: Regular rate and rhythm. No murmurs, gallops or rubs. Abdomen:Soft. Bowel sounds present. Non-tender.  Extremities: No lower extremity edema. Pulses are 2 + in the bilateral DP/PT.  EKG:  EKG is not ordered today. The ekg ordered today demonstrates   Recent Labs: 02/20/2024: ALT 18 02/21/2024: BUN 11; Creatinine, Ser 0.80; Hemoglobin 14.4; Magnesium 2.3; Platelets 368; Potassium 3.8; Sodium 140   Lipid Panel Followed in primary care   Wt Readings from Last 3 Encounters:  02/25/24 154 lb 12.8 oz (70.2 kg)  02/21/24 151 lb 11.2 oz (68.8 kg)  10/28/23 157 lb (71.2 kg)    Assessment and Plan:   1. CAD with unstable angina: She has had stents placed in the LAD, Diagonal and RCA. Last cath in June 2013 with stable CAD.  Nuclear stress test February 2023 with no ischemia. She is now having left shoulder pain with exertion that she says is similar to her prior angina.  Cardiac cath is indicated. Will plan cardiac cath at [facility] on 03/10/24 at 8:30 am.   I have reviewed the risks, indications, and alternatives to cardiac catheterization, possible angioplasty, and stenting with the patient. Risks include  but are not limited to bleeding, infection, vascular injury, stroke, myocardial infection, arrhythmia, kidney injury, radiation-related injury in the case of prolonged fluoroscopy use, emergency cardiac surgery, and death. The patient understands the risks of serious complication is 1-2 in 1000 with diagnostic cardiac cath and 1-2% or less with angioplasty/stenting. Continue Plavix  and statin. Start ASA 81 mg daily on day of cath.     2. HTN: BP is well controlled when she checks her BP at the pharmacy. No changes today.   3. HYPERLIPIDEMIA: Lipids followed in primary  care. LDL 23 in September 2025. Continue statin and Repatha .     4. Carotid artery disease: Moderate RICA stenosis by dopplers in May 2025. Followed in [facility]  5. Cardiac murmur: Noted in 2023. No valve disease noted by echo in 2023.   Labs/ tests ordered today include:  No orders of the defined types were placed in this encounter.  Disposition:   F/U with me or office APP several weeks post cath.   Signed, [name], MD 02/25/2024 3:21 PM    [facility] [address] [place], [place], [place]  [address] Phone: [phone]; Fax: [phone]

## 2024-03-05 ENCOUNTER — Ambulatory Visit: Admitting: Physician Assistant

## 2024-03-09 ENCOUNTER — Telehealth: Payer: Self-pay | Admitting: *Deleted

## 2024-03-09 NOTE — Telephone Encounter (Signed)
 Cardiac Catheterization scheduled at [facility] for: Wednesday March 10, 2024 8:30 AM Arrival time [facility] Main Entrance A at: 6:30 AM  Diet: -Nothing to eat after midnight.  Hydration: -May drink clear liquids until 2 hours before the procedure.  Approved liquids: Water , clear tea, black coffee, fruit juices-non-citric and without pulp,Gatorade, plain Jello/popsicles.   -Please drink 16 oz of water  2 hours before procedure.  Medication instructions: -Usual morning medications can be taken including aspirin  81 mg and Plavix  75 mg.  Plan to go home the same day, you will only stay overnight if medically necessary.  You must have responsible adult to drive you home.  Someone must be with you the first 24 hours after you arrive home.  Reviewed procedure instructions with patient.

## 2024-03-10 ENCOUNTER — Ambulatory Visit (HOSPITAL_COMMUNITY)
Admission: RE | Admit: 2024-03-10 | Discharge: 2024-03-11 | Disposition: A | Discharge status: Home / Self Care | Attending: Cardiovascular Disease | Admitting: Cardiovascular Disease

## 2024-03-10 ENCOUNTER — Encounter (HOSPITAL_COMMUNITY): Payer: Self-pay | Admitting: Cardiovascular Disease

## 2024-03-10 ENCOUNTER — Other Ambulatory Visit: Payer: Self-pay

## 2024-03-10 ENCOUNTER — Ambulatory Visit (HOSPITAL_COMMUNITY)
Admission: RE | Disposition: A | Discharge status: Home / Self Care | Payer: Self-pay | Source: Home / Self Care | Attending: Cardiovascular Disease

## 2024-03-10 DIAGNOSIS — I10 Essential (primary) hypertension: Secondary | ICD-10-CM | POA: Diagnosis present

## 2024-03-10 DIAGNOSIS — E785 Hyperlipidemia, unspecified: Secondary | ICD-10-CM | POA: Diagnosis not present

## 2024-03-10 DIAGNOSIS — I200 Unstable angina: Secondary | ICD-10-CM | POA: Diagnosis present

## 2024-03-10 DIAGNOSIS — Z955 Presence of coronary angioplasty implant and graft: Secondary | ICD-10-CM

## 2024-03-10 DIAGNOSIS — Z006 Encounter for examination for normal comparison and control in clinical research program: Secondary | ICD-10-CM

## 2024-03-10 DIAGNOSIS — Z7902 Long term (current) use of antithrombotics/antiplatelets: Secondary | ICD-10-CM | POA: Diagnosis not present

## 2024-03-10 DIAGNOSIS — I2510 Atherosclerotic heart disease of native coronary artery without angina pectoris: Secondary | ICD-10-CM | POA: Diagnosis present

## 2024-03-10 DIAGNOSIS — I6529 Occlusion and stenosis of unspecified carotid artery: Secondary | ICD-10-CM | POA: Diagnosis present

## 2024-03-10 DIAGNOSIS — I25110 Atherosclerotic heart disease of native coronary artery with unstable angina pectoris: Secondary | ICD-10-CM | POA: Diagnosis not present

## 2024-03-10 DIAGNOSIS — I6521 Occlusion and stenosis of right carotid artery: Secondary | ICD-10-CM | POA: Diagnosis not present

## 2024-03-10 DIAGNOSIS — Z7982 Long term (current) use of aspirin: Secondary | ICD-10-CM | POA: Diagnosis not present

## 2024-03-10 HISTORY — PX: LEFT HEART CATH AND CORONARY ANGIOGRAPHY: CATH118249

## 2024-03-10 HISTORY — PX: CORONARY STENT INTERVENTION: CATH118234

## 2024-03-10 LAB — POCT ACTIVATED CLOTTING TIME
Activated Clotting Time: 222 s
Activated Clotting Time: 193 s
Activated Clotting Time: 170 s

## 2024-03-10 LAB — BASIC METABOLIC PANEL WITH GFR
Anion gap: 7 (ref 5–15)
BUN: 14 mg/dL (ref 8–23)
CO2: 23 mmol/L (ref 22–32)
Calcium: 8.7 mg/dL — ABNORMAL LOW (ref 8.9–10.3)
Chloride: 106 mmol/L (ref 98–111)
Creatinine, Ser: 0.87 mg/dL (ref 0.44–1.00)
GFR, Estimated: >60 mL/min (ref >60)
Glucose, Bld: 93 mg/dL (ref 70–99)
Potassium: 4.3 mmol/L (ref 3.5–5.1)
Sodium: 136 mmol/L (ref 135–145)

## 2024-03-10 SURGERY — LEFT HEART CATH AND CORONARY ANGIOGRAPHY
Anesthesia: LOCAL

## 2024-03-10 MED ORDER — ONDANSETRON HCL 4 MG/2ML IJ SOLN: 4.0000 mg | Freq: Four times a day (QID) | INTRAMUSCULAR | Status: DC | PRN

## 2024-03-10 MED ORDER — HEPARIN (PORCINE) IN NACL 1000-0.9 UT/500ML-% IV SOLN
INTRAVENOUS | Status: DC | PRN
  Administered 2024-03-10 (×3): 500 mL

## 2024-03-10 MED ORDER — MIRABEGRON ER 25 MG PO TB24
25.0000 mg | ORAL_TABLET | Freq: Every day | ORAL | Status: DC
  Administered 2024-03-10: 25 mg via ORAL
  Filled 2024-03-10 (×3): qty 1

## 2024-03-10 MED ORDER — FENTANYL CITRATE (PF) 100 MCG/2ML IJ SOLN
INTRAMUSCULAR | Status: AC
  Filled 2024-03-10: qty 2

## 2024-03-10 MED ORDER — FREE WATER: 500.0000 mL | Freq: Once | Status: DC

## 2024-03-10 MED ORDER — SODIUM CHLORIDE 0.9 % IV SOLN: 250.0000 mL | INTRAVENOUS | Status: DC | PRN

## 2024-03-10 MED ORDER — SODIUM CHLORIDE 0.9% FLUSH
3.0000 mL | Freq: Two times a day (BID) | INTRAVENOUS | Status: DC
  Administered 2024-03-10 – 2024-03-11 (×3): 3 mL via INTRAVENOUS

## 2024-03-10 MED ORDER — NITROGLYCERIN 0.4 MG SL SUBL: 0.4000 mg | SUBLINGUAL_TABLET | SUBLINGUAL | Status: DC | PRN

## 2024-03-10 MED ORDER — HEPARIN SODIUM (PORCINE) 1000 UNIT/ML IJ SOLN
INTRAMUSCULAR | Status: DC | PRN
  Administered 2024-03-10: 10000 [IU] via INTRAVENOUS

## 2024-03-10 MED ORDER — ROSUVASTATIN CALCIUM 20 MG PO TABS
10.0000 mg | ORAL_TABLET | Freq: Every evening | ORAL | Status: DC
Start: 2024-03-10 — End: 2024-03-11
  Administered 2024-03-10: 10 mg via ORAL
  Filled 2024-03-10 (×2): qty 1

## 2024-03-10 MED ORDER — HYDRALAZINE HCL 20 MG/ML IJ SOLN: 10.0000 mg | INTRAMUSCULAR | Status: AC | PRN

## 2024-03-10 MED ORDER — LIDOCAINE HCL (PF) 1 % IJ SOLN
INTRAMUSCULAR | Status: AC
Start: 2024-03-10 — End: 2024-03-10
  Filled 2024-03-10: qty 30

## 2024-03-10 MED ORDER — CLOPIDOGREL BISULFATE 75 MG PO TABS
75.0000 mg | ORAL_TABLET | Freq: Every day | ORAL | Status: DC
  Administered 2024-03-11: 75 mg via ORAL
  Filled 2024-03-10: qty 1

## 2024-03-10 MED ORDER — ASPIRIN 81 MG PO CHEW
81.0000 mg | CHEWABLE_TABLET | Freq: Every day | ORAL | Status: DC
  Administered 2024-03-11: 81 mg via ORAL
  Filled 2024-03-10: qty 1

## 2024-03-10 MED ORDER — ASPIRIN 81 MG PO CHEW: 81.0000 mg | CHEWABLE_TABLET | ORAL | Status: DC

## 2024-03-10 MED ORDER — MAGNESIUM OXIDE -MG SUPPLEMENT 400 (240 MG) MG PO TABS
400.0000 mg | ORAL_TABLET | Freq: Every evening | ORAL | Status: DC
Start: 2024-03-10 — End: 2024-03-11
  Administered 2024-03-10: 400 mg via ORAL
  Filled 2024-03-10: qty 1

## 2024-03-10 MED ORDER — VERAPAMIL HCL 2.5 MG/ML IV SOLN
INTRAVENOUS | Status: AC
  Filled 2024-03-10: qty 2

## 2024-03-10 MED ORDER — AMLODIPINE BESYLATE 5 MG PO TABS
2.5000 mg | ORAL_TABLET | Freq: Every evening | ORAL | Status: DC
Start: 2024-03-10 — End: 2024-03-11
  Administered 2024-03-10: 2.5 mg via ORAL
  Filled 2024-03-10: qty 1

## 2024-03-10 MED ORDER — FREE WATER
500.0000 mL | Freq: Once | Status: AC
Start: 2024-03-10 — End: 2024-03-10
  Administered 2024-03-10: 500 mL via ORAL

## 2024-03-10 MED ORDER — ACETAMINOPHEN 325 MG PO TABS
650.0000 mg | ORAL_TABLET | ORAL | Status: DC | PRN
  Administered 2024-03-10: 650 mg via ORAL
  Filled 2024-03-10: qty 2

## 2024-03-10 MED ORDER — SODIUM CHLORIDE 0.9% FLUSH: 3.0000 mL | INTRAVENOUS | Status: DC | PRN

## 2024-03-10 MED ORDER — ACETAMINOPHEN 325 MG PO TABS
ORAL_TABLET | ORAL | Status: AC
  Administered 2024-03-10: 650 mg via ORAL
  Filled 2024-03-10: qty 2

## 2024-03-10 MED ORDER — HEPARIN SODIUM (PORCINE) 1000 UNIT/ML IJ SOLN
INTRAMUSCULAR | Status: AC
Start: 2024-03-10 — End: 2024-03-10
  Filled 2024-03-10: qty 10

## 2024-03-10 MED ORDER — MIDAZOLAM HCL 2 MG/2ML IJ SOLN
INTRAMUSCULAR | Status: DC | PRN
  Administered 2024-03-10 (×2): 1 mg via INTRAVENOUS

## 2024-03-10 MED ORDER — LABETALOL HCL 5 MG/ML IV SOLN: 10.0000 mg | INTRAVENOUS | Status: AC | PRN

## 2024-03-10 MED ORDER — IOHEXOL 350 MG/ML SOLN
INTRAVENOUS | Status: DC | PRN
  Administered 2024-03-10: 90 mL

## 2024-03-10 MED ORDER — SODIUM CHLORIDE 0.9 % IV SOLN: 250.0000 mL | INTRAVENOUS | Status: AC | PRN

## 2024-03-10 MED ORDER — LEVOCETIRIZINE DIHYDROCHLORIDE 5 MG PO TABS
5.0000 mg | ORAL_TABLET | Freq: Every evening | ORAL | Status: DC
Start: 2024-03-10 — End: 2024-03-10

## 2024-03-10 MED ORDER — MIDAZOLAM HCL 2 MG/2ML IJ SOLN
INTRAMUSCULAR | Status: AC
  Filled 2024-03-10: qty 2

## 2024-03-10 MED ORDER — LORATADINE 10 MG PO TABS
10.0000 mg | ORAL_TABLET | Freq: Every evening | ORAL | Status: DC
  Administered 2024-03-10: 10 mg via ORAL
  Filled 2024-03-10: qty 1

## 2024-03-10 MED ORDER — FENTANYL CITRATE (PF) 100 MCG/2ML IJ SOLN
INTRAMUSCULAR | Status: DC | PRN
  Administered 2024-03-10 (×2): 25 ug via INTRAVENOUS

## 2024-03-10 MED ORDER — LIDOCAINE HCL (PF) 1 % IJ SOLN
INTRAMUSCULAR | Status: DC | PRN
  Administered 2024-03-10: 5 mL via INTRADERMAL
  Administered 2024-03-10: 2 mL via INTRADERMAL

## 2024-03-10 MED ORDER — POLYETHYLENE GLYCOL 3350 17 G PO PACK
17.0000 g | PACK | Freq: Every evening | ORAL | Status: DC
  Administered 2024-03-10: 17 g via ORAL
  Filled 2024-03-10: qty 1

## 2024-03-10 MED ORDER — SODIUM CHLORIDE 0.9% FLUSH
3.0000 mL | Freq: Two times a day (BID) | INTRAVENOUS | Status: DC
  Administered 2024-03-10: 3 mL via INTRAVENOUS

## 2024-03-10 SURGICAL SUPPLY — 16 items
BALLOON EMERGE MR 2.0X12 (BALLOONS) IMPLANT
BALLOON ~~LOC~~ EMERGE MR 2.5X15 (BALLOONS) IMPLANT
CATH INFINITI 5FR MULTPACK ANG (CATHETERS) IMPLANT
CATH VISTA GUIDE 6FR XBLD 3.5 (CATHETERS) IMPLANT
GLIDESHEATH SLEND SS 6F .021 (SHEATH) IMPLANT
GUIDEWIRE INQWIRE 1.5J.035X260 (WIRE) IMPLANT
KIT ENCORE 26 ADVANTAGE (KITS) IMPLANT
KIT SYRINGE INJ CVI SPIKEX1 (MISCELLANEOUS) IMPLANT
PACK CARDIAC CATHETERIZATION (CUSTOM PROCEDURE TRAY) ×1 IMPLANT
SET ATX-X65L (MISCELLANEOUS) IMPLANT
SHEATH PINNACLE 5F 10CM (SHEATH) IMPLANT
SHEATH PINNACLE 6F 10CM (SHEATH) IMPLANT
SHEATH PROBE COVER 6X72 (BAG) IMPLANT
STENT SYNERGY XD 2.25X28 (Permanent Stent) IMPLANT
WIRE ASAHI PROWATER 180CM (WIRE) IMPLANT
WIRE MICRO SET SILHO 5FR 7 (SHEATH) IMPLANT

## 2024-03-10 NOTE — Research (Cosign Needed Addendum)
 Prevail Informed Consent   Subject Name: [name]  Subject met inclusion and exclusion criteria.  The informed consent form, study requirements and expectations were reviewed with the subject and questions and concerns were addressed prior to the signing of the consent form.  The subject verbalized understanding of the trial requirements.  The subject agreed to participate in the Prevail trial and signed the informed consent at 0700 on 03/10/2024.  The informed consent was obtained prior to performance of any protocol-specific procedures for the subject.  A copy of the signed informed consent was given to the subject and a copy was placed in the subject's medical record.   [name]   Screen Fail

## 2024-03-10 NOTE — Progress Notes (Signed)
 ACT 170. 33fr sheath removed intact. Manual pressure applied x 30 min per Therisa, RN. No bleeding or hematoma noted. Post activity and precautions explained. Will continue to monitor per orders and protocol.Vinicio Lynk E

## 2024-03-10 NOTE — Interval H&P Note (Signed)
 History and Physical Interval Note:  03/10/2024 8:13 AM  [name]  has presented today for surgery, with the diagnosis of chest pain.  The various methods of treatment have been discussed with the patient and family. After consideration of risks, benefits and other options for treatment, the patient has consented to  Procedure(s): LEFT HEART CATH AND CORONARY ANGIOGRAPHY (N/A) as a surgical intervention.  The patient's history has been reviewed, patient examined, no change in status, stable for surgery.  I have reviewed the patient's chart and labs.  Questions were answered to the patient's satisfaction.    Cath Lab Visit (complete for each Cath Lab visit)  Clinical Evaluation Leading to the Procedure:   ACS: No.  Non-ACS:    Anginal Classification: CCS III  Anti-ischemic medical therapy: Maximal Therapy (2 or more classes of medications)  Non-Invasive Test Results: No non-invasive testing performed  Prior CABG: No previous CABG        [name]

## 2024-03-11 ENCOUNTER — Encounter (HOSPITAL_COMMUNITY): Payer: Self-pay | Admitting: Cardiovascular Disease

## 2024-03-11 DIAGNOSIS — Z7982 Long term (current) use of aspirin: Secondary | ICD-10-CM | POA: Diagnosis not present

## 2024-03-11 DIAGNOSIS — I25110 Atherosclerotic heart disease of native coronary artery with unstable angina pectoris: Secondary | ICD-10-CM | POA: Diagnosis not present

## 2024-03-11 DIAGNOSIS — Z7902 Long term (current) use of antithrombotics/antiplatelets: Secondary | ICD-10-CM | POA: Diagnosis not present

## 2024-03-11 DIAGNOSIS — I10 Essential (primary) hypertension: Secondary | ICD-10-CM | POA: Diagnosis not present

## 2024-03-11 DIAGNOSIS — I6521 Occlusion and stenosis of right carotid artery: Secondary | ICD-10-CM | POA: Diagnosis not present

## 2024-03-11 DIAGNOSIS — E785 Hyperlipidemia, unspecified: Secondary | ICD-10-CM | POA: Diagnosis not present

## 2024-03-11 LAB — POCT ACTIVATED CLOTTING TIME
Activated Clotting Time: 320 s
Activated Clotting Time: 239 s

## 2024-03-11 LAB — CBC
HCT: 41.8 % (ref 36.0–46.0)
Hemoglobin: 13.6 g/dL (ref 12.0–15.0)
MCH: 29.6 pg (ref 26.0–34.0)
MCHC: 32.5 g/dL (ref 30.0–36.0)
MCV: 91.1 fL (ref 80.0–100.0)
Platelets: 354 K/uL (ref 150–400)
RBC: 4.59 MIL/uL (ref 3.87–5.11)
RDW: 12.6 % (ref 11.5–15.5)
WBC: 17.2 K/uL — ABNORMAL HIGH (ref 4.0–10.5)
nRBC: 0 % (ref 0.0–0.2)

## 2024-03-11 LAB — BASIC METABOLIC PANEL WITH GFR
Anion gap: 12 (ref 5–15)
BUN: 14 mg/dL (ref 8–23)
CO2: 19 mmol/L — ABNORMAL LOW (ref 22–32)
Calcium: 8.7 mg/dL — ABNORMAL LOW (ref 8.9–10.3)
Chloride: 107 mmol/L (ref 98–111)
Creatinine, Ser: 0.81 mg/dL (ref 0.44–1.00)
GFR, Estimated: >60 mL/min (ref >60)
Glucose, Bld: 86 mg/dL (ref 70–99)
Potassium: 3.9 mmol/L (ref 3.5–5.1)
Sodium: 138 mmol/L (ref 135–145)

## 2024-03-11 MED ORDER — ASPIRIN 81 MG PO TBEC: 81.0000 mg | DELAYED_RELEASE_TABLET | Freq: Every day | ORAL | Status: AC

## 2024-03-11 MED FILL — Verapamil HCl IV Soln 2.5 MG/ML: INTRAVENOUS | Qty: 2 | Status: AC

## 2024-03-11 NOTE — Discharge Instructions (Addendum)
 Post Cardiac Catheterization: NO HEAVY LIFTING OR SEXUAL ACTIVITY X 7 DAYS. NO DRIVING X 3-5 DAYS. NO SOAKING BATHS, HOT TUBS, POOLS, ETC., X 7 DAYS.   Radial Site Care: Refer to this sheet in the next few weeks. These instructions provide you with information on caring for yourself after your procedure. Your caregiver may also give you more specific instructions. Your treatment has been planned according to current medical practices, but problems sometimes occur. Call your caregiver if you have any problems or questions after your procedure. HOME CARE INSTRUCTIONS You may shower the day after the procedure. Remove the bandage (dressing) and gently wash the site with plain soap and water . Gently pat the site dry.  Do not apply powder or lotion to the site.  Do not submerge the affected site in water  for 3 to 5 days.  Inspect the site at least twice daily.  Do not flex or bend the affected arm for 24 hours.  No lifting over 5 pounds (2.3 kg) for 5 days after your procedure.  Do not drive home if you are discharged the same day of the procedure. Have someone else drive you.  What to expect: Any bruising will usually fade within 1 to 2 weeks.  Blood that collects in the tissue (hematoma) may be painful to the touch. It should usually decrease in size and tenderness within 1 to 2 weeks.  SEEK IMMEDIATE MEDICAL CARE IF: You have unusual pain at the radial site.  You have redness, warmth, swelling, or pain at the radial site.  You have drainage (other than a small amount of blood on the dressing).  You have chills.  You have a fever or persistent symptoms for more than 72 hours.  You have a fever and your symptoms suddenly get worse.  Your arm becomes pale, cool, tingly, or numb.  You have heavy bleeding from the site. Hold pressure on the site.   Groin Site Care Refer to this sheet in the next few weeks. These instructions provide you with information on caring for yourself after your  procedure. Your caregiver may also give you more specific instructions. Your treatment has been planned according to current medical practices, but problems sometimes occur. Call your caregiver if you have any problems or questions after your procedure. HOME CARE INSTRUCTIONS You may shower 24 hours after the procedure. Remove the bandage (dressing) and gently wash the site with plain soap and water . Gently pat the site dry.  Do not apply powder or lotion to the site.  Do not sit in a bathtub, swimming pool, or whirlpool for 5 to 7 days.  No bending, squatting, or lifting anything over 10 pounds (4.5 kg) as directed by your caregiver.  Inspect the site at least twice daily.  Do not drive home if you are discharged the same day of the procedure. Have someone else drive you.  What to expect: Any bruising will usually fade within 1 to 2 weeks.  Blood that collects in the tissue (hematoma) may be painful to the touch. It should usually decrease in size and tenderness within 1 to 2 weeks.  SEEK IMMEDIATE MEDICAL CARE IF: You have unusual pain at the groin site or down the affected leg.  You have redness, warmth, swelling, or pain at the groin site.  You have drainage (other than a small amount of blood on the dressing).  You have chills.  You have a fever or persistent symptoms for more than 72 hours.  You have  a fever and your symptoms suddenly get worse.  Your leg becomes pale, cool, tingly, or numb.  You have heavy bleeding from the site. Hold pressure on the site.   Information about your medication: Plavix  (anti-platelet agent)  Generic Name (Brand): clopidogrel  (Plavix ), once daily medication  PURPOSE: You are taking this medication along with aspirin  to lower your chance of having a heart attack, stroke, or blood clots in your heart stent. These can be fatal. Plavix  and aspirin  help prevent platelets from sticking together and forming a clot that can block an artery or your stent.    Common SIDE EFFECTS you may experience include: bruising or bleeding more easily, shortness of breath  Do not stop taking PLAVIX  without talking to the doctor who prescribes it for you. People who are treated with a stent and stop taking Plavix  too soon, have a higher risk of getting a blood clot in the stent, having a heart attack, or dying. If you stop Plavix  because of bleeding, or for other reasons, your risk of a heart attack or stroke may increase.   Avoid taking NSAID agents or anti-inflammatory medications such as ibuprofen, naproxen given increased bleed risk with plavix  - can use acetaminophen  (Tylenol ) if needed for pain.  Avoid taking over the counter stomach medications omeprazole (Prilosec) or esomeprazole (Nexium) since these do interact and make plavix  less effective - ask your pharmacist or doctor for alterative agents if needed for heartburn or GERD.   Tell all of your doctors and dentists that you are taking Plavix . They should talk to the doctor who prescribed Plavix  for you before you have any surgery or invasive procedure.   Contact your health care provider if you experience: severe or uncontrollable bleeding, pink/red/brown urine, vomiting blood or vomit that looks like coffee grounds, red or black stools (looks like tar), coughing up blood or blood clots ----------------------------------------------------------------------------------------------------------------------

## 2024-03-11 NOTE — Plan of Care (Signed)

## 2024-03-11 NOTE — Plan of Care (Signed)
 Problem: Education: Goal: Understanding of CV disease, CV risk reduction, and recovery process will improve 03/11/2024 0948 by Mila Fairy SAUNDERS, RN Outcome: Adequate for Discharge 03/11/2024 0729 by Mila Fairy SAUNDERS, RN Outcome: Progressing Goal: Individualized Educational Video(s) 03/11/2024 0948 by Mila Fairy SAUNDERS, RN Outcome: Adequate for Discharge 03/11/2024 0729 by Mila Fairy SAUNDERS, RN Outcome: Progressing   Problem: Activity: Goal: Ability to return to baseline activity level will improve 03/11/2024 0948 by Mila Fairy SAUNDERS, RN Outcome: Adequate for Discharge 03/11/2024 0729 by Mila Fairy SAUNDERS, RN Outcome: Progressing   Problem: Cardiovascular: Goal: Ability to achieve and maintain adequate cardiovascular perfusion will improve 03/11/2024 0948 by Mila Fairy SAUNDERS, RN Outcome: Adequate for Discharge 03/11/2024 0729 by Mila Fairy SAUNDERS, RN Outcome: Progressing Goal: Vascular access site(s) Level 0-1 will be maintained 03/11/2024 0948 by Mila Fairy SAUNDERS, RN Outcome: Adequate for Discharge 03/11/2024 0729 by Mila Fairy SAUNDERS, RN Outcome: Progressing   Problem: Health Behavior/Discharge Planning: Goal: Ability to safely manage health-related needs after discharge will improve 03/11/2024 0948 by Mila Fairy SAUNDERS, RN Outcome: Adequate for Discharge 03/11/2024 0729 by Mila Fairy SAUNDERS, RN Outcome: Progressing   Problem: Education: Goal: Knowledge of General Education information will improve Description: Including pain rating scale, medication(s)/side effects and non-pharmacologic comfort measures 03/11/2024 0948 by Mila Fairy SAUNDERS, RN Outcome: Adequate for Discharge 03/11/2024 0729 by Mila Fairy SAUNDERS, RN Outcome: Progressing   Problem: Health Behavior/Discharge Planning: Goal: Ability to manage health-related needs will improve 03/11/2024 0948 by Mila Fairy SAUNDERS, RN Outcome: Adequate for Discharge 03/11/2024 0729 by Mila Fairy SAUNDERS, RN Outcome: Progressing    Problem: Clinical Measurements: Goal: Ability to maintain clinical measurements within normal limits will improve 03/11/2024 0948 by Mila Fairy SAUNDERS, RN Outcome: Adequate for Discharge 03/11/2024 0729 by Mila Fairy SAUNDERS, RN Outcome: Progressing Goal: Will remain free from infection 03/11/2024 0948 by Mila Fairy SAUNDERS, RN Outcome: Adequate for Discharge 03/11/2024 0729 by Mila Fairy SAUNDERS, RN Outcome: Progressing Goal: Diagnostic test results will improve 03/11/2024 0948 by Mila Fairy SAUNDERS, RN Outcome: Adequate for Discharge 03/11/2024 0729 by Mila Fairy SAUNDERS, RN Outcome: Progressing Goal: Respiratory complications will improve 03/11/2024 0948 by Mila Fairy SAUNDERS, RN Outcome: Adequate for Discharge 03/11/2024 0729 by Mila Fairy SAUNDERS, RN Outcome: Progressing Goal: Cardiovascular complication will be avoided 03/11/2024 0948 by Mila Fairy SAUNDERS, RN Outcome: Adequate for Discharge 03/11/2024 0729 by Mila Fairy SAUNDERS, RN Outcome: Progressing   Problem: Activity: Goal: Risk for activity intolerance will decrease 03/11/2024 0948 by Mila Fairy SAUNDERS, RN Outcome: Adequate for Discharge 03/11/2024 0729 by Mila Fairy SAUNDERS, RN Outcome: Progressing   Problem: Nutrition: Goal: Adequate nutrition will be maintained 03/11/2024 0948 by Mila Fairy SAUNDERS, RN Outcome: Adequate for Discharge 03/11/2024 0729 by Mila Fairy SAUNDERS, RN Outcome: Progressing   Problem: Coping: Goal: Level of anxiety will decrease 03/11/2024 0948 by Mila Fairy SAUNDERS, RN Outcome: Adequate for Discharge 03/11/2024 0729 by Mila Fairy SAUNDERS, RN Outcome: Progressing   Problem: Elimination: Goal: Will not experience complications related to bowel motility 03/11/2024 0948 by Mila Fairy SAUNDERS, RN Outcome: Adequate for Discharge 03/11/2024 0729 by Mila Fairy SAUNDERS, RN Outcome: Progressing Goal: Will not experience complications related to urinary retention 03/11/2024 0948 by Mila Fairy SAUNDERS, RN Outcome:  Adequate for Discharge 03/11/2024 0729 by Mila Fairy SAUNDERS, RN Outcome: Progressing   Problem: Pain Managment: Goal: General experience of comfort will improve and/or be controlled 03/11/2024 0948 by Mila Fairy SAUNDERS, RN Outcome: Adequate for Discharge 03/11/2024 0729 by Mila Fairy SAUNDERS, RN Outcome: Progressing  Problem: Safety: Goal: Ability to remain free from injury will improve 03/11/2024 0948 by Mila Fairy SAUNDERS, RN Outcome: Adequate for Discharge 03/11/2024 0729 by Mila Fairy SAUNDERS, RN Outcome: Progressing   Problem: Skin Integrity: Goal: Risk for impaired skin integrity will decrease 03/11/2024 0948 by Mila Fairy SAUNDERS, RN Outcome: Adequate for Discharge 03/11/2024 0729 by Mila Fairy SAUNDERS, RN Outcome: Progressing

## 2024-03-11 NOTE — Plan of Care (Signed)
  Problem: Activity: Goal: Ability to return to baseline activity level will improve Outcome: Progressing   Problem: Cardiovascular: Goal: Ability to achieve and maintain adequate cardiovascular perfusion will improve Outcome: Progressing   Problem: Health Behavior/Discharge Planning: Goal: Ability to safely manage health-related needs after discharge will improve Outcome: Progressing   Problem: Clinical Measurements: Goal: Respiratory complications will improve Outcome: Progressing Goal: Cardiovascular complication will be avoided Outcome: Progressing   Problem: Activity: Goal: Risk for activity intolerance will decrease Outcome: Progressing   Problem: Nutrition: Goal: Adequate nutrition will be maintained Outcome: Progressing   Problem: Safety: Goal: Ability to remain free from injury will improve Outcome: Progressing

## 2024-03-11 NOTE — Progress Notes (Signed)
 CARDIAC REHAB PHASE I     Post stent education including site care, restrictions, risk factors, exercise guidelines, NTG use, antiplatelet therapy importance, heart healthy diet, and CRP2 reviewed. All questions and concerns addressed. Will refer to [facility]  for CRP2. Plan for home later today.   [id number]  Fairy JONETTA Music, RN BSN 03/11/2024 9:42 AM

## 2024-03-11 NOTE — Discharge Summary (Signed)
 Discharge Summary   Patient ID: [name] MRN: [id number]; DOB: 1940/12/11  Admit date: 03/10/2024 Discharge date: 03/11/2024  PCP:  Julie Rush, MD   [facility] Providers Cardiologist:  Julie Cash, MD       Discharge Diagnoses  Principal Problem:   Unstable angina [facility]) Active Problems:   Hyperlipidemia   CAD   HTN (hypertension)   Carotid stenosis   Diagnostic Studies/Procedures   Left Heart Catheterization 03/10/2204:   Prox RCA lesion is 20% stenosed.   Mid Cx lesion is 99% stenosed.   1st Diag lesion is 20% stenosed.   Mid LAD-1 lesion is 80% stenosed.   Mid LAD-2 lesion is 80% stenosed.   A drug-eluting stent was successfully placed using a STENT SYNERGY XD 2.25X28.   Post intervention, there is a 0% residual stenosis.   Post intervention, there is a 0% residual stenosis.   Severe restenosis mid LAD stented segment. Severe stenosis mid LAD beyond the stented segment.  Successful PTCA/DES x 1 mid LAD Mild restenosis Diagonal stent Severe stenosis in the small caliber AV groove Circumflex-too small for stent placement Dominant RCA with patent proximal to mid stented segment without significant restenosis   Recommendations: Monitor overnight given advanced age and groin access for PCI. DAPT with ASA and Plavix  for one year.   Diagnostic Dominance: Right  Intervention     _____________   History of Present Illness   [name] is a 83 y.o. female with a history of CAD s/p multiple PCIs (DES to LAD and Diag in 10/2006 and DES to RCA in 05/2007), carotid artery disease (right > left), hypertension, hyperlipidemia, and sleep apnea. She was recently admitted in early 02/2024 for work-up of shoulder pain. There was no evidence of ACS at that time so he was felt to be stable for discharge with close outpatient follow-up. He was seen by Dr. Cash on 02/26/2024 at which time, she continued to complain of exertional arm pain but denied  any other cardiac symptoms. This was similar to her prior angina. Therefore, outpatient cardiac catheterization was arranged for further evaluation.  Hospital Course   Consultants: None   Patient presented to [facility] on 03/10/2024 for planned cardiac catheterization. LHC showed two tandem 80% mid LAD stenoses (one within prior stent and one distal to it), 99% stenosis of mid LCX, and mild restenosis of only 20% of prior stents to proximal RCA and 1st Diag. She underwent successful PTCA/DES to the mid LAD covering both lesion. The severe stenosis in the small caliber groove LCx was felt to be too small for stent placement so was treated medically. She was monitored overnight given advanced age and groin access. She is doing well this morning. Femoral cath site stable. Continue DAPT with Aspirin  81mg  daily and Plavix  75mg  daily for 1 year (only on Plavix  at home). Continue home Amlodipine  2.5mg  daily. Continue home Crestor  10mg  daily and Vascepa  2g twice daily. Also on Repatha  at home.   She has a soft systolic murmur noted on exam. Last Echo in 02/2022 showed moderate MAC and mild aortic valve calcification but no aortic stenosis or mitral regurgitation. Can consider outpatient Echo.  Of note, WBC 17,2 on day of discharge. Looks like WBC is chronically mildly elevated. She has no infectious symptoms. Can recheck at follow-up visit.  Patient seen and examined by Dr. Elmira today and determined to be stable for discharge. Outpatient follow-up arranged. Medications as below.   Did the patient have an  acute coronary syndrome (MI, NSTEMI, STEMI, etc) this admission?:  No                               Did the patient have a percutaneous coronary intervention (stent / angioplasty)?:  Yes.     Cath/PCI Registry Performance & Quality Measures: Aspirin  prescribed? - Yes ADP Receptor Inhibitor (Plavix /Clopidogrel , Brilinta/Ticagrelor or Effient/Prasugrel) prescribed (includes medically managed  patients)? - Yes High Intensity Statin (Lipitor 40-80mg  or Crestor  20-40mg ) prescribed? - Yes For EF <40%, was ACEI/ARB prescribed? - Not Applicable (EF >/= 40%) For EF <40%, Aldosterone Antagonist (Spironolactone or Eplerenone) prescribed? - Not Applicable (EF >/= 40%) Cardiac Rehab Phase II ordered? - Yes   _____________  Discharge Vitals Blood pressure 115/88, pulse 70, temperature 98.3 F (36.8 C), temperature source Oral, resp. rate 16, height 5' 2 (1.575 m), weight 70.3 kg, SpO2 95%.  Filed Weights   03/10/24 0701  Weight: 70.3 kg   Physical Exam:  General: 83 y.o. Caucasian female resting comfortably in no acute distress. Neck: Supple. Heart: RRR. Distinct S1 and S2. II/VI systolic murmur heard best at right upper sternal border. Right radial and right femoral cath site stable.  Lungs: No increased work of breathing. Clear to ausculation bilaterally. No wheezes, rhonchi, or rales.  Extremities: No lower extremity edema.    Skin: Warm and dry. Neuro: Alert and oriented x3. No focal deficits. Psych: Normal affect. Responds appropriately.   Labs & Radiologic Studies  CBC Recent Labs    03/11/24 0352  WBC 17.2*  HGB 13.6  HCT 41.8  MCV 91.1  PLT 354   Basic Metabolic Panel Recent Labs    90/75/74 0726 03/11/24 0352  NA 136 138  K 4.3 3.9  CL 106 107  CO2 23 19*  GLUCOSE 93 86  BUN 14 14  CREATININE 0.87 0.81  CALCIUM  8.7* 8.7*   Liver Function Tests No results for input(s): AST, ALT, ALKPHOS, BILITOT, PROT, ALBUMIN in the last 72 hours. No results for input(s): LIPASE, AMYLASE in the last 72 hours. High Sensitivity Troponin:   Recent Labs  Lab 02/21/24 0558  TROPONINIHS 11    Recent Labs  Lab 02/20/24 1001 02/20/24 1156  TRNPT 22* 21*    BNP Invalid input(s): POCBNP No results for input(s): PROBNP in the last 72 hours.  No results for input(s): BNP in the last 72 hours.  D-Dimer No results for input(s): DDIMER in the  last 72 hours. Hemoglobin A1C No results for input(s): HGBA1C in the last 72 hours. Fasting Lipid Panel No results for input(s): CHOL, HDL, LDLCALC, TRIG, CHOLHDL, LDLDIRECT in the last 72 hours. No results found for: LIPOA  Thyroid Function Tests No results for input(s): TSH, T4TOTAL, T3FREE, THYROIDAB in the last 72 hours.  Invalid input(s): FREET3 _____________  CARDIAC CATHETERIZATION Result Date: 03/10/2024   Prox RCA lesion is 20% stenosed.   Mid Cx lesion is 99% stenosed.   1st Diag lesion is 20% stenosed.   Mid LAD-1 lesion is 80% stenosed.   Mid LAD-2 lesion is 80% stenosed.   A drug-eluting stent was successfully placed using a STENT SYNERGY XD 2.25X28.   Post intervention, there is a 0% residual stenosis.   Post intervention, there is a 0% residual stenosis. Severe restenosis mid LAD stented segment. Severe stenosis mid LAD beyond the stented segment. Successful PTCA/DES x 1 mid LAD Mild restenosis Diagonal stent Severe stenosis in the small caliber AV  groove Circumflex-too small for stent placement Dominant RCA with patent proximal to mid stented segment without significant restenosis Recommendations: Monitor overnight given advanced age and groin access for PCI. DAPT with ASA and Plavix  for one year.   DG Shoulder Left Port Result Date: 02/20/2024 EXAM: 1 VIEW XRAY OF THE LEFT SHOULDER 02/20/2024 08:18:00 PM COMPARISON: None available. CLINICAL HISTORY: Left shoulder pain for a few days. FINDINGS: BONES AND JOINTS: Glenohumeral joint is normally aligned. No acute fracture or dislocation. The [facility] joint is unremarkable in appearance. SOFT TISSUES: No abnormal calcifications. Visualized lung is unremarkable. IMPRESSION: 1. No significant abnormality. Electronically signed by: Norman Gatlin MD 02/20/2024 08:22 PM EDT RP Workstation: [id number]   DG Chest Port 1 View Result Date: 02/20/2024 CLINICAL DATA:  Left chest, shoulder and upper arm pain. EXAM: PORTABLE CHEST  1 VIEW COMPARISON:  12/27/2012. FINDINGS: Normal-sized heart. Tortuous and partially calcified thoracic aorta. Clear lungs with normal vascularity. Cervical spine degenerative changes. IMPRESSION: No acute abnormality. Electronically Signed   By: Elspeth Bathe M.D.   On: 02/20/2024 10:29    Disposition Patient is being discharged home today in good condition.  Follow-up Plans & Appointments  Discharge Instructions     Diet - low sodium heart healthy   Complete by: As directed    Increase activity slowly   Complete by: As directed        Discharge Medications Allergies as of 03/11/2024       Reactions   Other Hives, Itching, Swelling   VICRYL STITCHES Tongue swelling Other reaction(s): Unknown   Fluoride Preparations Other (See Comments)   A test for eye to make pictures, (  causing pressure of Left arm )   Rosuvastatin  Other (See Comments)   Leg pains, cramping. Pt tolerates 10mg  at home   Imdur  [isosorbide  Nitrate] Other (See Comments)   Headaches        Medication List     STOP taking these medications    isosorbide  mononitrate 30 MG 24 hr tablet Commonly known as: IMDUR        TAKE these medications    amLODipine  2.5 MG tablet Commonly known as: NORVASC  TAKE 1 TABLET BY MOUTH EVERY DAY What changed:  how much to take how to take this when to take this   aspirin  EC 81 MG tablet Take 1 tablet (81 mg total) by mouth daily. Swallow whole.   cholecalciferol  1000 units tablet Commonly known as: VITAMIN D Take 1,000 Units by mouth in the morning.   clopidogrel  75 MG tablet Commonly known as: PLAVIX  TAKE 1 TABLET BY MOUTH EVERY DAY   COQ10 PO Take 1 capsule by mouth every evening.   cyanocobalamin  1000 MCG tablet Commonly known as: VITAMIN B12 Take 1,000 mcg by mouth in the morning.   EPINEPHrine 0.3 mg/0.3 mL Soaj injection Commonly known as: EPI-PEN Inject 0.3 mg into the muscle as needed for anaphylaxis.   fluticasone 50 MCG/ACT nasal  spray Commonly known as: FLONASE Place 1 spray into both nostrils as needed for allergies.   Gemtesa 75 MG Tabs Generic drug: Vibegron Take 75 mg by mouth every evening.   levocetirizine 5 MG tablet Commonly known as: XYZAL  Take 5 mg by mouth every evening.   magnesium  oxide 400 (240 Mg) MG tablet Commonly known as: MAG-OX Take 400 mg by mouth every evening.   METAMUCIL PO Take 1 Dose by mouth every evening.   multivitamin with minerals Tabs tablet Take 1 tablet by mouth daily.   nitroGLYCERIN  0.4 MG  SL tablet Commonly known as: NITROSTAT  PLACE 1 TABLET UNDER THE TONGUE EVERY 5 MINUTES AS NEEDED FOR CHEST PAIN.   polyethylene glycol 17 g packet Commonly known as: MIRALAX  / GLYCOLAX  Take 17 g by mouth every evening.   Repatha  SureClick 140 MG/ML Soaj Generic drug: Evolocumab  Inject 140 mg into the skin every 14 (fourteen) days.   rosuvastatin  10 MG tablet Commonly known as: CRESTOR  Take 1 tablet (10 mg total) by mouth daily. What changed: when to take this   Vascepa  1 g capsule Generic drug: icosapent  Ethyl TAKE 2 CAPSULES BY MOUTH 2 TIMES DAILY.         Outstanding Labs/Studies Repeat CBC at follow-up visit.  Duration of Discharge Encounter: APP Time: 25 minutes   Signed, Ryley Teater E Kieu Quiggle, PA-C 03/11/2024, 8:50 AM

## 2024-03-29 NOTE — Progress Notes (Unsigned)
 Cardiology Office Note:    Date:  03/30/2024   ID:  [name], DOB October 10, 1940, MRN [id number]  PCP:  [name], MD   [facility] Providers Cardiologist:  [name], MD     Referring MD: [name], MD   Chief complaint: Follow up PCI  History of Present Illness:    [name] is a 83 y.o. female with a hx of CAD with PCI, carotid artery disease, PAD, HTN, HLD, OSA, who presents to the office today following recent hospital admission for CAD s/p PCI.  Drug eluting stents LAD and diagonal May 2008, followed by DES RCA in December 2008. Stress test February 2011 in [facility] with no ischemia, normal LVEF at 88%.   Cardiac cath June 2013 with stable disease. Stress myoview  July 2014 with no ischemia. She had left sided weakness in March 2015 and was seen in [facility] and was found to have moderate carotid artery disease. She has been followed in the [facility] office for her carotid disease. Murmur auscultated 04/2019, subsequent echo on 04/2019 showed LVEF 60-65%, normal LV function, indeterminate diastolic parameters, trivial mitral/tricuspid/AV regurg.  Subsequent echo in 02/2022 showed LVEF 55-60%, no RWMA, normal diastolic parameters.  Moderate mitral annular calcification, mild calcification and thickening of AV valve.  Carotid artery dopplers May 2025 with moderate RICA stenosis (40-59%) and mild disease in the LICA.   Presented to the ED on 02/20/2024 c/o left shoulder pain on exertion, no evidence of ACS.  Cardiology recommended Imdur  and outpatient follow-up. At [facility] on 02/25/2024 she was found to have stable angina, cardiac cath was scheduled for 03/10/2024.  LCH: Severe restenosis mid LAD stented segment. Severe stenosis mid LAD beyond the stented segment. Successful PTCA/DES x 1 mid LAD. Mild restenosis diagonal stent. Severe stenosis in the small caliber AV groove Circumflex-too small for stent placement. Dominant RCA with patent proximal to mid  stented segment without significant restenosis. Patient was monitored overnight d/t advanced age w/ groin access. DAPT w/ ASA and Plavix  x 1 year was recommended.   Patient presents alone to the clinic today, appears stable from a cardiac standpoint.  Patient reports her previous anginal equivalent was left shoulder pain. Left shoulder pain no longer radiates down left arm, no longer as intense in severity, no longer feels like a pressure.  However, continues to occur on exertion, particularly when she takes her morning walks.  Pain starts shortly after walking, states it does improve by the end of her walk.  Reports a significant history of arthritis throughout multiple joints, reports she will follow-up with orthopedics to evaluate for arthritis in affected shoulder.  Denies associated symptoms such as shortness of breath, nausea, palpitations with this left shoulder pain. She denies chest pain, palpitations, dyspnea, orthopnea, n, v, dark/tarry/bloody stools, hematuria, dizziness, syncope, edema, weight gain, excessive bleeding/bruising.     Past Medical History:  Diagnosis Date   Allergy    Arthritis    CAD (coronary artery disease)    a. DES to LAD, diagonal 10/2006. b. DES to RCA 05/2007. c. Last cath 11/2012: patent stents, stable dz. d. Lexiscan  cardiolite  negative 12/2012.   Cataract    left eye   Diverticulitis    Diverticulosis of colon    GERD (gastroesophageal reflux disease)    Hemorrhoids    History of recurrent UTIs    Hx of adenomatous colonic polyps    tubular   Hyperlipidemia    Hypertension    Peripheral vascular disease  Sleep apnea    wears CPAP- 03/07/2020 no longer wears CPAP    Past Surgical History:  Procedure Laterality Date   ANGIOPLASTY  5/08  and  12/08    stent x3   Cataract left     COLONOSCOPY     CORONARY STENT INTERVENTION N/A 03/10/2024   Procedure: CORONARY STENT INTERVENTION;  Surgeon: Verlin Lonni BIRCH, MD;  Location: [facility];   Service: Cardiovascular;  Laterality: N/A;   EYE SURGERY Left    laser eye surgery for blood leakage   FOOT TENDON SURGERY Left Sept. 20, 2016    Left Calf / foot -   Dr. Harden   LEFT HEART CATH AND CORONARY ANGIOGRAPHY N/A 03/10/2024   Procedure: LEFT HEART CATH AND CORONARY ANGIOGRAPHY;  Surgeon: Verlin Lonni BIRCH, MD;  Location: [facility];  Service: Cardiovascular;  Laterality: N/A;   MASS EXCISION Right 07/13/2019   Procedure: EXCISION VOLAR CYST RIGHT WRIST;  Surgeon: Murrell Kuba, MD;  Location: [facility];  Service: Orthopedics;  Laterality: Right;  IV REGIONAL FOREARM BLOCK   Right shoulder surgery     Vitrectomy left eye      Current Medications: Current Meds  Medication Sig   amLODipine  (NORVASC ) 2.5 MG tablet TAKE 1 TABLET BY MOUTH EVERY DAY (Patient taking differently: Take 2.5 mg by mouth every evening. TAKE 1 TABLET BY MOUTH EVERY DAY)   aspirin  EC 81 MG tablet Take 1 tablet (81 mg total) by mouth daily. Swallow whole.   cholecalciferol  (VITAMIN D) 1000 UNITS tablet Take 1,000 Units by mouth in the morning.   clopidogrel  (PLAVIX ) 75 MG tablet TAKE 1 TABLET BY MOUTH EVERY DAY   Coenzyme Q10 (COQ10 PO) Take 1 capsule by mouth every evening.   EPINEPHrine 0.3 mg/0.3 mL IJ SOAJ injection Inject 0.3 mg into the muscle as needed for anaphylaxis.   Evolocumab  (REPATHA  SURECLICK) 140 MG/ML SOAJ Inject 140 mg into the skin every 14 (fourteen) days.   fluticasone (FLONASE) 50 MCG/ACT nasal spray Place 1 spray into both nostrils as needed for allergies.    GEMTESA 75 MG TABS Take 75 mg by mouth every evening.   levocetirizine (XYZAL ) 5 MG tablet Take 5 mg by mouth every evening.   magnesium  oxide (MAG-OX) 400 (240 Mg) MG tablet Take 400 mg by mouth every evening.   Multiple Vitamin (MULTIVITAMIN WITH MINERALS) TABS Take 1 tablet by mouth daily.   nitroGLYCERIN  (NITROSTAT ) 0.4 MG SL tablet PLACE 1 TABLET UNDER THE TONGUE EVERY 5 MINUTES AS NEEDED FOR CHEST  PAIN.   polyethylene glycol (MIRALAX  / GLYCOLAX ) 17 g packet Take 17 g by mouth every evening.   Psyllium (METAMUCIL PO) Take 1 Dose by mouth every evening.   rosuvastatin  (CRESTOR ) 10 MG tablet Take 1 tablet (10 mg total) by mouth daily. (Patient taking differently: Take 10 mg by mouth every evening.)   VASCEPA  1 g capsule TAKE 2 CAPSULES BY MOUTH 2 TIMES DAILY.   vitamin B-12 (CYANOCOBALAMIN ) 1000 MCG tablet Take 1,000 mcg by mouth in the morning.     Allergies:   Other, Fluoride preparations, Rosuvastatin , and Imdur  [isosorbide  nitrate]   Social History   Socioeconomic History   Marital status: Married    Spouse name: Not on file   Number of children: 0   Years of education: Not on file   Highest education level: Not on file  Occupational History   Occupation: retired     Associate Professor: RETIRED  Tobacco Use  Smoking status: Never   Smokeless tobacco: Never  Vaping Use   Vaping status: Never Used  Substance and Sexual Activity   Alcohol  use: Yes    Alcohol /week: 2.0 - 3.0 standard drinks of alcohol     Types: 2 - 3 Standard drinks or equivalent per week    Comment: 1 per day   Drug use: No   Sexual activity: Not on file  Other Topics Concern   Not on file  Social History Narrative   Not on file   Social Drivers of Health   Financial Resource Strain: Not on file  Food Insecurity: No Food Insecurity (03/10/2024)   Hunger Vital Sign    Worried About Running Out of Food in the Last Year: Never true    Ran Out of Food in the Last Year: Never true  Transportation Needs: No Transportation Needs (03/10/2024)   PRAPARE - Administrator, Civil Service (Medical): No    Lack of Transportation (Non-Medical): No  Physical Activity: Not on file  Stress: Not on file  Social Connections: Patient Declined (03/10/2024)   Social Connection and Isolation Panel    Frequency of Communication with Friends and Family: Patient declined    Frequency of Social Gatherings with Friends  and Family: Patient declined    Attends Religious Services: Patient declined    Database administrator or Organizations: Patient declined    Attends Engineer, structural: Patient declined    Marital Status: Patient declined     Family History: The patient's family history includes Cancer in her brother; Colon polyps in her brother and paternal grandfather; Deep vein thrombosis in her father; Dementia in her mother; Heart attack in her brother; Heart attack (age of onset: 87) in her father; Heart disease in her brother, brother, father, and another family member; Hyperlipidemia in her brother and father; Liver cancer in her paternal grandfather; Ovarian cancer in her maternal grandmother; Prostate cancer in her brother; Stroke in her father and mother. There is no history of Colon cancer, Esophageal cancer, Stomach cancer, or Rectal cancer.  ROS:   Please see the history of present illness.     All other systems reviewed and are negative.  EKGs/Labs/Other Studies Reviewed:    The following studies were reviewed today:  EKG Interpretation Date/Time:  Tuesday March 30 2024 10:10:08 EDT Ventricular Rate:  77 PR Interval:  190 QRS Duration:  78 QT Interval:  384 QTC Calculation: 434 R Axis:   -11  Text Interpretation: Normal sinus rhythm Confirmed by Daryan Cagley [phone]) on 03/30/2024 10:27:42 AM    Recent Labs: 02/20/2024: ALT 18 02/21/2024: Magnesium  2.3 03/11/2024: BUN 14; Creatinine, Ser 0.81; Hemoglobin 13.6; Platelets 354; Potassium 3.9; Sodium 138  Recent Lipid Panel    Component Value Date/Time   CHOL 100 02/21/2024 0558   CHOL 103 11/05/2023 0802   TRIG 79 02/21/2024 0558   HDL 61 02/21/2024 0558   HDL 54 11/05/2023 0802   CHOLHDL 1.6 02/21/2024 0558   VLDL 16 02/21/2024 0558   LDLCALC 23 02/21/2024 0558   LDLCALC 28 11/05/2023 0802   LDLDIRECT 138 (H) 05/13/2022 1005     Physical Exam:    VS:  BP 126/76   Pulse 77   Ht 5' 2 (1.575 m)   Wt 154 lb  12.8 oz (70.2 kg)   SpO2 95%   BMI 28.31 kg/m     Wt Readings from Last 3 Encounters:  03/30/24 154 lb 12.8 oz (70.2 kg)  03/10/24 155 lb (70.3 kg)  02/25/24 154 lb 12.8 oz (70.2 kg)     GEN:  Well nourished, well developed in no acute distress HEENT: Normal NECK: No carotid bruits CARDIAC: RRR, systolic murmur heard over right sternal border second IC space (3/6), rubs, gallops RESPIRATORY:  Clear to auscultation without rales, wheezing or rhonchi  MUSCULOSKELETAL:  No edema; No deformity; healing faint bruising visible in right leg following vascular access. No hematoma, ecchymosis, tenderness over right femoral access site. Strong right femoral pulse, non-tender. 1+ DP bilaterally. SKIN: Warm and dry NEUROLOGIC:  Alert and oriented x 3 PSYCHIATRIC:  Normal affect   ASSESSMENT:    1. Coronary artery disease of native artery of native heart with stable angina pectoris   2. Primary hypertension   3. Hyperlipidemia, unspecified hyperlipidemia type   4. Bilateral carotid artery stenosis    PLAN:    In order of problems listed above:  CAD s/p PCI w/ DES X1 mLAD Stable angina Groin access site appears well-healed, no hematoma/ecchymosis palpated/visualized, DP pulses 1+ bilaterally EKG: NSR 77 bpm Denies CP, SOB, palpitations, near-syncope Reports continued left shoulder pain, less intense, does not radiated down arm as it did prior to procedure. Still occurs with exertion, primarily during her morning walks, pain improves by end of walk. Patient unsure of whether this is 2/2 heart issues vs arthritis and plans to f/u w/ orthopedics Symptoms likely consistent w/ stable angina considering improvement following cardiac cath, possibly r/t known cfx disease. Discussed possibility of Imdur , however patient not interested in medication that may drop her BP Denies missing any doses of DAPT  Continue amlodipine  2.5 mg daily Continue aspirin  EC 81 mg daily Continue Plavix  75 mg  daily Continue nitroglycerin  0.4 mg SL as needed for chest pain every 5 minutes, has not used any Continue rosuvastatin  10 mg daily Check CBC to reassess elevated WBCs at time of cardiac cath  Systolic murmur Echo 02/19/2022: LVEF 55-60%, no RWMA, normal diastolic parameters, moderate mitral annular calcification.  Mild calcification of aortic valve with mild thickening.  No evidence of AV stenosis. Systolic murmur heard over right sternal border second IC space (3/6) on exam today Patient reports noticeable fatigue over the last year Will order repeat echo   HTN BPs not checked at home Denies issues with BP in past Requested bi-monthly BPs and to report readings >140/90 Continue amlodipine  2.5 mg daily  HLD, LDL goal <55 Labs 02/21/2024: Cholesterol 100, HDL 61, LDL 23, triglycerides 79 LFTs: AST 30, ALT 18 Continue rosuvastatin  10 mg daily Continue Repatha  140 mg every 2 weeks Continue Vascepa  2 g twice daily  Carotid artery disease Moderate RICA stenosis by dopplers in May 2025. Followed in [facility]   Keep follow up appointment with Dr. Verlin to discuss continued stable angina    Cardiac Rehabilitation Eligibility Assessment            Medication Adjustments/Labs and Tests Ordered: Current medicines are reviewed at length with the patient today.  Concerns regarding medicines are outlined above.  Orders Placed This Encounter  Procedures   EKG 12-Lead   ECHOCARDIOGRAM COMPLETE   No orders of the defined types were placed in this encounter.   Patient Instructions  Medication Instructions:  Your physician recommends that you continue on your current medications as directed. Please refer to the Current Medication list given to you today. *If you need a refill on your cardiac medications before your next appointment, please call your pharmacy*  Lab Work: [facility] If  you have labs (blood work) drawn today and your tests are completely normal, you will receive your results  only by: MyChart Message (if you have MyChart) OR A paper copy in the mail If you have any lab test that is abnormal or we need to change your treatment, we will call you to review the results.  Testing/Procedures: Your physician has requested that you have an echocardiogram. Echocardiography is a painless test that uses sound waves to create images of your heart. It provides your doctor with information about the size and shape of your heart and how well your heart's chambers and valves are working. This procedure takes approximately one hour. There are no restrictions for this procedure. Please do NOT wear cologne, perfume, aftershave, or lotions (deodorant is allowed). Please arrive 15 minutes prior to your appointment time.  Please note: We ask at that you not bring children with you during ultrasound (echo/ vascular) testing. Due to room size and safety concerns, children are not allowed in the ultrasound rooms during exams. Our front office staff cannot provide observation of children in our lobby area while testing is being conducted. An adult accompanying a patient to their appointment will only be allowed in the ultrasound room at the discretion of the ultrasound technician under special circumstances. We apologize for any inconvenience.  Follow-Up: At [facility], you and your health needs are our priority.  As part of our continuing mission to provide you with exceptional heart care, our providers are all part of one team.  This team includes your primary Cardiologist (physician) and Advanced Practice Providers or APPs (Physician Assistants and Nurse Practitioners) who all work together to provide you with the care you need, when you need it.  Your next appointment:   1 week(s)  Provider:   [name], MD    We recommend signing up for the patient portal called MyChart.  Sign up information is provided on this After Visit Summary.  MyChart is used to connect with  patients for Virtual Visits (Telemedicine).  Patients are able to view lab/test results, encounter notes, upcoming appointments, etc.  Non-urgent messages can be sent to your provider as well.   To learn more about what you can do with MyChart, go to [email or web address].   Other Instructions            Signed, Miriam FORBES Shams, NP  03/30/2024 12:12 PM    [facility]

## 2024-03-30 ENCOUNTER — Encounter: Payer: Self-pay | Admitting: General Practice

## 2024-03-30 ENCOUNTER — Ambulatory Visit: Attending: General Practice | Admitting: Emergency Medicine

## 2024-03-30 VITALS — BP 126/76 | HR 77 | Ht 62.0 in | Wt 154.8 lb

## 2024-03-30 DIAGNOSIS — I10 Essential (primary) hypertension: Secondary | ICD-10-CM

## 2024-03-30 DIAGNOSIS — E785 Hyperlipidemia, unspecified: Secondary | ICD-10-CM

## 2024-03-30 DIAGNOSIS — D72829 Elevated white blood cell count, unspecified: Secondary | ICD-10-CM | POA: Diagnosis not present

## 2024-03-30 DIAGNOSIS — I25118 Atherosclerotic heart disease of native coronary artery with other forms of angina pectoris: Secondary | ICD-10-CM | POA: Diagnosis not present

## 2024-03-30 DIAGNOSIS — R011 Cardiac murmur, unspecified: Secondary | ICD-10-CM

## 2024-03-30 DIAGNOSIS — I6523 Occlusion and stenosis of bilateral carotid arteries: Secondary | ICD-10-CM

## 2024-03-30 NOTE — Patient Instructions (Signed)
 Medication Instructions:  Your physician recommends that you continue on your current medications as directed. Please refer to the Current Medication list given to you today. *If you need a refill on your cardiac medications before your next appointment, please call your pharmacy*  Lab Work: [facility] If you have labs (blood work) drawn today and your tests are completely normal, you will receive your results only by: MyChart Message (if you have MyChart) OR A paper copy in the mail If you have any lab test that is abnormal or we need to change your treatment, we will call you to review the results.  Testing/Procedures: Your physician has requested that you have an echocardiogram. Echocardiography is a painless test that uses sound waves to create images of your heart. It provides your doctor with information about the size and shape of your heart and how well your heart's chambers and valves are working. This procedure takes approximately one hour. There are no restrictions for this procedure. Please do NOT wear cologne, perfume, aftershave, or lotions (deodorant is allowed). Please arrive 15 minutes prior to your appointment time.  Please note: We ask at that you not bring children with you during ultrasound (echo/ vascular) testing. Due to room size and safety concerns, children are not allowed in the ultrasound rooms during exams. Our front office staff cannot provide observation of children in our lobby area while testing is being conducted. An adult accompanying a patient to their appointment will only be allowed in the ultrasound room at the discretion of the ultrasound technician under special circumstances. We apologize for any inconvenience.  Follow-Up: At [facility], you and your health needs are our priority.  As part of our continuing mission to provide you with exceptional heart care, our providers are all part of one team.  This team includes your primary Cardiologist  (physician) and Advanced Practice Providers or APPs (Physician Assistants and Nurse Practitioners) who all work together to provide you with the care you need, when you need it.  Your next appointment:   1 week(s)  Provider:   [name], MD    We recommend signing up for the patient portal called MyChart.  Sign up information is provided on this After Visit Summary.  MyChart is used to connect with patients for Virtual Visits (Telemedicine).  Patients are able to view lab/test results, encounter notes, upcoming appointments, etc.  Non-urgent messages can be sent to your provider as well.   To learn more about what you can do with MyChart, go to [email or web address].   Other Instructions

## 2024-03-30 NOTE — Addendum Note (Signed)
 Addended by: Brynlynn Walko E on: 03/30/2024 01:03 PM   Modules accepted: Orders

## 2024-03-31 ENCOUNTER — Ambulatory Visit: Payer: Self-pay | Admitting: Emergency Medicine

## 2024-03-31 LAB — CBC
Hematocrit: 43.9 % (ref 34.0–46.6)
Hemoglobin: 13.8 g/dL (ref 11.1–15.9)
MCH: 29.1 pg (ref 26.6–33.0)
MCHC: 31.4 g/dL — ABNORMAL LOW (ref 31.5–35.7)
MCV: 92 fL (ref 79–97)
Platelets: 392 x10E3/uL (ref 150–450)
RBC: 4.75 x10E6/uL (ref 3.77–5.28)
RDW: 11.9 % (ref 11.7–15.4)
WBC: 13.7 x10E3/uL — ABNORMAL HIGH (ref 3.4–10.8)

## 2024-03-31 NOTE — Telephone Encounter (Signed)
Spoke with pt regarding lab results. Pt verbalized understanding and had no further questions.

## 2024-04-07 DIAGNOSIS — M25512 Pain in left shoulder: Secondary | ICD-10-CM | POA: Diagnosis not present

## 2024-04-07 NOTE — Progress Notes (Unsigned)
 No chief complaint on file.  History of Present Illness: 83 yo female with history of CAD with previous stenting of the mid LAD, Diagonal and Proximal and mid RCA, HTN, HLD, sleep apnea and carotid artery disease who is here today for follow up. Her cardiac history includes drug eluting stents placed in the LAD and Diagonal in May 2008, followed by drug eluting stent placement in the RCA in December 2008. At her initial visit in our office November 2012, she had c/o left arm pain and her prior anginal equivalent was left arm pain. She had recurrence of pain in her left arm with exertion associated with fatigue and SOB in 2013. Cardiac cath June 2013 with stable disease, patent stents LAD, Diagonal, RCA. She was admitted July 2014 with left shoulder pain. Stress myoview  12/28/12 with no ischemia. Most recent stress test in February 2024 did not show ischemia. She had left sided weakness in March 2015 and was seen in [facility] and was found to have moderate carotid artery disease. She has been followed in the [facility] office for her carotid disease.Carotid artery dopplers May 2025 with moderate RICA stenosis (40-59%) and mild disease in the LICA.  Echo September 2023 with LVEF=55-60%. No significant valve disease.  She was admitted to [facility] 02/20/24 with left shoulder pain with negative troponin and discharged home. When I saw her in the office 02/25/24 she described ongoing left shoulder pain with exertion. Cardiac cath 03/10/24 with severe mid LAD stenosis within the old stent and just beyond the stent. This was treated with a drug eluting stent. Severe stenosis in the small caliber AV groove Circumflex that was felt to be too small for stenting. Patent proximal to mid RCA stent. Mild restenosis Diagonal stent. She was seen in our office on 03/30/24 by Miriam Shams, NP and continued to complain of left shoulder pain with exertion.   She is here today for follow up. The patient denies any chest pain, dyspnea, palpitations,  lower extremity edema, orthopnea, PND, dizziness, near syncope or syncope.    Primary Care Physician: Julie Rush, MD  Past Medical History:  Diagnosis Date   Allergy    Arthritis    CAD (coronary artery disease)    a. DES to LAD, diagonal 10/2006. b. DES to RCA 05/2007. c. Last cath 11/2012: patent stents, stable dz. d. Lexiscan  cardiolite  negative 12/2012.   Cataract    left eye   Diverticulitis    Diverticulosis of colon    GERD (gastroesophageal reflux disease)    Hemorrhoids    History of recurrent UTIs    Hx of adenomatous colonic polyps    tubular   Hyperlipidemia    Hypertension    Peripheral vascular disease    Sleep apnea    wears CPAP- 03/07/2020 no longer wears CPAP    Past Surgical History:  Procedure Laterality Date   ANGIOPLASTY  5/08  and  12/08    stent x3   Cataract left     COLONOSCOPY     CORONARY STENT INTERVENTION N/A 03/10/2024   Procedure: CORONARY STENT INTERVENTION;  Surgeon: Verlin Lonni BIRCH, MD;  Location: [facility] INVASIVE CV LAB;  Service: Cardiovascular;  Laterality: N/A;   EYE SURGERY Left    laser eye surgery for blood leakage   FOOT TENDON SURGERY Left Sept. 20, 2016    Left Calf / foot -   Dr. Harden   LEFT HEART CATH AND CORONARY ANGIOGRAPHY N/A 03/10/2024   Procedure: LEFT HEART CATH AND CORONARY  ANGIOGRAPHY;  Surgeon: Verlin Lonni BIRCH, MD;  Location: [facility] INVASIVE CV LAB;  Service: Cardiovascular;  Laterality: N/A;   MASS EXCISION Right 07/13/2019   Procedure: EXCISION VOLAR CYST RIGHT WRIST;  Surgeon: Murrell Kuba, MD;  Location:  SURGERY CENTER;  Service: Orthopedics;  Laterality: Right;  IV REGIONAL FOREARM BLOCK   Right shoulder surgery     Vitrectomy left eye      Current Outpatient Medications  Medication Sig Dispense Refill   amLODipine  (NORVASC ) 2.5 MG tablet TAKE 1 TABLET BY MOUTH EVERY DAY (Patient taking differently: Take 2.5 mg by mouth every evening. TAKE 1 TABLET BY MOUTH EVERY DAY) 90 tablet 3   aspirin  EC 81  MG tablet Take 1 tablet (81 mg total) by mouth daily. Swallow whole.     cholecalciferol  (VITAMIN D) 1000 UNITS tablet Take 1,000 Units by mouth in the morning.     clopidogrel  (PLAVIX ) 75 MG tablet TAKE 1 TABLET BY MOUTH EVERY DAY 90 tablet 2   Coenzyme Q10 (COQ10 PO) Take 1 capsule by mouth every evening.     EPINEPHrine 0.3 mg/0.3 mL IJ SOAJ injection Inject 0.3 mg into the muscle as needed for anaphylaxis.     Evolocumab  (REPATHA  SURECLICK) 140 MG/ML SOAJ Inject 140 mg into the skin every 14 (fourteen) days. 6 mL 3   fluticasone (FLONASE) 50 MCG/ACT nasal spray Place 1 spray into both nostrils as needed for allergies.   2   GEMTESA 75 MG TABS Take 75 mg by mouth every evening.     levocetirizine (XYZAL ) 5 MG tablet Take 5 mg by mouth every evening.     magnesium  oxide (MAG-OX) 400 (240 Mg) MG tablet Take 400 mg by mouth every evening.     Multiple Vitamin (MULTIVITAMIN WITH MINERALS) TABS Take 1 tablet by mouth daily.     nitroGLYCERIN  (NITROSTAT ) 0.4 MG SL tablet PLACE 1 TABLET UNDER THE TONGUE EVERY 5 MINUTES AS NEEDED FOR CHEST PAIN. 25 tablet 8   polyethylene glycol (MIRALAX  / GLYCOLAX ) 17 g packet Take 17 g by mouth every evening.     Psyllium (METAMUCIL PO) Take 1 Dose by mouth every evening.     rosuvastatin  (CRESTOR ) 10 MG tablet Take 1 tablet (10 mg total) by mouth daily. (Patient taking differently: Take 10 mg by mouth every evening.) 90 tablet 2   VASCEPA  1 g capsule TAKE 2 CAPSULES BY MOUTH 2 TIMES DAILY. 360 capsule 3   vitamin B-12 (CYANOCOBALAMIN ) 1000 MCG tablet Take 1,000 mcg by mouth in the morning.     No current facility-administered medications for this visit.    Allergies  Allergen Reactions   Other Hives, Itching and Swelling    VICRYL STITCHES Tongue swelling Other reaction(s): Unknown   Fluoride Preparations Other (See Comments)    A test for eye to make pictures, (  causing pressure of Left arm )   Rosuvastatin  Other (See Comments)    Leg pains, cramping.  Pt tolerates 10mg  at home   Imdur  [Isosorbide  Nitrate] Other (See Comments)    Headaches    Social History   Socioeconomic History   Marital status: Married    Spouse name: Not on file   Number of children: 0   Years of education: Not on file   Highest education level: Not on file  Occupational History   Occupation: retired     Associate Professor: RETIRED  Tobacco Use   Smoking status: Never   Smokeless tobacco: Never  Vaping Use   Vaping  status: Never Used  Substance and Sexual Activity   Alcohol  use: Yes    Alcohol /week: 2.0 - 3.0 standard drinks of alcohol     Types: 2 - 3 Standard drinks or equivalent per week    Comment: 1 per day   Drug use: No   Sexual activity: Not on file  Other Topics Concern   Not on file  Social History Narrative   Not on file   Social Drivers of Health   Financial Resource Strain: Not on file  Food Insecurity: No Food Insecurity (03/10/2024)   Hunger Vital Sign    Worried About Running Out of Food in the Last Year: Never true    Ran Out of Food in the Last Year: Never true  Transportation Needs: No Transportation Needs (03/10/2024)   PRAPARE - Administrator, Civil Service (Medical): No    Lack of Transportation (Non-Medical): No  Physical Activity: Not on file  Stress: Not on file  Social Connections: Patient Declined (03/10/2024)   Social Connection and Isolation Panel    Frequency of Communication with Friends and Family: Patient declined    Frequency of Social Gatherings with Friends and Family: Patient declined    Attends Religious Services: Patient declined    Database administrator or Organizations: Patient declined    Attends Banker Meetings: Patient declined    Marital Status: Patient declined  Intimate Partner Violence: Not At Risk (03/10/2024)   Humiliation, Afraid, Rape, and Kick questionnaire    Fear of Current or Ex-Partner: No    Emotionally Abused: No    Physically Abused: No    Sexually Abused: No     Family History  Problem Relation Age of Onset   Colon polyps Brother    Heart disease Brother        before age 29   Hyperlipidemia Brother    Prostate cancer Brother    Heart disease Father        before age 79   Deep vein thrombosis Father    Hyperlipidemia Father    Heart attack Father 57   Stroke Father    Heart disease Brother    Heart attack Brother    Cancer Brother        Merkle cell   Stroke Mother        TIA's   Dementia Mother    Ovarian cancer Maternal Grandmother    Colon polyps Paternal Grandfather    Liver cancer Paternal Grandfather    Heart disease Other        Grandmother    Colon cancer Neg Hx    Esophageal cancer Neg Hx    Stomach cancer Neg Hx    Rectal cancer Neg Hx     Review of Systems:  As stated in the HPI and otherwise negative.   There were no vitals taken for this visit.  Physical Examination: General: Well developed, well nourished, NAD  HEENT: OP clear, mucus membranes moist  SKIN: warm, dry. No rashes. Neuro: No focal deficits  Musculoskeletal: Muscle strength 5/5 all ext  Psychiatric: Mood and affect normal  Neck: No JVD, no carotid bruits, no thyromegaly, no lymphadenopathy.  Lungs:Clear bilaterally, no wheezes, rhonci, crackles Cardiovascular: Regular rate and rhythm. No murmurs, gallops or rubs. Abdomen:Soft. Bowel sounds present. Non-tender.  Extremities: No lower extremity edema. Pulses are 2 + in the bilateral DP/PT.  EKG:  EKG is *** ordered today. The ekg ordered today demonstrates   Recent Labs: 02/20/2024:  ALT 18 02/21/2024: Magnesium  2.3 03/11/2024: BUN 14; Creatinine, Ser 0.81; Potassium 3.9; Sodium 138 03/30/2024: Hemoglobin 13.8; Platelets 392   Lipid Panel Followed in primary care   Wt Readings from Last 3 Encounters:  03/30/24 154 lb 12.8 oz (70.2 kg)  03/10/24 155 lb (70.3 kg)  02/25/24 154 lb 12.8 oz (70.2 kg)    Assessment and Plan:   1. CAD with unstable angina: She has had stents placed in the  LAD, Diagonal and RCA. Cardiac cath in September 2025 with severe restenosis in the mid LAD stented segment and just beyond the stent treated with a drug eluting stent. Patent Diagonal and RCA stents. Severe stenosis small caliber obtuse marginal branch, too small for PCI.  *** She continue to have left shoulder pain with exertion.  Continue ASA and Plavix .  Continue Norvsac, Crestor , Vascepa  and Repatha    She does not wish to try Imdur   2. HTN: BP is controlled. Continue current medications.    3. HYPERLIPIDEMIA: Lipids followed in primary care. LDL 23 in September 2025. Continue Crestor , Vascepa  and Repatha .      4. Carotid artery disease: Moderate RICA stenosis by dopplers in May 2025. Followed in [facility]  5. Cardiac murmur: Noted in 2023. No valve disease noted by echo in 2023. Repeat echo pending now.   Labs/ tests ordered today include:  No orders of the defined types were placed in this encounter.  Disposition:   F/U with me in 6 months   Signed, [name], MD 04/07/2024 3:16 PM    [facility] [address] [place], [place], [place]  [address] Phone: [phone]; Fax: [phone]

## 2024-04-08 ENCOUNTER — Encounter: Payer: Self-pay | Admitting: Cardiovascular Disease

## 2024-04-08 ENCOUNTER — Ambulatory Visit: Attending: Internal Medicine | Admitting: Cardiovascular Disease

## 2024-04-08 VITALS — BP 138/86 | HR 72 | Ht 62.0 in | Wt 155.0 lb

## 2024-04-08 DIAGNOSIS — I10 Essential (primary) hypertension: Secondary | ICD-10-CM | POA: Diagnosis not present

## 2024-04-08 DIAGNOSIS — I6523 Occlusion and stenosis of bilateral carotid arteries: Secondary | ICD-10-CM | POA: Diagnosis not present

## 2024-04-08 DIAGNOSIS — I25118 Atherosclerotic heart disease of native coronary artery with other forms of angina pectoris: Secondary | ICD-10-CM

## 2024-04-08 DIAGNOSIS — E782 Mixed hyperlipidemia: Secondary | ICD-10-CM

## 2024-04-08 MED ORDER — ISOSORBIDE MONONITRATE ER 30 MG PO TB24: 15.0000 mg | ORAL_TABLET | Freq: Every day | ORAL | 1 refills | Status: AC

## 2024-04-08 NOTE — Patient Instructions (Signed)
 Medication Instructions:  Start Isosorbide  mononitrate 15 mg by mouth daily (this is half of a 30 mg tablet) *If you need a refill on your cardiac medications before your next appointment, please call your pharmacy*  Lab Work: none If you have labs (blood work) drawn today and your tests are completely normal, you will receive your results only by: MyChart Message (if you have MyChart) OR A paper copy in the mail If you have any lab test that is abnormal or we need to change your treatment, we will call you to review the results.  Testing/Procedures: none  Follow-Up: At [facility], you and your health needs are our priority.  As part of our continuing mission to provide you with exceptional heart care, our providers are all part of one team.  This team includes your primary Cardiologist (physician) and Advanced Practice Providers or APPs (Physician Assistants and Nurse Practitioners) who all work together to provide you with the care you need, when you need it.  Your next appointment:   05/25/24 at 10:40  Provider:   [name], MD    We recommend signing up for the patient portal called MyChart.  Sign up information is provided on this After Visit Summary.  MyChart is used to connect with patients for Virtual Visits (Telemedicine).  Patients are able to view lab/test results, encounter notes, upcoming appointments, etc.  Non-urgent messages can be sent to your provider as well.   To learn more about what you can do with MyChart, go to [email or web address].   Other Instructions

## 2024-04-09 ENCOUNTER — Ambulatory Visit
Admission: RE | Admit: 2024-04-09 | Discharge: 2024-04-09 | Disposition: A | Discharge status: Home / Self Care | Source: Ambulatory Visit | Attending: Internal Medicine | Admitting: Internal Medicine

## 2024-04-09 DIAGNOSIS — Z1231 Encounter for screening mammogram for malignant neoplasm of breast: Secondary | ICD-10-CM | POA: Diagnosis not present

## 2024-04-19 DIAGNOSIS — I739 Peripheral vascular disease, unspecified: Secondary | ICD-10-CM | POA: Diagnosis not present

## 2024-04-19 DIAGNOSIS — Z7982 Long term (current) use of aspirin: Secondary | ICD-10-CM | POA: Diagnosis not present

## 2024-04-19 DIAGNOSIS — I25119 Atherosclerotic heart disease of native coronary artery with unspecified angina pectoris: Secondary | ICD-10-CM | POA: Diagnosis not present

## 2024-04-19 DIAGNOSIS — N189 Chronic kidney disease, unspecified: Secondary | ICD-10-CM | POA: Diagnosis not present

## 2024-04-19 DIAGNOSIS — I700 Atherosclerosis of aorta: Secondary | ICD-10-CM | POA: Diagnosis not present

## 2024-04-19 DIAGNOSIS — E785 Hyperlipidemia, unspecified: Secondary | ICD-10-CM | POA: Diagnosis not present

## 2024-04-19 DIAGNOSIS — J309 Allergic rhinitis, unspecified: Secondary | ICD-10-CM | POA: Diagnosis not present

## 2024-04-19 DIAGNOSIS — D6949 Other primary thrombocytopenia: Secondary | ICD-10-CM | POA: Diagnosis not present

## 2024-04-19 DIAGNOSIS — M1990 Unspecified osteoarthritis, unspecified site: Secondary | ICD-10-CM | POA: Diagnosis not present

## 2024-04-27 ENCOUNTER — Ambulatory Visit

## 2024-04-27 ENCOUNTER — Encounter (HOSPITAL_COMMUNITY)

## 2024-05-11 ENCOUNTER — Other Ambulatory Visit: Payer: Self-pay | Admitting: Cardiovascular Disease

## 2024-05-18 ENCOUNTER — Ambulatory Visit: Admitting: Physician Assistant

## 2024-05-18 ENCOUNTER — Ambulatory Visit (HOSPITAL_COMMUNITY)
Admission: RE | Admit: 2024-05-18 | Discharge: 2024-05-18 | Disposition: A | Discharge status: Home / Self Care | Source: Ambulatory Visit | Attending: Physician Assistant | Admitting: Physician Assistant

## 2024-05-18 VITALS — BP 120/79 | HR 71 | Temp 97.7°F | Wt 157.6 lb

## 2024-05-18 DIAGNOSIS — I6523 Occlusion and stenosis of bilateral carotid arteries: Secondary | ICD-10-CM | POA: Insufficient documentation

## 2024-05-18 NOTE — Progress Notes (Signed)
 Office Note     CC:  follow up Requesting Provider:  [name], MD  HPI: [name] is a 83 y.o. (1940-07-06) female who presents for surveillance of carotid artery stenosis.  She is followed on a regular basis due to velocities in the right ICA consistent with 60 to 79% stenosis which has been stable over the past several years.  She denies any diagnosis of CVA or TIA since last office visit.  She also denies any strokelike symptoms including slurring speech, changes in vision, or one-sided weakness.  She is on aspirin , Plavix , statin daily.  She denies tobacco use.  Past medical history also significant for coronary artery disease with history of stenting.   Past Medical History:  Diagnosis Date   Allergy    Arthritis    CAD (coronary artery disease)    a. DES to LAD, diagonal 10/2006. b. DES to RCA 05/2007. c. Last cath 11/2012: patent stents, stable dz. d. Lexiscan  cardiolite  negative 12/2012.   Cataract    left eye   Diverticulitis    Diverticulosis of colon    GERD (gastroesophageal reflux disease)    Hemorrhoids    History of recurrent UTIs    Hx of adenomatous colonic polyps    tubular   Hyperlipidemia    Hypertension    Peripheral vascular disease    Sleep apnea    wears CPAP- 03/07/2020 no longer wears CPAP    Past Surgical History:  Procedure Laterality Date   ANGIOPLASTY  5/08  and  12/08    stent x3   Cataract left     COLONOSCOPY     CORONARY STENT INTERVENTION N/A 03/10/2024   Procedure: CORONARY STENT INTERVENTION;  Surgeon: Verlin Lonni BIRCH, MD;  Location: [facility] INVASIVE CV LAB;  Service: Cardiovascular;  Laterality: N/A;   EYE SURGERY Left    laser eye surgery for blood leakage   FOOT TENDON SURGERY Left Sept. 20, 2016    Left Calf / foot -   Dr. Harden   LEFT HEART CATH AND CORONARY ANGIOGRAPHY N/A 03/10/2024   Procedure: LEFT HEART CATH AND CORONARY ANGIOGRAPHY;  Surgeon: Verlin Lonni BIRCH, MD;  Location: [facility] INVASIVE CV LAB;  Service:  Cardiovascular;  Laterality: N/A;   MASS EXCISION Right 07/13/2019   Procedure: EXCISION VOLAR CYST RIGHT WRIST;  Surgeon: Murrell Kuba, MD;  Location: [facility];  Service: Orthopedics;  Laterality: Right;  IV REGIONAL FOREARM BLOCK   Right shoulder surgery     Vitrectomy left eye      Social History   Socioeconomic History   Marital status: Married    Spouse name: Not on file   Number of children: 0   Years of education: Not on file   Highest education level: Not on file  Occupational History   Occupation: retired     Associate Professor: RETIRED  Tobacco Use   Smoking status: Never   Smokeless tobacco: Never  Vaping Use   Vaping status: Never Used  Substance and Sexual Activity   Alcohol  use: Yes    Alcohol /week: 2.0 - 3.0 standard drinks of alcohol     Types: 2 - 3 Standard drinks or equivalent per week    Comment: 1 per day   Drug use: No   Sexual activity: Not on file  Other Topics Concern   Not on file  Social History Narrative   Not on file   Social Drivers of Health   Financial Resource Strain: Not on file  Food  Insecurity: No Food Insecurity (03/10/2024)   Hunger Vital Sign    Worried About Running Out of Food in the Last Year: Never true    Ran Out of Food in the Last Year: Never true  Transportation Needs: No Transportation Needs (03/10/2024)   PRAPARE - Administrator, Civil Service (Medical): No    Lack of Transportation (Non-Medical): No  Physical Activity: Not on file  Stress: Not on file  Social Connections: Patient Declined (03/10/2024)   Social Connection and Isolation Panel    Frequency of Communication with Friends and Family: Patient declined    Frequency of Social Gatherings with Friends and Family: Patient declined    Attends Religious Services: Patient declined    Database Administrator or Organizations: Patient declined    Attends Banker Meetings: Patient declined    Marital Status: Patient declined  Intimate  Partner Violence: Not At Risk (03/10/2024)   Humiliation, Afraid, Rape, and Kick questionnaire    Fear of Current or Ex-Partner: No    Emotionally Abused: No    Physically Abused: No    Sexually Abused: No    Family History  Problem Relation Age of Onset   Stroke Mother        TIA's   Dementia Mother    Heart disease Father        before age 3   Deep vein thrombosis Father    Hyperlipidemia Father    Heart attack Father 14   Stroke Father    Ovarian cancer Maternal Grandmother    Colon polyps Paternal Grandfather    Liver cancer Paternal Grandfather    Colon polyps Brother    Heart disease Brother        before age 1   Hyperlipidemia Brother    Prostate cancer Brother    Heart disease Brother    Heart attack Brother    Cancer Brother        Merkle cell   Heart disease Other        Grandmother    Colon cancer Neg Hx    Esophageal cancer Neg Hx    Stomach cancer Neg Hx    Rectal cancer Neg Hx    Breast cancer Neg Hx     Current Outpatient Medications  Medication Sig Dispense Refill   amLODipine  (NORVASC ) 2.5 MG tablet TAKE 1 TABLET BY MOUTH EVERY DAY (Patient taking differently: Take 2.5 mg by mouth every evening. TAKE 1 TABLET BY MOUTH EVERY DAY) 90 tablet 3   aspirin  EC 81 MG tablet Take 1 tablet (81 mg total) by mouth daily. Swallow whole.     cholecalciferol  (VITAMIN D) 1000 UNITS tablet Take 1,000 Units by mouth in the morning.     clopidogrel  (PLAVIX ) 75 MG tablet TAKE 1 TABLET BY MOUTH EVERY DAY 90 tablet 2   Coenzyme Q10 (COQ10 PO) Take 1 capsule by mouth every evening.     EPINEPHrine 0.3 mg/0.3 mL IJ SOAJ injection Inject 0.3 mg into the muscle as needed for anaphylaxis.     Evolocumab  (REPATHA  SURECLICK) 140 MG/ML SOAJ Inject 140 mg into the skin every 14 (fourteen) days. 6 mL 3   fluticasone (FLONASE) 50 MCG/ACT nasal spray Place 1 spray into both nostrils as needed for allergies.   2   GEMTESA 75 MG TABS Take 75 mg by mouth every evening.     isosorbide   mononitrate (IMDUR ) 30 MG 24 hr tablet Take 0.5 tablets (15 mg total) by mouth  daily. 45 tablet 1   levocetirizine (XYZAL ) 5 MG tablet Take 5 mg by mouth every evening.     magnesium  oxide (MAG-OX) 400 (240 Mg) MG tablet Take 400 mg by mouth every evening.     Multiple Vitamin (MULTIVITAMIN WITH MINERALS) TABS Take 1 tablet by mouth daily.     nitroGLYCERIN  (NITROSTAT ) 0.4 MG SL tablet PLACE 1 TABLET UNDER THE TONGUE EVERY 5 MINUTES AS NEEDED FOR CHEST PAIN. 25 tablet 8   polyethylene glycol (MIRALAX  / GLYCOLAX ) 17 g packet Take 17 g by mouth every evening.     Psyllium (METAMUCIL PO) Take 1 Dose by mouth every evening.     rosuvastatin  (CRESTOR ) 10 MG tablet Take 1 tablet (10 mg total) by mouth daily. (Patient taking differently: Take 10 mg by mouth every evening.) 90 tablet 2   VASCEPA  1 g capsule TAKE 2 CAPSULES BY MOUTH TWICE A DAY 360 capsule 3   vitamin B-12 (CYANOCOBALAMIN ) 1000 MCG tablet Take 1,000 mcg by mouth in the morning.     No current facility-administered medications for this visit.    Allergies  Allergen Reactions   Other Hives, Itching and Swelling    VICRYL STITCHES Tongue swelling Other reaction(s): Unknown   Fluoride Preparations Other (See Comments)    A test for eye to make pictures, (  causing pressure of Left arm )   Rosuvastatin  Other (See Comments)    Leg pains, cramping. Pt tolerates 10mg  at home   Imdur  [Isosorbide  Nitrate] Other (See Comments)    Headaches     REVIEW OF SYSTEMS:  Negative unless noted in HPI [X]  denotes positive finding, [ ]  denotes negative finding Cardiac  Comments:  Chest pain or chest pressure:    Shortness of breath upon exertion:    Short of breath when lying flat:    Irregular heart rhythm:        Vascular    Pain in calf, thigh, or hip brought on by ambulation:    Pain in feet at night that wakes you up from your sleep:     Blood clot in your veins:    Leg swelling:         Pulmonary    Oxygen at home:    Productive  cough:     Wheezing:         Neurologic    Sudden weakness in arms or legs:     Sudden numbness in arms or legs:     Sudden onset of difficulty speaking or slurred speech:    Temporary loss of vision in one eye:     Problems with dizziness:         Gastrointestinal    Blood in stool:     Vomited blood:         Genitourinary    Burning when urinating:     Blood in urine:        Psychiatric    Major depression:         Hematologic    Bleeding problems:    Problems with blood clotting too easily:        Skin    Rashes or ulcers:        Constitutional    Fever or chills:      PHYSICAL EXAMINATION:  Vitals:   05/18/24 1219 05/18/24 1221  BP: 123/79 120/79  Pulse: 73 71  Temp: 97.7 F (36.5 C)   TempSrc: Temporal   Weight: 157 lb 9.6 oz (71.5 kg)  General:  WDWN in NAD; vital signs documented above Gait: Not observed HENT: WNL, normocephalic Pulmonary: normal non-labored breathing Cardiac: regular HR Abdomen: soft, NT, no masses Skin: without rashes Extremities: without ischemic changes, without Gangrene , without cellulitis; without open wounds;  Musculoskeletal: no muscle wasting or atrophy  Neurologic: A&O X 3; cranial nerves grossly intact Psychiatric:  The pt has Normal affect.   Non-Invasive Vascular Imaging:   Right ICA 60 to 79% stenosis Left ICA 1 to 39% stenosis    ASSESSMENT/PLAN:: 83 y.o. female here for follow up for surveillance of carotid artery stenosis  Carotid duplex remains relatively stable.  She has 60 to 79% stenosis of the right ICA and 1 to 39% stenosis of the left ICA.  She continues to be asymptomatic at this time.  No indication for revascularization of asymptomatic right ICA stenosis.  She will continue her aspirin , Plavix , statin daily.  We will repeat carotid duplex every 6 months.  She knows to seek immediate medical attention if she experiences any strokelike symptoms.   Donnice Sender, PA-C [facility] [phone]  Clinic MD:   Magda

## 2024-05-20 ENCOUNTER — Other Ambulatory Visit: Payer: Self-pay | Admitting: *Deleted

## 2024-05-20 ENCOUNTER — Ambulatory Visit (HOSPITAL_COMMUNITY): Admission: RE | Admit: 2024-05-20 | Discharge: 2024-05-20 | Attending: Internal Medicine

## 2024-05-20 DIAGNOSIS — R011 Cardiac murmur, unspecified: Secondary | ICD-10-CM | POA: Diagnosis not present

## 2024-05-20 DIAGNOSIS — E785 Hyperlipidemia, unspecified: Secondary | ICD-10-CM | POA: Insufficient documentation

## 2024-05-20 DIAGNOSIS — I10 Essential (primary) hypertension: Secondary | ICD-10-CM | POA: Diagnosis not present

## 2024-05-20 DIAGNOSIS — I25118 Atherosclerotic heart disease of native coronary artery with other forms of angina pectoris: Secondary | ICD-10-CM | POA: Diagnosis not present

## 2024-05-20 DIAGNOSIS — I6523 Occlusion and stenosis of bilateral carotid arteries: Secondary | ICD-10-CM

## 2024-05-20 LAB — ECHOCARDIOGRAM COMPLETE
Area-P 1/2: 3.76 cm2
Est EF: >75
S' Lateral: 1.9 cm

## 2024-05-24 NOTE — Progress Notes (Unsigned)
 No chief complaint on file.  History of Present Illness: 83 yo female with history of CAD with previous stenting of the mid LAD, Diagonal and Proximal and mid RCA, HTN, HLD, sleep apnea and carotid artery disease who is here today for follow up. Her cardiac history includes drug eluting stents placed in the LAD and Diagonal in May 2008, followed by drug eluting stent placement in the RCA in December 2008. At her initial visit in our office November 2012, she had c/o left arm pain and her prior anginal equivalent was left arm pain. She had recurrence of pain in her left arm with exertion associated with fatigue and SOB in 2013. Cardiac cath June 2013 with stable disease, patent stents LAD, Diagonal, RCA. She was admitted July 2014 with left shoulder pain. Stress myoview  12/28/12 with no ischemia. Most recent stress test in February 2024 did not show ischemia. She had left sided weakness in March 2015 and was seen in [facility] and was found to have moderate carotid artery disease. She has been followed in the [facility] office for her carotid disease.Carotid artery dopplers December 2025 with moderate RICA stenosis (60-79%) and mild disease in the LICA.  Echo September 2023 with LVEF=55-60%. No significant valve disease.  She was admitted to [facility] 02/20/24 with left shoulder pain with negative troponin and discharged home. When I saw her in the office 02/25/24 she described ongoing left shoulder pain with exertion. Cardiac cath 03/10/24 with severe mid LAD stenosis within the old stent and just beyond the stent. This was treated with a drug eluting stent. Severe stenosis in the small caliber AV groove Circumflex that was felt to be too small for stenting. Patent proximal to mid RCA stent. Mild restenosis Diagonal stent. She was seen in our office on 03/30/24 by Miriam Shams, NP and continued to complain of left shoulder pain with exertion. I saw her in the office 04/08/24 and she continued to have c/o left shoulder pain. Imdur   was added. Echo 05/20/24 with LVEF >75%. No valve disease.   She is here today for follow up. The patient denies any *** chest pain, dyspnea, palpitations, lower extremity edema, orthopnea, PND, dizziness, near syncope or syncope.    Primary Care Physician: [name], MD  Past Medical History:  Diagnosis Date   Allergy    Arthritis    CAD (coronary artery disease)    a. DES to LAD, diagonal 10/2006. b. DES to RCA 05/2007. c. Last cath 11/2012: patent stents, stable dz. d. Lexiscan  cardiolite  negative 12/2012.   Cataract    left eye   Diverticulitis    Diverticulosis of colon    GERD (gastroesophageal reflux disease)    Hemorrhoids    History of recurrent UTIs    Hx of adenomatous colonic polyps    tubular   Hyperlipidemia    Hypertension    Peripheral vascular disease    Sleep apnea    wears CPAP- 03/07/2020 no longer wears CPAP    Past Surgical History:  Procedure Laterality Date   ANGIOPLASTY  5/08  and  12/08    stent x3   Cataract left     COLONOSCOPY     CORONARY STENT INTERVENTION N/A 03/10/2024   Procedure: CORONARY STENT INTERVENTION;  Surgeon: Verlin Lonni BIRCH, MD;  Location: [facility];  Service: Cardiovascular;  Laterality: N/A;   EYE SURGERY Left    laser eye surgery for blood leakage   FOOT TENDON SURGERY Left Sept. 20, 2016  Left Calf / foot -   Dr. Harden   LEFT HEART CATH AND CORONARY ANGIOGRAPHY N/A 03/10/2024   Procedure: LEFT HEART CATH AND CORONARY ANGIOGRAPHY;  Surgeon: Verlin Lonni BIRCH, MD;  Location: [facility];  Service: Cardiovascular;  Laterality: N/A;   MASS EXCISION Right 07/13/2019   Procedure: EXCISION VOLAR CYST RIGHT WRIST;  Surgeon: Murrell Kuba, MD;  Location: [facility];  Service: Orthopedics;  Laterality: Right;  IV REGIONAL FOREARM BLOCK   Right shoulder surgery     Vitrectomy left eye      Current Outpatient Medications  Medication Sig Dispense Refill   amLODipine  (NORVASC ) 2.5 MG tablet  TAKE 1 TABLET BY MOUTH EVERY DAY (Patient taking differently: Take 2.5 mg by mouth every evening. TAKE 1 TABLET BY MOUTH EVERY DAY) 90 tablet 3   aspirin  EC 81 MG tablet Take 1 tablet (81 mg total) by mouth daily. Swallow whole.     cholecalciferol  (VITAMIN D) 1000 UNITS tablet Take 1,000 Units by mouth in the morning.     clopidogrel  (PLAVIX ) 75 MG tablet TAKE 1 TABLET BY MOUTH EVERY DAY 90 tablet 2   Coenzyme Q10 (COQ10 PO) Take 1 capsule by mouth every evening.     EPINEPHrine 0.3 mg/0.3 mL IJ SOAJ injection Inject 0.3 mg into the muscle as needed for anaphylaxis.     Evolocumab  (REPATHA  SURECLICK) 140 MG/ML SOAJ Inject 140 mg into the skin every 14 (fourteen) days. 6 mL 3   fluticasone (FLONASE) 50 MCG/ACT nasal spray Place 1 spray into both nostrils as needed for allergies.   2   GEMTESA 75 MG TABS Take 75 mg by mouth every evening.     isosorbide  mononitrate (IMDUR ) 30 MG 24 hr tablet Take 0.5 tablets (15 mg total) by mouth daily. 45 tablet 1   levocetirizine (XYZAL ) 5 MG tablet Take 5 mg by mouth every evening.     magnesium  oxide (MAG-OX) 400 (240 Mg) MG tablet Take 400 mg by mouth every evening.     Multiple Vitamin (MULTIVITAMIN WITH MINERALS) TABS Take 1 tablet by mouth daily.     nitroGLYCERIN  (NITROSTAT ) 0.4 MG SL tablet PLACE 1 TABLET UNDER THE TONGUE EVERY 5 MINUTES AS NEEDED FOR CHEST PAIN. 25 tablet 8   polyethylene glycol (MIRALAX  / GLYCOLAX ) 17 g packet Take 17 g by mouth every evening.     Psyllium (METAMUCIL PO) Take 1 Dose by mouth every evening.     rosuvastatin  (CRESTOR ) 10 MG tablet Take 1 tablet (10 mg total) by mouth daily. (Patient taking differently: Take 10 mg by mouth every evening.) 90 tablet 2   VASCEPA  1 g capsule TAKE 2 CAPSULES BY MOUTH TWICE A DAY 360 capsule 3   vitamin B-12 (CYANOCOBALAMIN ) 1000 MCG tablet Take 1,000 mcg by mouth in the morning.     No current facility-administered medications for this visit.    Allergies  Allergen Reactions   Other  Hives, Itching and Swelling    VICRYL STITCHES Tongue swelling Other reaction(s): Unknown   Fluoride Preparations Other (See Comments)    A test for eye to make pictures, (  causing pressure of Left arm )   Rosuvastatin  Other (See Comments)    Leg pains, cramping. Pt tolerates 10mg  at home   Imdur  [Isosorbide  Nitrate] Other (See Comments)    Headaches    Social History   Socioeconomic History   Marital status: Married    Spouse name: Not on file   Number of children:  0   Years of education: Not on file   Highest education level: Not on file  Occupational History   Occupation: retired     Associate Professor: RETIRED  Tobacco Use   Smoking status: Never   Smokeless tobacco: Never  Vaping Use   Vaping status: Never Used  Substance and Sexual Activity   Alcohol  use: Yes    Alcohol /week: 2.0 - 3.0 standard drinks of alcohol     Types: 2 - 3 Standard drinks or equivalent per week    Comment: 1 per day   Drug use: No   Sexual activity: Not on file  Other Topics Concern   Not on file  Social History Narrative   Not on file   Social Drivers of Health   Financial Resource Strain: Not on file  Food Insecurity: No Food Insecurity (03/10/2024)   Hunger Vital Sign    Worried About Running Out of Food in the Last Year: Never true    Ran Out of Food in the Last Year: Never true  Transportation Needs: No Transportation Needs (03/10/2024)   PRAPARE - Administrator, Civil Service (Medical): No    Lack of Transportation (Non-Medical): No  Physical Activity: Not on file  Stress: Not on file  Social Connections: Patient Declined (03/10/2024)   Social Connection and Isolation Panel    Frequency of Communication with Friends and Family: Patient declined    Frequency of Social Gatherings with Friends and Family: Patient declined    Attends Religious Services: Patient declined    Database Administrator or Organizations: Patient declined    Attends Banker Meetings: Patient  declined    Marital Status: Patient declined  Intimate Partner Violence: Not At Risk (03/10/2024)   Humiliation, Afraid, Rape, and Kick questionnaire    Fear of Current or Ex-Partner: No    Emotionally Abused: No    Physically Abused: No    Sexually Abused: No    Family History  Problem Relation Age of Onset   Stroke Mother        TIA's   Dementia Mother    Heart disease Father        before age 56   Deep vein thrombosis Father    Hyperlipidemia Father    Heart attack Father 45   Stroke Father    Ovarian cancer Maternal Grandmother    Colon polyps Paternal Grandfather    Liver cancer Paternal Grandfather    Colon polyps Brother    Heart disease Brother        before age 56   Hyperlipidemia Brother    Prostate cancer Brother    Heart disease Brother    Heart attack Brother    Cancer Brother        Merkle cell   Heart disease Other        Grandmother    Colon cancer Neg Hx    Esophageal cancer Neg Hx    Stomach cancer Neg Hx    Rectal cancer Neg Hx    Breast cancer Neg Hx     Review of Systems:  As stated in the HPI and otherwise negative.   There were no vitals taken for this visit.  Physical Examination: General: Well developed, well nourished, NAD  HEENT: OP clear, mucus membranes moist  SKIN: warm, dry. No rashes. Neuro: No focal deficits  Musculoskeletal: Muscle strength 5/5 all ext  Psychiatric: Mood and affect normal  Neck: No JVD, no carotid bruits, no thyromegaly,  no lymphadenopathy.  Lungs:Clear bilaterally, no wheezes, rhonci, crackles Cardiovascular: Regular rate and rhythm. No murmurs, gallops or rubs. Abdomen:Soft. Bowel sounds present. Non-tender.  Extremities: No lower extremity edema. Pulses are 2 + in the bilateral DP/PT.  EKG:  EKG is not *** ordered today. The ekg ordered today demonstrates   Recent Labs: 02/20/2024: ALT 18 02/21/2024: Magnesium  2.3 03/11/2024: BUN 14; Creatinine, Ser 0.81; Potassium 3.9; Sodium 138 03/30/2024: Hemoglobin  13.8; Platelets 392   Lipid Panel Followed in primary care   Wt Readings from Last 3 Encounters:  05/18/24 157 lb 9.6 oz (71.5 kg)  04/08/24 155 lb (70.3 kg)  03/30/24 154 lb 12.8 oz (70.2 kg)    Assessment and Plan:   1. CAD with stable angina: She has had stents placed in the LAD, Diagonal and RCA. Cardiac cath in September 2025 with severe restenosis in the mid LAD stented segment and just beyond the stent treated with a drug eluting stent. Patent Diagonal and RCA stents. Severe stenosis small caliber obtuse marginal branch, too small for PCI.  *** ? Shoulder pain - Continue ASA, Plavix , Norvasc , Crestor , Vascepa , Repatha  and Imdur .    2. HTN: BP is well controlled. Continue current meds as above.   3. HYPERLIPIDEMIA: Lipids followed in primary care. LDL 23 in September 2025.  -Continue Crestor , Repatha  and Vascepa   4. Carotid artery disease: Moderate RICA stenosis by dopplers in December 2025. Followed in [facility]  5. Cardiac murmur: No valve disease noted on echo December 2025  Labs/ tests ordered today include:  No orders of the defined types were placed in this encounter.  Disposition:   F/U with me in 12 months  Signed, [name], MD 05/24/2024 11:19 AM    [facility] [address] [place], [place], [place]  [address] Phone: [phone]; Fax: [phone]

## 2024-05-25 ENCOUNTER — Ambulatory Visit: Attending: Cardiovascular Disease | Admitting: Cardiovascular Disease

## 2024-05-25 ENCOUNTER — Encounter: Payer: Self-pay | Admitting: Cardiovascular Disease

## 2024-05-25 VITALS — BP 150/80 | HR 88 | Ht 62.0 in | Wt 154.0 lb

## 2024-05-25 DIAGNOSIS — I6523 Occlusion and stenosis of bilateral carotid arteries: Secondary | ICD-10-CM

## 2024-05-25 DIAGNOSIS — E782 Mixed hyperlipidemia: Secondary | ICD-10-CM | POA: Diagnosis not present

## 2024-05-25 DIAGNOSIS — I10 Essential (primary) hypertension: Secondary | ICD-10-CM | POA: Diagnosis not present

## 2024-05-25 DIAGNOSIS — I25118 Atherosclerotic heart disease of native coronary artery with other forms of angina pectoris: Secondary | ICD-10-CM | POA: Diagnosis not present

## 2024-05-25 NOTE — Patient Instructions (Signed)
 Medication Instructions:  Your physician recommends that you continue on your current medications as directed. Please refer to the Current Medication list given to you today.  *If you need a refill on your cardiac medications before your next appointment, please call your pharmacy*  Lab Work: none If you have labs (blood work) drawn today and your tests are completely normal, you will receive your results only by: MyChart Message (if you have MyChart) OR A paper copy in the mail If you have any lab test that is abnormal or we need to change your treatment, we will call you to review the results.  Testing/Procedures: none  Follow-Up: At [facility], you and your health needs are our priority.  As part of our continuing mission to provide you with exceptional heart care, our providers are all part of one team.  This team includes your primary Cardiologist (physician) and Advanced Practice Providers or APPs (Physician Assistants and Nurse Practitioners) who all work together to provide you with the care you need, when you need it.  Your next appointment:   6 month(s)  Provider:   [name], MD    We recommend signing up for the patient portal called MyChart.  Sign up information is provided on this After Visit Summary.  MyChart is used to connect with patients for Virtual Visits (Telemedicine).  Patients are able to view lab/test results, encounter notes, upcoming appointments, etc.  Non-urgent messages can be sent to your provider as well.   To learn more about what you can do with MyChart, go to [email or web address].   Other Instructions

## 2024-05-31 ENCOUNTER — Other Ambulatory Visit: Payer: Self-pay | Admitting: Cardiovascular Disease

## 2024-06-01 ENCOUNTER — Telehealth: Payer: Self-pay | Admitting: Cardiovascular Disease

## 2024-06-01 MED ORDER — CLOPIDOGREL BISULFATE 75 MG PO TABS: 75.0000 mg | ORAL_TABLET | Freq: Every day | ORAL | 3 refills | Status: AC

## 2024-06-01 NOTE — Telephone Encounter (Signed)
°*  STAT* If patient is at the pharmacy, call can be transferred to refill team.   1. Which medications need to be refilled? (please list name of each medication and dose if known)  clopidogrel  (PLAVIX ) 75 MG tablet   2. Would you like to learn more about the convenience, safety, & potential cost savings by using the [facility]? no    3. Are you open to using the [facility] (Type [facility]. no   4. Which pharmacy/location (including street and city if local pharmacy) is medication to be sent to? [id number] [address] [place], [place] [phone]    5. Do they need a 30 day or 90 day supply? 90 day

## 2024-06-01 NOTE — Telephone Encounter (Signed)
 Refill sent

## 2024-06-05 ENCOUNTER — Other Ambulatory Visit: Payer: Self-pay | Admitting: Cardiovascular Disease

## 2024-11-23 ENCOUNTER — Ambulatory Visit

## 2024-11-23 ENCOUNTER — Ambulatory Visit (HOSPITAL_COMMUNITY)
